# Patient Record
Sex: Female | Born: 1950
Health system: Southern US, Community
[De-identification: ages and names within clinical notes are randomized; demographics above are authoritative.]

## PROBLEM LIST (undated history)

## (undated) DIAGNOSIS — H8109 Meniere's disease, unspecified ear: Secondary | ICD-10-CM

## (undated) DIAGNOSIS — I10 Essential (primary) hypertension: Secondary | ICD-10-CM

## (undated) DIAGNOSIS — E559 Vitamin D deficiency, unspecified: Secondary | ICD-10-CM

## (undated) DIAGNOSIS — F419 Anxiety disorder, unspecified: Secondary | ICD-10-CM

## (undated) DIAGNOSIS — I214 Non-ST elevation (NSTEMI) myocardial infarction: Secondary | ICD-10-CM

## (undated) DIAGNOSIS — R002 Palpitations: Secondary | ICD-10-CM

## (undated) DIAGNOSIS — R202 Paresthesia of skin: Secondary | ICD-10-CM

## (undated) DIAGNOSIS — G4730 Sleep apnea, unspecified: Secondary | ICD-10-CM

## (undated) DIAGNOSIS — Z8669 Personal history of other diseases of the nervous system and sense organs: Secondary | ICD-10-CM

## (undated) DIAGNOSIS — J45909 Unspecified asthma, uncomplicated: Secondary | ICD-10-CM

## (undated) DIAGNOSIS — E039 Hypothyroidism, unspecified: Secondary | ICD-10-CM

## (undated) DIAGNOSIS — R011 Cardiac murmur, unspecified: Secondary | ICD-10-CM

## (undated) DIAGNOSIS — Z872 Personal history of diseases of the skin and subcutaneous tissue: Secondary | ICD-10-CM

## (undated) DIAGNOSIS — E785 Hyperlipidemia, unspecified: Secondary | ICD-10-CM

## (undated) DIAGNOSIS — I341 Nonrheumatic mitral (valve) prolapse: Secondary | ICD-10-CM

## (undated) DIAGNOSIS — K219 Gastro-esophageal reflux disease without esophagitis: Secondary | ICD-10-CM

## (undated) HISTORY — DX: Sleep apnea, unspecified: G47.30

## (undated) HISTORY — PX: APPENDECTOMY: SHX54

## (undated) HISTORY — PX: BREAST LUMPECTOMY: SHX2

## (undated) HISTORY — DX: Essential (primary) hypertension: I10

## (undated) HISTORY — DX: Nonrheumatic mitral (valve) prolapse: I34.1

## (undated) HISTORY — DX: Vitamin D deficiency, unspecified: E55.9

## (undated) HISTORY — DX: Hypothyroidism, unspecified: E03.9

## (undated) HISTORY — DX: Personal history of diseases of the skin and subcutaneous tissue: Z87.2

## (undated) HISTORY — DX: Hyperlipidemia, unspecified: E78.5

## (undated) HISTORY — DX: Personal history of other diseases of the nervous system and sense organs: Z86.69

## (undated) HISTORY — DX: Palpitations: R00.2

## (undated) HISTORY — DX: Non-ST elevation (NSTEMI) myocardial infarction: I21.4

---

## 1998-05-10 ENCOUNTER — Emergency Department (HOSPITAL_COMMUNITY): Admission: EM | Admit: 1998-05-10 | Discharge: 1998-05-10 | Payer: Self-pay | Admitting: Emergency Medicine

## 2000-06-21 ENCOUNTER — Other Ambulatory Visit: Admission: RE | Admit: 2000-06-21 | Discharge: 2000-06-21 | Payer: Self-pay | Admitting: Internal Medicine

## 2001-09-19 ENCOUNTER — Other Ambulatory Visit: Admission: RE | Admit: 2001-09-19 | Discharge: 2001-09-19 | Payer: Self-pay | Admitting: Internal Medicine

## 2003-01-23 ENCOUNTER — Other Ambulatory Visit: Admission: RE | Admit: 2003-01-23 | Discharge: 2003-01-23 | Payer: Self-pay | Admitting: Internal Medicine

## 2004-04-01 ENCOUNTER — Other Ambulatory Visit: Admission: RE | Admit: 2004-04-01 | Discharge: 2004-04-01 | Payer: Self-pay | Admitting: Neurosurgery

## 2004-08-22 ENCOUNTER — Encounter: Admission: RE | Admit: 2004-08-22 | Discharge: 2004-08-22 | Payer: Self-pay | Admitting: Internal Medicine

## 2005-06-02 ENCOUNTER — Ambulatory Visit: Payer: Self-pay | Admitting: Internal Medicine

## 2005-06-16 ENCOUNTER — Ambulatory Visit: Payer: Self-pay | Admitting: Internal Medicine

## 2005-06-16 ENCOUNTER — Other Ambulatory Visit: Admission: RE | Admit: 2005-06-16 | Discharge: 2005-06-16 | Payer: Self-pay | Admitting: Internal Medicine

## 2005-10-19 ENCOUNTER — Ambulatory Visit: Payer: Self-pay | Admitting: Internal Medicine

## 2006-01-18 ENCOUNTER — Ambulatory Visit: Payer: Self-pay | Admitting: Internal Medicine

## 2006-03-18 ENCOUNTER — Ambulatory Visit: Payer: Self-pay | Admitting: Internal Medicine

## 2006-04-15 ENCOUNTER — Ambulatory Visit: Payer: Self-pay | Admitting: Internal Medicine

## 2006-04-29 ENCOUNTER — Ambulatory Visit (HOSPITAL_BASED_OUTPATIENT_CLINIC_OR_DEPARTMENT_OTHER): Admission: RE | Admit: 2006-04-29 | Discharge: 2006-04-29 | Payer: Self-pay | Admitting: Internal Medicine

## 2006-05-15 ENCOUNTER — Ambulatory Visit: Payer: Self-pay | Admitting: Pulmonary Disease

## 2006-05-17 ENCOUNTER — Ambulatory Visit: Payer: Self-pay | Admitting: Internal Medicine

## 2006-07-02 ENCOUNTER — Ambulatory Visit: Payer: Self-pay | Admitting: Internal Medicine

## 2006-09-07 ENCOUNTER — Ambulatory Visit: Payer: Self-pay | Admitting: Internal Medicine

## 2006-09-14 ENCOUNTER — Ambulatory Visit: Payer: Self-pay | Admitting: Internal Medicine

## 2006-09-14 ENCOUNTER — Encounter: Payer: Self-pay | Admitting: Internal Medicine

## 2006-09-14 ENCOUNTER — Other Ambulatory Visit: Admission: RE | Admit: 2006-09-14 | Discharge: 2006-09-14 | Payer: Self-pay | Admitting: Internal Medicine

## 2006-11-09 ENCOUNTER — Ambulatory Visit: Payer: Self-pay | Admitting: Internal Medicine

## 2006-11-09 LAB — CONVERTED CEMR LAB: Free T4: 1.1 ng/dL (ref 0.9–1.8)

## 2006-11-15 ENCOUNTER — Ambulatory Visit: Payer: Self-pay | Admitting: Internal Medicine

## 2007-02-16 ENCOUNTER — Ambulatory Visit: Payer: Self-pay | Admitting: Internal Medicine

## 2007-02-16 LAB — CONVERTED CEMR LAB: TSH: 4.85 microintl units/mL (ref 0.35–5.50)

## 2007-05-19 ENCOUNTER — Ambulatory Visit: Payer: Self-pay | Admitting: Internal Medicine

## 2007-07-08 DIAGNOSIS — J309 Allergic rhinitis, unspecified: Secondary | ICD-10-CM | POA: Insufficient documentation

## 2007-07-08 DIAGNOSIS — E039 Hypothyroidism, unspecified: Secondary | ICD-10-CM | POA: Insufficient documentation

## 2007-07-14 ENCOUNTER — Encounter: Payer: Self-pay | Admitting: Internal Medicine

## 2007-07-21 ENCOUNTER — Ambulatory Visit: Payer: Self-pay | Admitting: Internal Medicine

## 2007-07-21 DIAGNOSIS — H8109 Meniere's disease, unspecified ear: Secondary | ICD-10-CM | POA: Insufficient documentation

## 2007-07-21 DIAGNOSIS — K219 Gastro-esophageal reflux disease without esophagitis: Secondary | ICD-10-CM | POA: Insufficient documentation

## 2007-07-21 LAB — CONVERTED CEMR LAB: TSH: 1.73 microintl units/mL (ref 0.35–5.50)

## 2007-08-10 ENCOUNTER — Telehealth (INDEPENDENT_AMBULATORY_CARE_PROVIDER_SITE_OTHER): Payer: Self-pay | Admitting: *Deleted

## 2007-08-26 ENCOUNTER — Telehealth: Payer: Self-pay | Admitting: Internal Medicine

## 2007-09-16 ENCOUNTER — Ambulatory Visit: Payer: Self-pay | Admitting: Internal Medicine

## 2007-09-16 LAB — CONVERTED CEMR LAB
ALT: 12 units/L (ref 0–35)
AST: 22 units/L (ref 0–37)
Albumin: 3.7 g/dL (ref 3.5–5.2)
CO2: 31 meq/L (ref 19–32)
Calcium: 9 mg/dL (ref 8.4–10.5)
Cholesterol: 174 mg/dL (ref 0–200)
Creatinine, Ser: 0.8 mg/dL (ref 0.4–1.2)
Eosinophils Absolute: 0.3 10*3/uL (ref 0.0–0.6)
Glucose, Bld: 93 mg/dL (ref 70–99)
HCT: 38.5 % (ref 36.0–46.0)
HDL: 43.1 mg/dL (ref >39.0)
Hemoglobin: 13.3 g/dL (ref 12.0–15.0)
Ketones, urine, test strip: NEGATIVE
LDL Cholesterol: 115 mg/dL — ABNORMAL HIGH (ref 0–99)
Lymphocytes Relative: 29.5 % (ref 12.0–46.0)
Neutrophils Relative %: 58.3 % (ref 43.0–77.0)
Nitrite: NEGATIVE
Platelets: 261 10*3/uL (ref 150–400)
Potassium: 4.5 meq/L (ref 3.5–5.1)
Protein, U semiquant: NEGATIVE
RDW: 12.2 % (ref 11.5–14.6)
Sodium: 144 meq/L (ref 135–145)
TSH: 1.73 microintl units/mL (ref 0.35–5.50)
Total Bilirubin: 0.4 mg/dL (ref 0.3–1.2)
VLDL: 16 mg/dL (ref 0–40)
WBC: 5.4 10*3/uL (ref 4.5–10.5)

## 2007-09-23 ENCOUNTER — Other Ambulatory Visit: Admission: RE | Admit: 2007-09-23 | Discharge: 2007-09-23 | Payer: Self-pay | Admitting: Internal Medicine

## 2007-09-23 ENCOUNTER — Ambulatory Visit: Payer: Self-pay | Admitting: Internal Medicine

## 2007-09-23 ENCOUNTER — Encounter: Payer: Self-pay | Admitting: Internal Medicine

## 2007-09-29 ENCOUNTER — Telehealth: Payer: Self-pay | Admitting: *Deleted

## 2007-10-07 ENCOUNTER — Ambulatory Visit: Payer: Self-pay | Admitting: Gastroenterology

## 2007-10-28 ENCOUNTER — Encounter: Payer: Self-pay | Admitting: Internal Medicine

## 2007-10-28 ENCOUNTER — Ambulatory Visit: Payer: Self-pay | Admitting: Gastroenterology

## 2007-12-26 ENCOUNTER — Encounter: Payer: Self-pay | Admitting: Internal Medicine

## 2008-02-08 ENCOUNTER — Ambulatory Visit: Payer: Self-pay | Admitting: Internal Medicine

## 2008-03-20 ENCOUNTER — Ambulatory Visit: Payer: Self-pay | Admitting: Internal Medicine

## 2008-07-16 ENCOUNTER — Encounter: Payer: Self-pay | Admitting: Internal Medicine

## 2008-07-20 ENCOUNTER — Ambulatory Visit: Payer: Self-pay | Admitting: Internal Medicine

## 2008-09-09 ENCOUNTER — Emergency Department (HOSPITAL_COMMUNITY): Admission: EM | Admit: 2008-09-09 | Discharge: 2008-09-09 | Payer: Self-pay | Admitting: Emergency Medicine

## 2008-09-10 ENCOUNTER — Telehealth: Payer: Self-pay | Admitting: Internal Medicine

## 2008-09-25 ENCOUNTER — Ambulatory Visit: Payer: Self-pay | Admitting: Internal Medicine

## 2008-09-25 LAB — CONVERTED CEMR LAB
ALT: 13 units/L (ref 0–35)
Albumin: 3.8 g/dL (ref 3.5–5.2)
BUN: 8 mg/dL (ref 6–23)
Basophils Relative: 0 % (ref 0.0–3.0)
Bilirubin Urine: NEGATIVE
Bilirubin, Direct: 0.1 mg/dL (ref 0.0–0.3)
Blood in Urine, dipstick: NEGATIVE
CO2: 30 meq/L (ref 19–32)
Creatinine, Ser: 0.8 mg/dL (ref 0.4–1.2)
Eosinophils Relative: 6.9 % — ABNORMAL HIGH (ref 0.0–5.0)
GFR calc Af Amer: 95 mL/min
GFR calc non Af Amer: 79 mL/min
Glucose, Bld: 93 mg/dL (ref 70–99)
Lymphocytes Relative: 31.8 % (ref 12.0–46.0)
MCHC: 34 g/dL (ref 30.0–36.0)
MCV: 91.2 fL (ref 78.0–100.0)
Monocytes Absolute: 0.3 10*3/uL (ref 0.1–1.0)
Neutro Abs: 2.5 10*3/uL (ref 1.4–7.7)
Neutrophils Relative %: 54.7 % (ref 43.0–77.0)
Platelets: 238 10*3/uL (ref 150–400)
Protein, U semiquant: NEGATIVE
RBC: 4.45 M/uL (ref 3.87–5.11)
RDW: 12.2 % (ref 11.5–14.6)
Specific Gravity, Urine: 1.02
Total Bilirubin: 0.7 mg/dL (ref 0.3–1.2)
VLDL: 25 mg/dL (ref 0–40)
WBC Urine, dipstick: NEGATIVE

## 2008-10-01 ENCOUNTER — Encounter: Payer: Self-pay | Admitting: Internal Medicine

## 2008-10-01 ENCOUNTER — Other Ambulatory Visit: Admission: RE | Admit: 2008-10-01 | Discharge: 2008-10-01 | Payer: Self-pay | Admitting: Internal Medicine

## 2008-10-01 ENCOUNTER — Ambulatory Visit: Payer: Self-pay | Admitting: Internal Medicine

## 2009-02-05 ENCOUNTER — Encounter: Payer: Self-pay | Admitting: Internal Medicine

## 2009-03-25 ENCOUNTER — Ambulatory Visit: Payer: Self-pay | Admitting: Internal Medicine

## 2009-03-25 LAB — CONVERTED CEMR LAB
Basophils Relative: 0.7 % (ref 0.0–3.0)
HCT: 42.6 % (ref 36.0–46.0)
MCHC: 34 g/dL (ref 30.0–36.0)
Monocytes Absolute: 0.4 10*3/uL (ref 0.1–1.0)
Neutrophils Relative %: 53.9 % (ref 43.0–77.0)
Platelets: 231 10*3/uL (ref 150.0–400.0)
RDW: 12.1 % (ref 11.5–14.6)
TSH: 2.98 microintl units/mL (ref 0.35–5.50)

## 2009-04-03 ENCOUNTER — Ambulatory Visit: Payer: Self-pay | Admitting: Internal Medicine

## 2009-04-12 ENCOUNTER — Telehealth: Payer: Self-pay | Admitting: Internal Medicine

## 2009-07-04 ENCOUNTER — Ambulatory Visit: Payer: Self-pay | Admitting: Internal Medicine

## 2009-07-17 ENCOUNTER — Encounter: Payer: Self-pay | Admitting: Internal Medicine

## 2009-07-29 ENCOUNTER — Encounter: Payer: Self-pay | Admitting: Internal Medicine

## 2009-07-30 ENCOUNTER — Encounter: Payer: Self-pay | Admitting: Internal Medicine

## 2009-08-01 ENCOUNTER — Encounter: Payer: Self-pay | Admitting: Internal Medicine

## 2009-10-03 ENCOUNTER — Ambulatory Visit: Payer: Self-pay | Admitting: Internal Medicine

## 2009-10-03 LAB — CONVERTED CEMR LAB
AST: 20 units/L (ref 0–37)
Bilirubin Urine: NEGATIVE
Blood in Urine, dipstick: NEGATIVE
CO2: 26 meq/L (ref 19–32)
Calcium: 8.7 mg/dL (ref 8.4–10.5)
Chloride: 108 meq/L (ref 96–112)
Eosinophils Relative: 5.4 % — ABNORMAL HIGH (ref 0.0–5.0)
GFR calc non Af Amer: 78.27 mL/min (ref >60)
Glucose, Bld: 92 mg/dL (ref 70–99)
HCT: 38.7 % (ref 36.0–46.0)
Hemoglobin: 13.1 g/dL (ref 12.0–15.0)
Lymphocytes Relative: 28.8 % (ref 12.0–46.0)
Monocytes Absolute: 0.3 10*3/uL (ref 0.1–1.0)
Monocytes Relative: 7.3 % (ref 3.0–12.0)
Neutro Abs: 2.7 10*3/uL (ref 1.4–7.7)
Neutrophils Relative %: 57.4 % (ref 43.0–77.0)
Platelets: 217 10*3/uL (ref 150.0–400.0)
Potassium: 5 meq/L (ref 3.5–5.1)
RBC: 4.03 M/uL (ref 3.87–5.11)
RDW: 12 % (ref 11.5–14.6)
Sodium: 141 meq/L (ref 135–145)
Specific Gravity, Urine: 1.025
Total Bilirubin: 0.6 mg/dL (ref 0.3–1.2)
WBC: 4.6 10*3/uL (ref 4.5–10.5)
pH: 5.5

## 2009-10-10 ENCOUNTER — Encounter: Payer: Self-pay | Admitting: Internal Medicine

## 2009-10-10 ENCOUNTER — Ambulatory Visit: Payer: Self-pay | Admitting: Internal Medicine

## 2009-10-10 ENCOUNTER — Other Ambulatory Visit: Admission: RE | Admit: 2009-10-10 | Discharge: 2009-10-10 | Payer: Self-pay | Admitting: Internal Medicine

## 2009-10-10 LAB — HM MAMMOGRAPHY

## 2009-11-04 ENCOUNTER — Encounter (INDEPENDENT_AMBULATORY_CARE_PROVIDER_SITE_OTHER): Payer: Self-pay | Admitting: *Deleted

## 2010-03-10 ENCOUNTER — Telehealth: Payer: Self-pay | Admitting: Internal Medicine

## 2010-03-10 ENCOUNTER — Ambulatory Visit: Payer: Self-pay | Admitting: Internal Medicine

## 2010-03-10 LAB — CONVERTED CEMR LAB
Free T4: 1.2 ng/dL (ref 0.6–1.6)
TSH: 1.94 microintl units/mL (ref 0.35–5.50)

## 2010-03-12 ENCOUNTER — Telehealth: Payer: Self-pay | Admitting: Internal Medicine

## 2010-03-13 ENCOUNTER — Encounter: Payer: Self-pay | Admitting: Cardiovascular Disease

## 2010-03-13 ENCOUNTER — Ambulatory Visit: Payer: Self-pay

## 2010-04-01 ENCOUNTER — Encounter: Payer: Self-pay | Admitting: Internal Medicine

## 2010-04-02 ENCOUNTER — Telehealth: Payer: Self-pay | Admitting: Internal Medicine

## 2010-04-04 ENCOUNTER — Ambulatory Visit: Payer: Self-pay | Admitting: Family Medicine

## 2010-04-09 ENCOUNTER — Ambulatory Visit: Payer: Self-pay | Admitting: Internal Medicine

## 2010-04-16 ENCOUNTER — Ambulatory Visit: Payer: Self-pay | Admitting: Internal Medicine

## 2010-06-24 ENCOUNTER — Encounter: Payer: Self-pay | Admitting: Internal Medicine

## 2010-07-18 ENCOUNTER — Encounter: Payer: Self-pay | Admitting: Internal Medicine

## 2010-10-13 ENCOUNTER — Ambulatory Visit: Payer: Self-pay | Admitting: Internal Medicine

## 2010-10-13 LAB — CONVERTED CEMR LAB
ALT: 15 U/L
AST: 29 U/L
Albumin: 3.9 g/dL
Alkaline Phosphatase: 76 U/L
BUN: 14 mg/dL
Basophils Absolute: 0.1 K/uL
Basophils Relative: 1.8 %
Bilirubin, Direct: 0.1 mg/dL
Blood in Urine, dipstick: NEGATIVE
CO2: 29 meq/L
Calcium: 9.8 mg/dL
Chloride: 108 meq/L
Cholesterol: 186 mg/dL
Creatinine, Ser: 0.8 mg/dL
Eosinophils Absolute: 0.2 K/uL
Eosinophils Relative: 5.2 % — ABNORMAL HIGH
GFR calc non Af Amer: 75.8 mL/min
Glucose, Bld: 98 mg/dL
HCT: 40.2 %
HDL: 52.6 mg/dL
Hemoglobin: 13.6 g/dL
Ketones, urine, test strip: NEGATIVE
LDL Cholesterol: 117 mg/dL — ABNORMAL HIGH
Lymphocytes Relative: 31.1 %
Lymphs Abs: 1.3 K/uL
MCHC: 33.8 g/dL
MCV: 94 fL
Monocytes Absolute: 0.3 K/uL
Monocytes Relative: 6.9 %
Neutro Abs: 2.2 K/uL
Neutrophils Relative %: 55 %
Platelets: 250 K/uL
Potassium: 6.2 meq/L
RBC: 4.28 M/uL
RDW: 12.3 %
Sodium: 145 meq/L
Specific Gravity, Urine: 1.02
TSH: 0.91 u[IU]/mL
Total Bilirubin: 0.6 mg/dL
Total CHOL/HDL Ratio: 4
Total Protein: 6.6 g/dL
Triglycerides: 83 mg/dL
VLDL: 16.6 mg/dL
WBC: 4.1 10*3/microliter — ABNORMAL LOW

## 2010-10-20 ENCOUNTER — Other Ambulatory Visit: Admission: RE | Admit: 2010-10-20 | Discharge: 2010-10-20 | Payer: Self-pay | Admitting: Internal Medicine

## 2010-10-20 ENCOUNTER — Encounter: Payer: Self-pay | Admitting: Internal Medicine

## 2010-10-20 ENCOUNTER — Ambulatory Visit: Payer: Self-pay | Admitting: Internal Medicine

## 2010-10-20 LAB — HM PAP SMEAR

## 2010-10-24 LAB — CONVERTED CEMR LAB: Pap Smear: NEGATIVE

## 2010-11-03 ENCOUNTER — Telehealth: Payer: Self-pay | Admitting: Internal Medicine

## 2010-12-22 ENCOUNTER — Telehealth: Payer: Self-pay | Admitting: Internal Medicine

## 2011-01-08 NOTE — Progress Notes (Signed)
Summary: lab results  Phone Note Call from Patient   Caller: Patient Call For: Stacie Glaze MD Summary of Call: Pt is asking for lab results. [phone]  Still having palpitations.  Initial call taken by: Lynann Beaver CMA,  March 12, 2010 2:39 PM  Follow-up for Phone Call        per dr Lovell Sheehan- all labs wn l- wants to wait for holter moniotr- make sure pt isnt take decongestant ,pt informed and will set up holter tomorrow Follow-up by: Willy Eddy, LPN,  March 12, 2010 2:50 PM

## 2011-01-08 NOTE — Letter (Signed)
Summary: Letter from patient concerning rash  Letter from patient concerning rash   Imported By: Maryln Gottron 04/04/2010 13:12:24  _____________________________________________________________________  External Attachment:    Type:   Image     Comment:   External Document

## 2011-01-08 NOTE — Assessment & Plan Note (Signed)
Summary: check purple area onaleg/bm   Vital Signs:  Patient profile:   60 year old female Temp:     98.3 degrees F oral BP sitting:   120 / 90  (left arm) Cuff size:   regular  Vitals Entered By: Sid Falcon LPN (April 04, 2010 2:32 PM) CC: reddened raised rash X 10 days, spreading   History of Present Illness: acute visit for pruritic skin rash. First noted erythematous rash symmetric involving upper thigh bilaterally a week ago. Tried antifungal cream and hydrocortisone cream without improvement.  Over the past 2 days has had some areas on her trunk which are blanching, macular, erythematous, nonscaly.  No new changes soaps or detergents. took fluconazole tabs recently without any improvement. denies any systemic symptoms such as fever or chills.  Allergies: 1)  ! Penicillin V Potassium (Penicillin V Potassium) 2)  ! Sulfamethoxazole-Trimethoprim (Sulfamethoxazole-Trimethoprim) 3)  ! Keflex  Past History:  Past Medical History: Last updated: 07/19/2007 Sleep Apnea MVP Hypertension Palpatations Hypothyroidism  Review of Systems      See HPI  Physical Exam  General:  Well-developed,well-nourished,in no acute distress; alert,appropriate and cooperative throughout examination Neck:  No deformities, masses, or tenderness noted. Lungs:  Normal respiratory effort, chest expands symmetrically. Lungs are clear to auscultation, no crackles or wheezes. Heart:  Normal rate and regular rhythm. S1 and S2 normal without gallop, murmur, click, rub or other extra sounds. Skin:  patient has scattered areas of rash over her trunk and upper inner thigh. Trunk rash involves the left lower abdomen and left lower to mid back region. All areas of rash are blanching macular erythematous and non-scaly and nontender.   Impression & Recommendations:  Problem # 1:  DERMATITIS (ICD-692.9) ?contact.  diffuse involvement and topical not effective.  Discussed options and pt consents to trial of  IM steroids after reviewing risks and benefits. Orders: Depo- Medrol 80mg  (J1040) Admin of Therapeutic Inj  intramuscular or subcutaneous ([address])  Complete Medication List: 1)  Synthroid 75 Mcg Tabs (Levothyroxine sodium) .... Take 1 tablet by mouth once a day 2)  Caltrate 600+d 600-400 Mg-unit Tabs (Calcium carbonate-vitamin d) .... Two times a day 3)  Nasonex 50 Mcg/act Susp (Mometasone furoate) .... Two times a day as needed 4)  Astelin 137 Mcg/spray Soln (Azelastine hcl) .... As needed 5)  Toprol Xl 100 Mg Tb24 (Metoprolol succinate) .... Take 1 tablet by mouth once a day 6)  Protonix 40 Mg Tbec (Pantoprazole sodium) .... One by mouth daily 7)  Valium 5 Mg Tabs (Diazepam) .... One by mouth every 6 hours as needed dizzyness 8)  Fluconazole 100 Mg Tabs (Fluconazole) .Marland Kitchen.. 1 once daily for 14 days  Patient Instructions: 1)  Touch base with Dr. Lovell Sheehan by next week if rash not resolving   Medication Administration  Injection # 1:    Medication: Depo- Medrol 80mg     Diagnosis: DERMATITIS (ICD-692.9)    Route: IM    Site: LUOQ gluteus    Exp Date: 10/07/2012    Lot #: [id number]    Mfr: Pharmacia    Patient tolerated injection without complications    Given by: Sid Falcon LPN (April 04, 2010 3:55 PM)  Orders Added: 1)  Depo- Medrol 80mg  [J1040] 2)  Admin of Therapeutic Inj  intramuscular or subcutaneous [96372] 3)  Est. Patient Level III [[address]11]

## 2011-01-08 NOTE — Progress Notes (Signed)
Summary: Palpatations  Phone Note Call from Patient Call back at [phone]   Caller: Patient Reason for Call: Acute Illness, Privacy/Consent Authorization Summary of Call: Patient has mitral valve prolapse - states she is now having palpatations.  Patient is taking Toprol but is wondering if there is anything else she can take to calm palpatations down.  Initial call taken by: Everrett Coombe,  March 10, 2010 8:41 AM  Follow-up for Phone Call        per er Katriona Schmierer- tsh,t3,t4, today and set up for  holter monitor- encourage to take valium as needed for anxiety- pt informed and order sent in and lab ordered Follow-up by: Willy Eddy, LPN,  March 10, 2010 9:00 AM

## 2011-01-08 NOTE — Progress Notes (Signed)
  Phone Note Call from Patient   Caller: Patient Call For: Tracey Glaze MD Summary of Call: Biaxin 500 mg....Marland KitchenMarland KitchenCrown at the dentist, and needs refill. [phone] [facility] Initial call taken by: Lynann Beaver CMA AAMA,  December 22, 2010 8:06 AM    New/Updated Medications: CLARITHROMYCIN 500 MG TABS (CLARITHROMYCIN) take 2 1 hour prior to dental work Prescriptions: CLARITHROMYCIN 500 MG TABS (CLARITHROMYCIN) take 2 1 hour prior to dental work  #6 x 1   Entered by:   Willy Eddy, LPN   Authorized by:   Tracey Glaze MD   Signed by:   Willy Eddy, LPN on 29/56/2130   Method used:   Electronically to        [facility]  [address] (retail)       [address]       [place], [place]  [address]       Ph: [phone]       Fax: [phone]   RxID:   [id number]

## 2011-01-08 NOTE — Progress Notes (Signed)
  Phone Note Call from Patient   Summary of Call: pt sent n ote stating she had started with vaginal yeast infection adn tx with otc monistat, but now had rash with itching all over body.  per dr Lovell Sheehan give diflucan 100 1 once daily for 14 days Initial call taken by: Willy Eddy, LPN,  April 02, 2010 8:38 AM    New/Updated Medications: FLUCONAZOLE 100 MG TABS (FLUCONAZOLE) 1 once daily for 14 days Prescriptions: FLUCONAZOLE 100 MG TABS (FLUCONAZOLE) 1 once daily for 14 days  #14 x 0   Entered by:   Willy Eddy, LPN   Authorized by:   Stacie Glaze MD   Signed by:   Willy Eddy, LPN on 42/59/5638   Method used:   Electronically to        [facility]  [address] (retail)       [address]       [place], [place]  [address]       Ph: [phone]       Fax: [phone]   RxID:   [id number]   Appended Document:  Pt wants to see Dr. Lovell Sheehan about purple area on leg. [phone]  Appended Document:  ob given with dr Caryl Never tomrrow

## 2011-01-08 NOTE — Assessment & Plan Note (Signed)
Summary: cpx/njr   Vital Signs:  Patient profile:   60 year old female Menstrual status:  postmenopausal LMP:     10/12/2005 Height:      67 inches Weight:      140 pounds BMI:     22.01 Temp:     98.2 degrees F oral Pulse rate:   84 / minute Resp:     12 per minute BP sitting:   118 / 80  Vitals Entered By: Lynann Beaver CMA AAMA (October 20, 2010 8:37 AM) CC: /pap Is Patient Diabetic? No Pain Assessment Patient in pain? no      LMP (date): 10/12/2005     Menstrual Status postmenopausal Enter LMP: 10/12/2005 Last PAP Result normal   CC:  /pap.  Preventive Screening-Counseling & Management  Alcohol-Tobacco     Smoking Status: never     Tobacco Counseling: not indicated; no tobacco use  Problems Prior to Update: 1)  Hyperkalemia  (ICD-276.7) 2)  Dermatitis  (ICD-692.9) 3)  Bursitis, Left Shoulder  (ICD-726.10) 4)  Menopausal Syndrome  (ICD-627.2) 5)  Physical Examination  (ICD-V70.0) 6)  Gastroesophageal Reflux Disease, Severe  (ICD-530.81) 7)  Meniere's Disease  (ICD-386.00) 8)  Migraine Headache  (ICD-346.90) 9)  Allergic Rhinitis  (ICD-477.9) 10)  Mitral Valve Disorder  (ICD-424.0) 11)  Sleep Apnea  (ICD-780.57) 12)  Hypothyroidism  (ICD-244.9) 13)  Hypertension  (ICD-401.9)  Medications Prior to Update: 1)  Synthroid 75 Mcg  Tabs (Levothyroxine Sodium) .... Take 1 Tablet By Mouth Once A Day 2)  Caltrate 600+d 600-400 Mg-Unit  Tabs (Calcium Carbonate-Vitamin D) .... Two Times A Day 3)  Fluticasone Propionate 50 Mcg/act Susp (Fluticasone Propionate) .... Two Spray in Each Nostril Daily 4)  Toprol Xl 100 Mg  Tb24 (Metoprolol Succinate) .... Take 1 Tablet By Mouth Once A Day 5)  Nexium 40 Mg Cpdr (Esomeprazole Magnesium) .... One By Mouth Daily 6)  Valium 5 Mg  Tabs (Diazepam) .... One By Mouth Every 6 Hours As Needed Dizzyness  Current Medications (verified): 1)  Synthroid 75 Mcg  Tabs (Levothyroxine Sodium) .... Take 1 Tablet By Mouth Once A Day 2)   Caltrate 600+d 600-400 Mg-Unit  Tabs (Calcium Carbonate-Vitamin D) .... Two Times A Day 3)  Fluticasone Propionate 50 Mcg/act Susp (Fluticasone Propionate) .... Two Spray in Each Nostril Daily 4)  Toprol Xl 100 Mg  Tb24 (Metoprolol Succinate) .... Take 1 Tablet By Mouth Once A Day 5)  Nexium 40 Mg Cpdr (Esomeprazole Magnesium) .... One By Mouth Daily 6)  Valium 5 Mg  Tabs (Diazepam) .... One By Mouth Every 6 Hours As Needed Dizzyness  Allergies (verified): 1)  ! Penicillin V Potassium (Penicillin V Potassium) 2)  ! Sulfamethoxazole-Trimethoprim (Sulfamethoxazole-Trimethoprim) 3)  ! Keflex  Past History:  Family History: Last updated: 21-Mar-2008 father: died form melanoma Family History of Stroke F 1st degree relative <60 mother and uncle  Social History: Last updated: 07/19/2007 Occupation: [employer] Married Never Smoked  Risk Factors: Smoking Status: never (10/20/2010) Passive Smoke Exposure: no (10/01/2008)  Past medical, surgical, family and social histories (including risk factors) reviewed, and no changes noted (except as noted below).  Past Medical History: Reviewed history from 07/19/2007 and no changes required. Sleep Apnea MVP Hypertension Palpatations Hypothyroidism  Past Surgical History: Reviewed history from 07/21/2007 and no changes required. Appendectomy Lumpectomy  Family History: Reviewed history from 03-21-2008 and no changes required. father: died form melanoma Family History of Stroke F 1st degree relative <60 mother and uncle  Social History: Reviewed  history from 07/19/2007 and no changes required. Occupation: [employer] Married Never Smoked  Review of Systems  The patient denies anorexia, fever, weight loss, weight gain, vision loss, decreased hearing, hoarseness, chest pain, syncope, dyspnea on exertion, peripheral edema, prolonged cough, headaches, hemoptysis, abdominal pain, melena, hematochezia, severe indigestion/heartburn, hematuria,  incontinence, genital sores, muscle weakness, suspicious skin lesions, transient blindness, difficulty walking, depression, unusual weight change, abnormal bleeding, enlarged lymph nodes, angioedema, and breast masses.    Physical Exam  General:  Well-developed,well-nourished,in no acute distress; alert,appropriate and cooperative throughout examination Head:  normocephalic and atraumatic.   Eyes:  pupils equal, pupils round, and nystagmus.   Ears:  R ear normal and L ear normal.   Mouth:  good dentition and pharynx pink and moist.   Neck:  No deformities, masses, or tenderness noted. Chest Wall:  No deformities, masses, or tenderness noted. Lungs:  Normal respiratory effort, chest expands symmetrically. Lungs are clear to auscultation, no crackles or wheezes. Heart:  normal rate, regular rhythm, and systolic click.   Abdomen:  Bowel sounds positive,abdomen soft and non-tender without masses, organomegaly or hernias noted. Msk:  normal ROM and no joint tenderness.   Pulses:  R and L carotid,radial,femoral,dorsalis pedis and posterior tibial pulses are full and equal bilaterally Extremities:  No clubbing, cyanosis, edema, or deformity noted with normal full range of motion of all joints.   Neurologic:  No cranial nerve deficits noted. Station and gait are normal. Plantar reflexes are down-going bilaterally. DTRs are symmetrical throughout. Sensory, motor and coordinative functions appear intact.   Impression & Recommendations:  Problem # 1:  PHYSICAL EXAMINATION (ICD-V70.0) The pt was asked about all immunizations, health maint. services that are appropriate to their age and was given guidance on diet exercize  and weight management  pap performed Mammogram: normal (10/10/2009) Pap smear: normal (10/10/2009) Colonoscopy: normal (10/06/2007) Td Booster: Tdap (09/23/2007)   Flu Vax: Historical (10/10/2009)   Chol: 186 (10/13/2010)   HDL: 52.60 (10/13/2010)   LDL: 117 (10/13/2010)   TG: 83.0  (10/13/2010) TSH: 0.91 (10/13/2010)   Next mammogram due:: 10/2010 (10/10/2009) Next Colonoscopy due:: 10/2017 (10/10/2009)  Discussed using sunscreen, use of alcohol, drug use, self breast exam, routine dental care, routine eye care, schedule for GYN exam, routine physical exam, seat belts, multiple vitamins, osteoporosis prevention, adequate calcium intake in diet, recommendations for immunizations, mammograms and Pap smears.  Discussed exercise and checking cholesterol.  Discussed gun safety, safe sex, and contraception.  Problem # 2:  MENIERE'S DISEASE (ICD-386.00) mild flair  Problem # 3:  HYPERKALEMIA (ICD-276.7) Assessment: Unchanged  possble lab error  Orders: Venipuncture ([address]) Specimen Handling (60[address]) TLB-Potassium (K+) (84132-K)  Complete Medication List: 1)  Synthroid 75 Mcg Tabs (Levothyroxine sodium) .... Take 1 tablet by mouth once a day 2)  Caltrate 600+d 600-400 Mg-unit Tabs (Calcium carbonate-vitamin d) .... Two times a day 3)  Fluticasone Propionate 50 Mcg/act Susp (Fluticasone propionate) .... Two spray in each nostril daily 4)  Toprol Xl 100 Mg Tb24 (Metoprolol succinate) .... Take 1 tablet by mouth once a day 5)  Nexium 40 Mg Cpdr (Esomeprazole magnesium) .... One by mouth daily 6)  Valium 5 Mg Tabs (Diazepam) .... One by mouth every 6 hours as needed dizzyness  Patient Instructions: 1)  Please schedule a follow-up appointment in 6 months. 2)  TSH prior to visit, ICD-9:244.9   Orders Added: 1)  Est. Patient 40-64 years [99396] 2)  Venipuncture [09811] 3)  Specimen Handling [99000] 4)  TLB-Potassium (K+) [91478-G]

## 2011-01-08 NOTE — Assessment & Plan Note (Signed)
Summary: 6 MONTH ROV./NJR   Vital Signs:  Patient profile:   60 year old female Height:      67 inches Weight:      138 pounds BMI:     21.69 Temp:     98.2 degrees F oral Pulse rate:   72 / minute Resp:     14 per minute BP sitting:   124 / 80  (left arm)  Vitals Entered By: Willy Eddy, LPN (Apr 16, 2010 8:54 AM) CC: roa labs- palpatations are less now, but she continues to have them, Hypertension Management   CC:  roa labs- palpatations are less now, but she continues to have them, and Hypertension Management.  History of Present Illness: flow up hypothyroidsim and meniere's  Follow-Up Visit      This is a 60 year old woman who presents for Follow-up visit.  The patient denies chest pain, palpitations, dizziness, syncope, low blood sugar symptoms, high blood sugar symptoms, edema, SOB, DOE, PND, and orthopnea.  Since the last visit the patient notes no new problems or concerns.  The patient reports taking meds as prescribed and monitoring BP.  When questioned about possible medication side effects, the patient notes fatigue and depressive symptoms.    Hypertension History:      She complains of neurologic problems, but denies headache, chest pain, palpitations, dyspnea with exertion, orthopnea, PND, peripheral edema, visual symptoms, syncope, and side effects from treatment.  dizzy due to other diagnosis.        Positive major cardiovascular risk factors include female age 53 years old or older and hypertension.  Negative major cardiovascular risk factors include negative family history for ischemic heart disease and non-tobacco-user status.     Habits & Providers  Alcohol-Tobacco-Diet     Tobacco Status: never  Problems Prior to Update: 1)  Dermatitis  (ICD-692.9) 2)  Bursitis, Left Shoulder  (ICD-726.10) 3)  Menopausal Syndrome  (ICD-627.2) 4)  Physical Examination  (ICD-V70.0) 5)  Gastroesophageal Reflux Disease, Severe  (ICD-530.81) 6)  Meniere's Disease   (ICD-386.00) 7)  Migraine Headache  (ICD-346.90) 8)  Allergic Rhinitis  (ICD-477.9) 9)  Mitral Valve Disorder  (ICD-424.0) 10)  Sleep Apnea  (ICD-780.57) 11)  Hypothyroidism  (ICD-244.9) 12)  Hypertension  (ICD-401.9)  Current Problems (verified): 1)  Dermatitis  (ICD-692.9) 2)  Bursitis, Left Shoulder  (ICD-726.10) 3)  Menopausal Syndrome  (ICD-627.2) 4)  Physical Examination  (ICD-V70.0) 5)  Gastroesophageal Reflux Disease, Severe  (ICD-530.81) 6)  Meniere's Disease  (ICD-386.00) 7)  Migraine Headache  (ICD-346.90) 8)  Allergic Rhinitis  (ICD-477.9) 9)  Mitral Valve Disorder  (ICD-424.0) 10)  Sleep Apnea  (ICD-780.57) 11)  Hypothyroidism  (ICD-244.9) 12)  Hypertension  (ICD-401.9)  Medications Prior to Update: 1)  Synthroid 75 Mcg  Tabs (Levothyroxine Sodium) .... Take 1 Tablet By Mouth Once A Day 2)  Caltrate 600+d 600-400 Mg-Unit  Tabs (Calcium Carbonate-Vitamin D) .... Two Times A Day 3)  Nasonex 50 Mcg/act  Susp (Mometasone Furoate) .... Two Times A Day As Needed 4)  Astelin 137 Mcg/spray  Soln (Azelastine Hcl) .... As Needed 5)  Toprol Xl 100 Mg  Tb24 (Metoprolol Succinate) .... Take 1 Tablet By Mouth Once A Day 6)  Protonix 40 Mg  Tbec (Pantoprazole Sodium) .... One By Mouth Daily 7)  Valium 5 Mg  Tabs (Diazepam) .... One By Mouth Every 6 Hours As Needed Dizzyness 8)  Fluconazole 100 Mg Tabs (Fluconazole) .Marland Kitchen.. 1 Once Daily For 14 Days  Current Medications (  verified): 1)  Synthroid 75 Mcg  Tabs (Levothyroxine Sodium) .... Take 1 Tablet By Mouth Once A Day 2)  Caltrate 600+d 600-400 Mg-Unit  Tabs (Calcium Carbonate-Vitamin D) .... Two Times A Day 3)  Fluticasone Propionate 50 Mcg/act Susp (Fluticasone Propionate) .... Two Spray in Each Nostril Daily 4)  Toprol Xl 100 Mg  Tb24 (Metoprolol Succinate) .... Take 1 Tablet By Mouth Once A Day 5)  Nexium 40 Mg Cpdr (Esomeprazole Magnesium) .... One By Mouth Daily 6)  Valium 5 Mg  Tabs (Diazepam) .... One By Mouth Every 6 Hours As  Needed Dizzyness  Allergies (verified): 1)  ! Penicillin V Potassium (Penicillin V Potassium) 2)  ! Sulfamethoxazole-Trimethoprim (Sulfamethoxazole-Trimethoprim) 3)  ! Keflex  Past History:  Family History: Last updated: 03-29-2008 father: died form melanoma Family History of Stroke F 1st degree relative <60 mother and uncle  Social History: Last updated: 07/19/2007 Occupation: [employer] Married Never Smoked  Risk Factors: Smoking Status: never (04/16/2010) Passive Smoke Exposure: no (10/01/2008)  Past medical, surgical, family and social histories (including risk factors) reviewed, and no changes noted (except as noted below).  Past Medical History: Reviewed history from 07/19/2007 and no changes required. Sleep Apnea MVP Hypertension Palpatations Hypothyroidism  Past Surgical History: Reviewed history from 07/21/2007 and no changes required. Appendectomy Lumpectomy  Family History: Reviewed history from 2008-03-29 and no changes required. father: died form melanoma Family History of Stroke F 1st degree relative <60 mother and uncle  Social History: Reviewed history from 07/19/2007 and no changes required. Occupation: [employer] Married Never Smoked  Physical Exam  General:  Well-developed,well-nourished,in no acute distress; alert,appropriate and cooperative throughout examination Head:  normocephalic and atraumatic.   Ears:  R ear normal and L ear normal.   Nose:  no external deformity and nasal dischargemucosal pallor.   Mouth:  good dentition and pharynx pink and moist.   Neck:  No deformities, masses, or tenderness noted. Lungs:  Normal respiratory effort, chest expands symmetrically. Lungs are clear to auscultation, no crackles or wheezes. Heart:  normal rate, regular rhythm, and systolic click.     Impression & Recommendations:  Problem # 1:  MENIERE'S DISEASE (ICD-386.00) the pt is on low salt and toprol for the palpitaions  Problem # 2:  MITRAL  VALVE DISORDER (ICD-424.0) stable Her updated medication list for this problem includes:    Toprol Xl 100 Mg Tb24 (Metoprolol succinate) .Marland Kitchen... Take 1 tablet by mouth once a day  Problem # 3:  HYPOTHYROIDISM (ICD-244.9) Assessment: Unchanged  Her updated medication list for this problem includes:    Synthroid 75 Mcg Tabs (Levothyroxine sodium) .Marland Kitchen... Take 1 tablet by mouth once a day  Labs Reviewed: TSH: 1.67 (04/09/2010)    Chol: 165 (10/03/2009)   HDL: 50.00 (10/03/2009)   LDL: 103 (10/03/2009)   TG: 59.0 (10/03/2009)  Problem # 4:  ALLERGIC RHINITIS (ICD-477.9)  The following medications were removed from the medication list:    Astelin 137 Mcg/spray Soln (Azelastine hcl) .Marland Kitchen... As needed Her updated medication list for this problem includes:    Fluticasone Propionate 50 Mcg/act Susp (Fluticasone propionate) .Marland Kitchen..Marland Kitchen Two spray in each nostril daily  Discussed use of allergy medications and environmental measures.   Complete Medication List: 1)  Synthroid 75 Mcg Tabs (Levothyroxine sodium) .... Take 1 tablet by mouth once a day 2)  Caltrate 600+d 600-400 Mg-unit Tabs (Calcium carbonate-vitamin d) .... Two times a day 3)  Fluticasone Propionate 50 Mcg/act Susp (Fluticasone propionate) .... Two spray in each nostril daily 4)  Toprol Xl 100 Mg Tb24 (Metoprolol succinate) .... Take 1 tablet by mouth once a day 5)  Nexium 40 Mg Cpdr (Esomeprazole magnesium) .... One by mouth daily 6)  Valium 5 Mg Tabs (Diazepam) .... One by mouth every 6 hours as needed dizzyness  Hypertension Assessment/Plan:      The patient's hypertensive risk group is category B: At least one risk factor (excluding diabetes) with no target organ damage.  Her calculated 10 year risk of coronary heart disease is 8 %.  Today's blood pressure is 124/80.  Her blood pressure goal is < 140/90.  Patient Instructions: 1)  Oct FOR CPX Prescriptions: NEXIUM 40 MG CPDR (ESOMEPRAZOLE MAGNESIUM) one by mouth daily  #30 x 11    Entered and Authorized by:   Stacie Glaze MD   Signed by:   Stacie Glaze MD on 04/16/2010   Method used:   Electronically to        [facility]  [address] (retail)       [address]       [place], [place]  [address]       Ph: [phone]       Fax: [phone]   RxID:   [id number] FLUTICASONE PROPIONATE 50 MCG/ACT SUSP (FLUTICASONE PROPIONATE) two spray in each nostril daily  #1 x 11   Entered and Authorized by:   Stacie Glaze MD   Signed by:   Stacie Glaze MD on 04/16/2010   Method used:   Electronically to        [facility]  [address] (retail)       [address]       [place], [place]  [address]       Ph: [phone]       Fax: [phone]   RxID:   [id number]

## 2011-01-08 NOTE — Progress Notes (Signed)
Summary: K results.  Phone Note Call from Patient   Caller: Patient Call For: Stacie Glaze MD Reason for Call: Acute Illness Summary of Call: Nicolette Bang [facility]) Calling for results of K [phone]  Initial call taken by: [facility] CMA AAMA,  November 03, 2010 11:20 AM  Follow-up for Phone Call        pt informed normal Follow-up by: Willy Eddy, LPN,  November 03, 2010 11:31 AM

## 2011-01-08 NOTE — Procedures (Signed)
Summary: summary report  summary report   Imported By: Mirna Mires 03/18/2010 11:21:07  _____________________________________________________________________  External Attachment:    Type:   Image     Comment:   External Document

## 2011-02-19 ENCOUNTER — Other Ambulatory Visit: Payer: Self-pay | Admitting: *Deleted

## 2011-02-19 MED ORDER — DIAZEPAM 5 MG PO TABS: 5.0000 mg | ORAL_TABLET | Freq: Three times a day (TID) | ORAL | Status: DC | PRN

## 2011-04-13 ENCOUNTER — Other Ambulatory Visit (INDEPENDENT_AMBULATORY_CARE_PROVIDER_SITE_OTHER): Payer: BC Managed Care – PPO | Admitting: Internal Medicine

## 2011-04-13 DIAGNOSIS — E039 Hypothyroidism, unspecified: Secondary | ICD-10-CM

## 2011-04-14 ENCOUNTER — Encounter: Payer: Self-pay | Admitting: Internal Medicine

## 2011-04-20 ENCOUNTER — Encounter: Payer: Self-pay | Admitting: Internal Medicine

## 2011-04-20 ENCOUNTER — Ambulatory Visit (INDEPENDENT_AMBULATORY_CARE_PROVIDER_SITE_OTHER): Payer: BC Managed Care – PPO | Admitting: Internal Medicine

## 2011-04-20 VITALS — BP 110/70 | HR 68 | Temp 98.2°F | Resp 14 | Ht 68.0 in | Wt 145.0 lb

## 2011-04-20 DIAGNOSIS — E039 Hypothyroidism, unspecified: Secondary | ICD-10-CM

## 2011-04-20 DIAGNOSIS — H8109 Meniere's disease, unspecified ear: Secondary | ICD-10-CM

## 2011-04-20 DIAGNOSIS — I10 Essential (primary) hypertension: Secondary | ICD-10-CM

## 2011-04-20 DIAGNOSIS — I059 Rheumatic mitral valve disease, unspecified: Secondary | ICD-10-CM

## 2011-04-20 MED ORDER — LEVOTHYROXINE SODIUM 75 MCG PO TABS: 75.0000 ug | ORAL_TABLET | Freq: Every day | ORAL | Status: DC

## 2011-04-20 MED ORDER — METOPROLOL SUCCINATE ER 100 MG PO TB24: 100.0000 mg | ORAL_TABLET | Freq: Every day | ORAL | Status: DC

## 2011-04-20 MED ORDER — DIGOXIN 125 MCG PO TABS: ORAL_TABLET | ORAL | Status: DC

## 2011-04-20 NOTE — Patient Instructions (Signed)
The medicine should be taken before bed and is a half a tablet of Lanoxin.

## 2011-04-20 NOTE — Progress Notes (Signed)
Subjective:    Patient ID: Tracey Giles, female    DOB: 24-Nov-1951, 60 y.o.   MRN: [id number]  HPI This is a 60 year old white female who presents for hypothyroidism a history of irregular heartbeat hypertension and a history of mitral valve prolapse.  She states her palpitations have slightly worsened she denies any increased stress or emotional issues at this time he states the palpitations are hardest in the morning.  He denies any shortness of breath or dizziness associated with these palpitations. This is been a chronic finding she does have echo evidence for mitral valve disease.     Review of Systems  Constitutional: Negative for activity change, appetite change and fatigue.  HENT: Negative for ear pain, congestion, neck pain, postnasal drip and sinus pressure.   Eyes: Negative for redness and visual disturbance.  Respiratory: Negative for cough, shortness of breath and wheezing.   Cardiovascular: Positive for palpitations.  Gastrointestinal: Negative for abdominal pain and abdominal distention.  Genitourinary: Negative for dysuria, frequency and menstrual problem.  Musculoskeletal: Negative for myalgias, joint swelling and arthralgias.  Skin: Negative for rash and wound.  Neurological: Negative for dizziness, weakness and headaches.  Hematological: Negative for adenopathy. Does not bruise/bleed easily.  Psychiatric/Behavioral: Negative for sleep disturbance and decreased concentration.   Past Medical History  Diagnosis Date  . Sleep apnea   . MVP (mitral valve prolapse)   . Hypertension   . Thyroid disease   . Irregular heart beat    Past Surgical History  Procedure Date  . Appendectomy   . Breast lumpectomy   . Melanoma excision     reports that she has never smoked. She does not have any smokeless tobacco history on file. She reports that she does not drink alcohol or use illicit drugs. family history includes Cancer in her father; Melanoma in her father;  and Stroke in her maternal grandfather and mother. Allergies  Allergen Reactions  . Cephalexin   . Penicillins     REACTION: unspecified  . Sulfamethoxazole W/Trimethoprim     REACTION: unspecified       Objective:   Physical Exam  Constitutional: She is oriented to person, place, and time. She appears well-developed and well-nourished. No distress.  HENT:  Head: Normocephalic and atraumatic.  Right Ear: External ear normal.  Left Ear: External ear normal.  Nose: Nose normal.  Mouth/Throat: Oropharynx is clear and moist.  Eyes: Conjunctivae and EOM are normal. Pupils are equal, round, and reactive to light.  Neck: Normal range of motion. Neck supple. No JVD present. No tracheal deviation present. No thyromegaly present.  Cardiovascular: Normal rate, regular rhythm, normal heart sounds and intact distal pulses.   No murmur heard.      Midsystolic click  Pulmonary/Chest: Effort normal and breath sounds normal. She has no wheezes. She exhibits no tenderness.  Abdominal: Soft. Bowel sounds are normal.  Musculoskeletal: Normal range of motion. She exhibits no edema and no tenderness.  Lymphadenopathy:    She has no cervical adenopathy.  Neurological: She is alert and oriented to person, place, and time. She has normal reflexes. No cranial nerve deficit.  Skin: Skin is warm and dry. She is not diaphoretic.  Psychiatric: She has a normal mood and affect. Her behavior is normal.          Assessment & Plan:  This is a history of mitral valve prolapse she is worsening palpitations.  We will try a very low dose of digoxin to see if we can eliminate  some of the symptoms of palpitations these persist will again pursue a Holter monitor.  Holter monitoring the past has not been able to detect any sustained atrial arrhythmia however she definitely at risk for PAT.  Her thyroid dose is currently adequate and her TSH is at goal she is a history of potassium abnormalities have been that  should be monitored. She has a history of Mnire's disease is difficult to assess whether or not some of her sense of palpitations is from her Mnire's. She also has a history of sleep apnea

## 2011-04-24 NOTE — Procedures (Signed)
NAME:  Tracey Giles, Tracey Giles         ACCOUNT NO.:  [id number]   MEDICAL RECORD NO.:  [id number]          PATIENT TYPE:  OUT   LOCATION:  SLEEP CENTER                 FACILITY:  [facility]   PHYSICIAN:  Marcelyn Bruins, M.D. [facility] DATE OF BIRTH:  1951-05-11   DATE OF STUDY:  04/29/2006                              NOCTURNAL POLYSOMNOGRAM   REFERRING PHYSICIAN:  Dr. Darryll Capers   INDICATION FOR STUDY:  Hypersomnia with sleep apnea.   EPWORTH SCORE:  13.   SLEEP ARCHITECTURE:  The patient had a total sleep time of 220 minutes with  significantly decreased REM and slow wave sleep.  Sleep onset latency was  prolonged at 64 minutes and REM onset was prolonged at 254 minutes.  Sleep  efficiency was severely decreased at 51%.   RESPIRATORY DATA:  The patient was found to have 64 hypopneas and 9 apneas  for a respiratory disturbance index of 20 events per hour.  The events were  not positional, but there was mild snoring noted throughout.   OXYGEN DATA.:  There was O2 desaturation as low as 90% with the patient's  obstructive events.   CARDIAC DATA:  No clinically significant cardiac arrhythmias.   MOVEMENT/PARASOMNIAS:  Small numbers of leg jerks with no clinically  significant impact.   IMPRESSION/RECOMMENDATIONS:  1.  Moderate obstructive sleep/hypopnea syndrome with a respiratory      disturbance index of 21 events per hour and O2 desaturation as low as      90%.  Treatment for this degree of sleep apnea can include weight loss      alone if applicable, upper airway surgery, oral appliance, and also      CPAP.  Clinical correlation is suggested.           ______________________________  Marcelyn Bruins, M.D. [facility]  Diplomate, American Board of Sleep  Medicine     KC/MEDQ  D:  05/17/2006 12:44:27  T:  05/17/2006 15:53:12  Job:  [id number]

## 2011-05-14 ENCOUNTER — Other Ambulatory Visit (INDEPENDENT_AMBULATORY_CARE_PROVIDER_SITE_OTHER): Payer: BC Managed Care – PPO

## 2011-05-14 DIAGNOSIS — R002 Palpitations: Secondary | ICD-10-CM

## 2011-05-15 LAB — DIGOXIN LEVEL: Digoxin Level: 0.5 ng/mL — ABNORMAL LOW (ref 0.8–2.0)

## 2011-05-28 ENCOUNTER — Other Ambulatory Visit: Payer: Self-pay | Admitting: Internal Medicine

## 2011-06-24 ENCOUNTER — Ambulatory Visit (INDEPENDENT_AMBULATORY_CARE_PROVIDER_SITE_OTHER): Payer: BC Managed Care – PPO | Admitting: Internal Medicine

## 2011-06-24 ENCOUNTER — Encounter: Payer: Self-pay | Admitting: Internal Medicine

## 2011-06-24 VITALS — BP 130/80 | HR 68 | Temp 98.2°F | Resp 14 | Ht 68.0 in | Wt 148.0 lb

## 2011-06-24 DIAGNOSIS — R350 Frequency of micturition: Secondary | ICD-10-CM

## 2011-06-24 DIAGNOSIS — I059 Rheumatic mitral valve disease, unspecified: Secondary | ICD-10-CM

## 2011-06-24 DIAGNOSIS — R002 Palpitations: Secondary | ICD-10-CM

## 2011-06-24 LAB — POCT URINALYSIS DIPSTICK
Bilirubin, UA: NEGATIVE
Blood, UA: NEGATIVE
Nitrite, UA: NEGATIVE
Spec Grav, UA: 1.025
pH, UA: 5.5

## 2011-06-24 NOTE — Progress Notes (Signed)
Subjective:    Patient ID: Tracey Giles, female    DOB: 02/19/1951, 60 y.o.   MRN: [id number]  HPI patient presents with a history of palpitations have become so severe that they were affecting her day-to-day activities.  She did not tolerate increased increasing beta blockers due to fatigue and she does have a history of mild mitral valve prolapse.  We initiated a trial of low-dose digoxin one half of a 0.125 mg dose achieving a level of 0.5 and the serum with significant resolution of her symptomatology and fatigue.  She did however during treatment developed urinary frequency she stated that she awoke in between 5 and 8 times last night to urinate and we believe the urinary frequency may be a result of a urinary tract infection    Review of Systems  Constitutional: Negative for activity change, appetite change and fatigue.  HENT: Negative for ear pain, congestion, neck pain, postnasal drip and sinus pressure.   Eyes: Negative for redness and visual disturbance.  Respiratory: Negative for cough, shortness of breath and wheezing.   Gastrointestinal: Negative for abdominal pain and abdominal distention.  Genitourinary: Positive for urgency and frequency. Negative for dysuria and menstrual problem.  Musculoskeletal: Negative for myalgias, joint swelling and arthralgias.  Skin: Negative for rash and wound.  Neurological: Negative for dizziness, weakness and headaches.  Hematological: Negative for adenopathy. Does not bruise/bleed easily.  Psychiatric/Behavioral: Negative for sleep disturbance and decreased concentration.   Past Medical History  Diagnosis Date  . Sleep apnea   . MVP (mitral valve prolapse)   . Hypertension   . Thyroid disease   . Irregular heart beat    Past Surgical History  Procedure Date  . Appendectomy   . Breast lumpectomy   . Melanoma excision     reports that she has never smoked. She does not have any smokeless tobacco history on file. She  reports that she does not drink alcohol or use illicit drugs. family history includes Cancer in her father; Melanoma in her father; and Stroke in her maternal grandfather and mother. Allergies  Allergen Reactions  . Cephalexin   . Penicillins     REACTION: unspecified  . Sulfamethoxazole W/Trimethoprim     REACTION: unspecified        Objective:   Physical Exam  Nursing note and vitals reviewed. Constitutional: She is oriented to person, place, and time. She appears well-developed and well-nourished. No distress.  HENT:  Head: Normocephalic and atraumatic.  Right Ear: External ear normal.  Left Ear: External ear normal.  Nose: Nose normal.  Mouth/Throat: Oropharynx is clear and moist.  Eyes: Conjunctivae and EOM are normal. Pupils are equal, round, and reactive to light.  Neck: Normal range of motion. Neck supple. No JVD present. No tracheal deviation present. No thyromegaly present.  Cardiovascular: Normal rate, regular rhythm, normal heart sounds and intact distal pulses.   No murmur heard. Pulmonary/Chest: Effort normal and breath sounds normal. She has no wheezes. She exhibits no tenderness.  Abdominal: Soft. Bowel sounds are normal.  Musculoskeletal: Normal range of motion. She exhibits no edema and no tenderness.  Lymphadenopathy:    She has no cervical adenopathy.  Neurological: She is alert and oriented to person, place, and time. She has normal reflexes. No cranial nerve deficit.  Skin: Skin is warm and dry. She is not diaphoretic.  Psychiatric: She has a normal mood and affect. Her behavior is normal.          Assessment & Plan:  The taste has responded to low dose digoxin for control of her palpitations we'll continue at the current rate we have given her license to take a whole tablet periodically if her palpitations breakthrough her baseline dose her level reported today was 0.5 with normal being 0.8-1.5.  She has urinary frequency we will obtain a urinalysis  today and treat appropriately if the urine infection is present

## 2011-07-24 ENCOUNTER — Encounter: Payer: Self-pay | Admitting: Internal Medicine

## 2011-09-21 ENCOUNTER — Encounter: Payer: Self-pay | Admitting: Internal Medicine

## 2011-09-21 ENCOUNTER — Ambulatory Visit (INDEPENDENT_AMBULATORY_CARE_PROVIDER_SITE_OTHER): Payer: BC Managed Care – PPO | Admitting: Internal Medicine

## 2011-09-21 VITALS — BP 124/74 | HR 72 | Temp 98.3°F | Resp 16 | Ht 68.0 in | Wt 151.0 lb

## 2011-09-21 DIAGNOSIS — J309 Allergic rhinitis, unspecified: Secondary | ICD-10-CM

## 2011-09-21 DIAGNOSIS — T887XXA Unspecified adverse effect of drug or medicament, initial encounter: Secondary | ICD-10-CM

## 2011-09-21 DIAGNOSIS — I10 Essential (primary) hypertension: Secondary | ICD-10-CM

## 2011-09-21 DIAGNOSIS — E039 Hypothyroidism, unspecified: Secondary | ICD-10-CM

## 2011-09-21 DIAGNOSIS — Z23 Encounter for immunization: Secondary | ICD-10-CM

## 2011-09-21 DIAGNOSIS — G43909 Migraine, unspecified, not intractable, without status migrainosus: Secondary | ICD-10-CM

## 2011-09-21 LAB — CBC WITH DIFFERENTIAL/PLATELET
Basophils Relative: 1 % (ref 0.0–3.0)
Eosinophils Absolute: 0.3 10*3/uL (ref 0.0–0.7)
Eosinophils Relative: 4.3 % (ref 0.0–5.0)
HCT: 40.1 % (ref 36.0–46.0)
Hemoglobin: 13.3 g/dL (ref 12.0–15.0)
Lymphs Abs: 1.7 10*3/uL (ref 0.7–4.0)
MCHC: 33.2 g/dL (ref 30.0–36.0)
MCV: 92.5 fl (ref 78.0–100.0)
Monocytes Absolute: 0.6 10*3/uL (ref 0.1–1.0)
Neutro Abs: 4.5 10*3/uL (ref 1.4–7.7)
Neutrophils Relative %: 63.3 % (ref 43.0–77.0)
RBC: 4.34 Mil/uL (ref 3.87–5.11)

## 2011-10-04 ENCOUNTER — Other Ambulatory Visit: Payer: Self-pay | Admitting: Internal Medicine

## 2011-11-24 ENCOUNTER — Other Ambulatory Visit: Payer: Self-pay | Admitting: Internal Medicine

## 2011-12-21 ENCOUNTER — Ambulatory Visit: Payer: BC Managed Care – PPO | Admitting: Internal Medicine

## 2011-12-22 ENCOUNTER — Encounter: Payer: Self-pay | Admitting: Internal Medicine

## 2011-12-22 ENCOUNTER — Ambulatory Visit (INDEPENDENT_AMBULATORY_CARE_PROVIDER_SITE_OTHER): Payer: BC Managed Care – PPO | Admitting: Internal Medicine

## 2011-12-22 VITALS — BP 130/78 | HR 72 | Temp 98.0°F | Resp 14 | Ht 68.0 in | Wt 149.0 lb

## 2011-12-22 DIAGNOSIS — I341 Nonrheumatic mitral (valve) prolapse: Secondary | ICD-10-CM

## 2011-12-22 DIAGNOSIS — E039 Hypothyroidism, unspecified: Secondary | ICD-10-CM

## 2011-12-22 DIAGNOSIS — R002 Palpitations: Secondary | ICD-10-CM

## 2011-12-22 DIAGNOSIS — I059 Rheumatic mitral valve disease, unspecified: Secondary | ICD-10-CM

## 2011-12-22 NOTE — Progress Notes (Signed)
Subjective:    Patient ID: Tracey Giles, female    DOB: 15-Jun-1951, 61 y.o.   MRN: [id number]  HPI Blood pressure stable Weight stable Palpitations are stable in there frequency and type. No chest pain or tightness. Hx of MVP. Her digoxin levels are on the low end Would consider increasing to one whole tablet.    Review of Systems  Constitutional: Negative for activity change, appetite change and fatigue.  HENT: Negative for ear pain, congestion, neck pain, postnasal drip and sinus pressure.   Eyes: Negative for redness and visual disturbance.  Respiratory: Negative for cough, shortness of breath and wheezing.   Gastrointestinal: Negative for abdominal pain and abdominal distention.  Genitourinary: Negative for dysuria, frequency and menstrual problem.  Musculoskeletal: Negative for myalgias, joint swelling and arthralgias.  Skin: Negative for rash and wound.  Neurological: Negative for dizziness, weakness and headaches.  Hematological: Negative for adenopathy. Does not bruise/bleed easily.  Psychiatric/Behavioral: Negative for sleep disturbance and decreased concentration.   Past Medical History  Diagnosis Date  . Sleep apnea   . MVP (mitral valve prolapse)   . Hypertension   . Thyroid disease   . Irregular heart beat     History   Social History  . Marital Status: Married    Spouse Name: N/A    Number of Children: N/A  . Years of Education: N/A   Occupational History  . [employer]   Social History Main Topics  . Smoking status: Never Smoker   . Smokeless tobacco: Not on file  . Alcohol Use: No  . Drug Use: No  . Sexually Active: Yes   Other Topics Concern  . Not on file   Social History Narrative  . No narrative on file    Past Surgical History  Procedure Date  . Appendectomy   . Breast lumpectomy   . Melanoma excision     Family History  Problem Relation Age of Onset  . Melanoma Father   . Cancer Father     melanoma  . Stroke Mother    . Stroke Maternal Grandfather     Allergies  Allergen Reactions  . Cephalexin   . Penicillins     REACTION: unspecified  . Sulfamethoxazole W/Trimethoprim     REACTION: unspecified    Current Outpatient Prescriptions on File Prior to Visit  Medication Sig Dispense Refill  . Calcium Carbonate-Vitamin D (CALTRATE 600+D) 600-400 MG-UNIT per tablet Take 1 tablet by mouth 2 (two) times daily.        . diazepam (VALIUM) 5 MG tablet TAKE ONE TABLET BY MOUTH EVERY 6 HOURS AS NEEDED FOR  DIZZINESS  OR  HEART  PALPITATIONS.  30 tablet  3  . digoxin (LANOXIN) 0.125 MG tablet TAKE ONE-HALF TABLET BY MOUTH EVERY DAY  30 tablet  6  . fluticasone (FLONASE) 50 MCG/ACT nasal spray 2 sprays by Nasal route daily.        Marland Kitchen levothyroxine (SYNTHROID, LEVOTHROID) 75 MCG tablet Take 1 tablet (75 mcg total) by mouth daily.  30 tablet  11  . metoprolol (TOPROL-XL) 100 MG 24 hr tablet Take 1 tablet (100 mg total) by mouth daily.  30 tablet  11  . NEXIUM 40 MG capsule TAKE ONE CAPSULE BY MOUTH EVERY DAY  30 each  11    BP 130/78  Pulse 72  Temp 98 F (36.7 C)  Resp 14  Ht 5\' 8"  (1.727 m)  Wt 149 lb (67.586 kg)  BMI 22.66 kg/m2  Objective:   Physical Exam  Nursing note and vitals reviewed. Constitutional: She is oriented to person, place, and time. She appears well-developed and well-nourished. No distress.  HENT:  Head: Normocephalic and atraumatic.  Right Ear: External ear normal.  Left Ear: External ear normal.  Nose: Nose normal.  Mouth/Throat: Oropharynx is clear and moist.  Eyes: Conjunctivae and EOM are normal. Pupils are equal, round, and reactive to light.  Neck: Normal range of motion. Neck supple. No JVD present. No tracheal deviation present. No thyromegaly present.  Cardiovascular: Normal rate, regular rhythm and intact distal pulses.  Exam reveals no gallop and no friction rub.   Murmur heard.      Stable 1/6 systolic murmur  Pulmonary/Chest: Effort normal and breath  sounds normal. She has no wheezes. She exhibits no tenderness.  Abdominal: Soft. Bowel sounds are normal.  Musculoskeletal: Normal range of motion. She exhibits no edema and no tenderness.  Lymphadenopathy:    She has no cervical adenopathy.  Neurological: She is alert and oriented to person, place, and time. She has normal reflexes. No cranial nerve deficit.  Skin: Skin is warm and dry. She is not diaphoretic.  Psychiatric: She has a normal mood and affect. Her behavior is normal.          Assessment & Plan:  We will gradually titrate the digoxin level by changing her dosing to a whole tablet alternating with one half tablet she is currently taking one half tablet a day.  We will see if this assists the metoprolol controlling her weight and decreasing her palpitations from mitral valve prolapse  Still has multiple awakenings during the night but is able to resume sleep

## 2011-12-22 NOTE — Patient Instructions (Addendum)
The patient is instructed to continue all medications as prescribed. Schedule followup with check out clerk upon leaving the clinic Do a trial of one whole lanoxin and alternating with one half Lanoxin every other day and monitor its effect on her palpitations

## 2012-02-17 ENCOUNTER — Ambulatory Visit: Payer: BC Managed Care – PPO | Admitting: Internal Medicine

## 2012-03-22 ENCOUNTER — Other Ambulatory Visit (INDEPENDENT_AMBULATORY_CARE_PROVIDER_SITE_OTHER): Payer: BC Managed Care – PPO

## 2012-03-22 DIAGNOSIS — E039 Hypothyroidism, unspecified: Secondary | ICD-10-CM

## 2012-03-22 DIAGNOSIS — T887XXA Unspecified adverse effect of drug or medicament, initial encounter: Secondary | ICD-10-CM

## 2012-03-22 LAB — CBC WITH DIFFERENTIAL/PLATELET
Eosinophils Absolute: 0.3 10*3/uL (ref 0.0–0.7)
HCT: 40 % (ref 36.0–46.0)
Lymphs Abs: 1.5 10*3/uL (ref 0.7–4.0)
MCHC: 33 g/dL (ref 30.0–36.0)
MCV: 91.9 fl (ref 78.0–100.0)
Monocytes Absolute: 0.4 10*3/uL (ref 0.1–1.0)
Neutrophils Relative %: 62.9 % (ref 43.0–77.0)
Platelets: 276 10*3/uL (ref 150.0–400.0)

## 2012-03-22 LAB — TSH: TSH: 1.01 u[IU]/mL (ref 0.35–5.50)

## 2012-03-23 LAB — DIGOXIN LEVEL: Digoxin Level: <0.1 ng/mL — ABNORMAL LOW (ref 0.8–2.0)

## 2012-03-25 ENCOUNTER — Ambulatory Visit: Payer: BC Managed Care – PPO | Admitting: Internal Medicine

## 2012-04-06 ENCOUNTER — Ambulatory Visit (INDEPENDENT_AMBULATORY_CARE_PROVIDER_SITE_OTHER): Payer: BC Managed Care – PPO | Admitting: Internal Medicine

## 2012-04-06 ENCOUNTER — Ambulatory Visit: Payer: BC Managed Care – PPO | Admitting: Internal Medicine

## 2012-04-06 ENCOUNTER — Encounter: Payer: Self-pay | Admitting: Internal Medicine

## 2012-04-06 VITALS — BP 116/70 | HR 68 | Temp 98.2°F | Resp 14 | Ht 68.0 in | Wt 146.0 lb

## 2012-04-06 DIAGNOSIS — I059 Rheumatic mitral valve disease, unspecified: Secondary | ICD-10-CM

## 2012-04-06 DIAGNOSIS — I10 Essential (primary) hypertension: Secondary | ICD-10-CM

## 2012-04-06 DIAGNOSIS — E039 Hypothyroidism, unspecified: Secondary | ICD-10-CM

## 2012-04-06 NOTE — Progress Notes (Signed)
Subjective:    Patient ID: Tracey Giles, female    DOB: [birth year], 61 y.o.   MRN: [id number]  HPI Follow up MVP and palpations with chief complaint of progressive fatigue since started the digoxin   Review of Systems  Constitutional: Negative for activity change, appetite change and fatigue.  HENT: Negative for ear pain, congestion, neck pain, postnasal drip and sinus pressure.   Eyes: Negative for redness and visual disturbance.  Respiratory: Negative for cough, shortness of breath and wheezing.   Gastrointestinal: Negative for abdominal pain and abdominal distention.  Genitourinary: Negative for dysuria, frequency and menstrual problem.  Musculoskeletal: Negative for myalgias, joint swelling and arthralgias.  Skin: Negative for rash and wound.  Neurological: Negative for dizziness, weakness and headaches.  Hematological: Negative for adenopathy. Does not bruise/bleed easily.  Psychiatric/Behavioral: Negative for sleep disturbance and decreased concentration.   Past Medical History  Diagnosis Date  . Sleep apnea   . MVP (mitral valve prolapse)   . Hypertension   . Thyroid disease   . Irregular heart beat     History   Social History  . Marital Status: Married    Spouse Name: N/A    Number of Children: N/A  . Years of Education: N/A   Occupational History  . [employer]   Social History Main Topics  . Smoking status: Never Smoker   . Smokeless tobacco: Not on file  . Alcohol Use: No  . Drug Use: No  . Sexually Active: Yes   Other Topics Concern  . Not on file   Social History Narrative  . No narrative on file    Past Surgical History  Procedure Date  . Appendectomy   . Breast lumpectomy   . Melanoma excision     Family History  Problem Relation Age of Onset  . Melanoma Father   . Cancer Father     melanoma  . Stroke Mother   . Stroke Maternal Grandfather     Allergies  Allergen Reactions  . Cephalexin   . Penicillins     REACTION:  unspecified  . Sulfamethoxazole W-Trimethoprim     REACTION: unspecified    Current Outpatient Prescriptions on File Prior to Visit  Medication Sig Dispense Refill  . Calcium Carbonate-Vitamin D (CALTRATE 600+D) 600-400 MG-UNIT per tablet Take 1 tablet by mouth 2 (two) times daily.        . diazepam (VALIUM) 5 MG tablet TAKE ONE TABLET BY MOUTH EVERY 6 HOURS AS NEEDED FOR  DIZZINESS  OR  HEART  PALPITATIONS.  30 tablet  3  . fluticasone (FLONASE) 50 MCG/ACT nasal spray 2 sprays by Nasal route daily.        Marland Kitchen levothyroxine (SYNTHROID, LEVOTHROID) 75 MCG tablet Take 1 tablet (75 mcg total) by mouth daily.  30 tablet  11  . metoprolol (TOPROL-XL) 100 MG 24 hr tablet Take 1 tablet (100 mg total) by mouth daily.  30 tablet  11  . NEXIUM 40 MG capsule TAKE ONE CAPSULE BY MOUTH EVERY DAY  30 each  11  . DISCONTD: digoxin (LANOXIN) 0.125 MG tablet TAKE ONE-HALF TABLET BY MOUTH EVERY DAY  30 tablet  6    BP 116/70  Pulse 68  Temp 98.2 F (36.8 C)  Resp 14  Ht 5\' 8"  (1.727 m)  Wt 146 lb (66.225 kg)  BMI 22.20 kg/m2        Objective:   Physical Exam  Nursing note and vitals reviewed. Constitutional: She is oriented to  person, place, and time. She appears well-developed and well-nourished. No distress.  HENT:  Head: Normocephalic and atraumatic.  Right Ear: External ear normal.  Left Ear: External ear normal.  Nose: Nose normal.  Mouth/Throat: Oropharynx is clear and moist.  Eyes: Conjunctivae and EOM are normal. Pupils are equal, round, and reactive to light.  Neck: Normal range of motion. Neck supple. No JVD present. No tracheal deviation present. No thyromegaly present.  Cardiovascular: Normal rate, regular rhythm, normal heart sounds and intact distal pulses.   No murmur heard. Pulmonary/Chest: Effort normal and breath sounds normal. She has no wheezes. She exhibits no tenderness.  Abdominal: Soft. Bowel sounds are normal.  Musculoskeletal: Normal range of motion. She exhibits no  edema and no tenderness.  Lymphadenopathy:    She has no cervical adenopathy.  Neurological: She is alert and oriented to person, place, and time. She has normal reflexes. No cranial nerve deficit.  Skin: Skin is warm and dry. She is not diaphoretic.  Psychiatric: She has a normal mood and affect. Her behavior is normal.          Assessment & Plan:  The palpitations have been improved since she's been on the digitoxin but she states that she has experienced fatigue.  I do not believe the fatigue is related to the digoxin but a digoxin level should be obtained.  She also has a history of hypothyroidism and fatigue baby was laid into this diagnoses thyroid labs will be reviewed from the chart.  She's had increased symptoms of her Mnire's disease which I do not believe that again is related to the initiation of digoxin.  Since her palpitations have improved on the digoxin will continue this medication.  She does have a history of mitral valve prolapse. We discussed exercise including walking to build endurance and strength

## 2012-04-06 NOTE — Patient Instructions (Addendum)
The patient is instructed to continue all medications as prescribed. Schedule followup with check out clerk upon leaving the clinic We are going to hold the digoxin and see if the palpitations worsen.  That way you'll be eligible to relate to things #1 is the digoxin helping her palpitations more than you think and #2 is the digoxin causing some of your bladder issues if the bladder issues clear up in the palpitations remain stable and certainly we would not use the digoxin if your episodes of palpitations are intermittent then we will use half of your Toprol on the days that you're having more palpitations.   Your allergies were to try a new nasal spray

## 2012-04-12 ENCOUNTER — Ambulatory Visit: Payer: BC Managed Care – PPO | Admitting: Internal Medicine

## 2012-04-24 ENCOUNTER — Other Ambulatory Visit: Payer: Self-pay | Admitting: Internal Medicine

## 2012-05-16 ENCOUNTER — Ambulatory Visit: Payer: BC Managed Care – PPO | Admitting: Internal Medicine

## 2012-06-03 ENCOUNTER — Encounter: Payer: Self-pay | Admitting: Internal Medicine

## 2012-06-03 ENCOUNTER — Ambulatory Visit (INDEPENDENT_AMBULATORY_CARE_PROVIDER_SITE_OTHER): Payer: BC Managed Care – PPO | Admitting: Internal Medicine

## 2012-06-03 VITALS — BP 124/74 | HR 76 | Temp 98.2°F | Resp 16 | Ht 68.0 in | Wt 148.0 lb

## 2012-06-03 DIAGNOSIS — Z2911 Encounter for prophylactic immunotherapy for respiratory syncytial virus (RSV): Secondary | ICD-10-CM

## 2012-06-03 DIAGNOSIS — G44209 Tension-type headache, unspecified, not intractable: Secondary | ICD-10-CM

## 2012-06-03 DIAGNOSIS — R002 Palpitations: Secondary | ICD-10-CM

## 2012-06-03 DIAGNOSIS — M549 Dorsalgia, unspecified: Secondary | ICD-10-CM

## 2012-06-03 DIAGNOSIS — Z0000 Encounter for general adult medical examination without abnormal findings: Secondary | ICD-10-CM

## 2012-06-03 NOTE — Patient Instructions (Signed)
Back Exercises Back exercises help treat and prevent back injuries. The goal of back exercises is to increase the strength of your abdominal and back muscles and the flexibility of your back. These exercises should be started when you no longer have back pain. Back exercises include:  Pelvic Tilt. Lie on your back with your knees bent. Tilt your pelvis until the lower part of your back is against the floor. Hold this position 5 to 10 sec and repeat 5 to 10 times.   Knee to Chest. Pull first 1 knee up against your chest and hold for 20 to 30 seconds, repeat this with the other knee, and then both knees. This may be done with the other leg straight or bent, whichever feels better.   Sit-Ups or Curl-Ups. Bend your knees 90 degrees. Start with tilting your pelvis, and do a partial, slow sit-up, lifting your trunk only 30 to 45 degrees off the floor. Take at least 2 to 3 seconds for each sit-up. Do not do sit-ups with your knees out straight. If partial sit-ups are difficult, simply do the above but with only tightening your abdominal muscles and holding it as directed.   Hip-Lift. Lie on your back with your knees flexed 90 degrees. Push down with your feet and shoulders as you raise your hips a couple inches off the floor; hold for 10 seconds, repeat 5 to 10 times.   Back arches. Lie on your stomach, propping yourself up on bent elbows. Slowly press on your hands, causing an arch in your low back. Repeat 3 to 5 times. Any initial stiffness and discomfort should lessen with repetition over time.   Shoulder-Lifts. Lie face down with arms beside your body. Keep hips and torso pressed to floor as you slowly lift your head and shoulders off the floor.  Do not overdo your exercises, especially in the beginning. Exercises may cause you some mild back discomfort which lasts for a few minutes; however, if the pain is more severe, or lasts for more than 15 minutes, do not continue exercises until you see your  caregiver. Improvement with exercise therapy for back problems is slow.  See your caregivers for assistance with developing a proper back exercise program. Document Released: 12/31/2004 Document Revised: 11/12/2011 Document Reviewed: 11/23/2005 [employer] Patient Information 2012 [employer]. 

## 2012-06-03 NOTE — Progress Notes (Signed)
Subjective:    Patient ID: Tracey Giles, female    DOB: 11/07/1951, 61 y.o.   MRN: [id number]  HPI persistent mild palpitations Mild back pain Blood pressure stable on current medications thyroid monitoring up to date history of hyperkalemia monitor a basic metabolic panel as indicated history of Mnire's disease and a history of migraine headaches   Review of Systems  Constitutional: Negative for activity change, appetite change and fatigue.  HENT: Negative for ear pain, congestion, neck pain, postnasal drip and sinus pressure.   Eyes: Negative for redness and visual disturbance.  Respiratory: Negative for cough, shortness of breath and wheezing.   Gastrointestinal: Negative for abdominal pain and abdominal distention.  Genitourinary: Negative for dysuria, frequency and menstrual problem.  Musculoskeletal: Negative for myalgias, joint swelling and arthralgias.  Skin: Negative for rash and wound.  Neurological: Negative for dizziness, weakness and headaches.  Hematological: Negative for adenopathy. Does not bruise/bleed easily.  Psychiatric/Behavioral: Negative for disturbed wake/sleep cycle and decreased concentration.   Past Medical History  Diagnosis Date  . Sleep apnea   . MVP (mitral valve prolapse)   . Hypertension   . Thyroid disease   . Irregular heart beat     History   Social History  . Marital Status: Married    Spouse Name: N/A    Number of Children: N/A  . Years of Education: N/A   Occupational History  . [employer]   Social History Main Topics  . Smoking status: Never Smoker   . Smokeless tobacco: Not on file  . Alcohol Use: No  . Drug Use: No  . Sexually Active: Yes   Other Topics Concern  . Not on file   Social History Narrative  . No narrative on file    Past Surgical History  Procedure Date  . Appendectomy   . Breast lumpectomy   . Melanoma excision     Family History  Problem Relation Age of Onset  . Melanoma Father   .  Cancer Father     melanoma  . Stroke Mother   . Stroke Maternal Grandfather     Allergies  Allergen Reactions  . Cephalexin   . Penicillins     REACTION: unspecified  . Sulfamethoxazole W-Trimethoprim     REACTION: unspecified    Current Outpatient Prescriptions on File Prior to Visit  Medication Sig Dispense Refill  . Calcium Carbonate-Vitamin D (CALTRATE 600+D) 600-400 MG-UNIT per tablet Take 1 tablet by mouth 2 (two) times daily.        . diazepam (VALIUM) 5 MG tablet TAKE ONE TABLET BY MOUTH EVERY 6 HOURS AS NEEDED FOR  DIZZINESS  OR  HEART  PALPITATIONS.  30 tablet  3  . digoxin (LANOXIN) 0.125 MG tablet       . fluticasone (FLONASE) 50 MCG/ACT nasal spray 2 sprays by Nasal route daily.        Marland Kitchen levothyroxine (SYNTHROID, LEVOTHROID) 75 MCG tablet Take 1 tablet (75 mcg total) by mouth daily.  30 tablet  11  . metoprolol succinate (TOPROL-XL) 100 MG 24 hr tablet TAKE ONE TABLET BY MOUTH EVERY DAY  30 tablet  3  . NEXIUM 40 MG capsule TAKE ONE CAPSULE BY MOUTH EVERY DAY  30 each  11  . DISCONTD: levothyroxine (SYNTHROID, LEVOTHROID) 75 MCG tablet TAKE ONE TABLET BY MOUTH EVERY DAY  30 tablet  3  . DISCONTD: metoprolol (TOPROL-XL) 100 MG 24 hr tablet Take 1 tablet (100 mg total) by mouth daily.  30  tablet  11    BP 124/74  Pulse 76  Temp 98.2 F (36.8 C)  Resp 16  Ht 5\' 8"  (1.727 m)  Wt 148 lb (67.132 kg)  BMI 22.50 kg/m2       Objective:   Physical Exam  Constitutional: She is oriented to person, place, and time. She appears well-developed and well-nourished. No distress.  HENT:  Head: Normocephalic and atraumatic.  Right Ear: External ear normal.  Left Ear: External ear normal.  Nose: Nose normal.  Mouth/Throat: Oropharynx is clear and moist.  Eyes: Conjunctivae and EOM are normal. Pupils are equal, round, and reactive to light.  Neck: Normal range of motion. Neck supple. No JVD present. No tracheal deviation present. No thyromegaly present.  Cardiovascular:  Normal rate, regular rhythm, normal heart sounds and intact distal pulses.   No murmur heard. Pulmonary/Chest: Effort normal and breath sounds normal. She has no wheezes. She exhibits no tenderness.  Abdominal: Soft. Bowel sounds are normal.  Musculoskeletal: Normal range of motion. She exhibits no edema and no tenderness.  Lymphadenopathy:    She has no cervical adenopathy.  Neurological: She is alert and oriented to person, place, and time. She has normal reflexes. No cranial nerve deficit.  Skin: Skin is warm and dry. She is not diaphoretic.  Psychiatric: She has a normal mood and affect. Her behavior is normal.          Assessment & Plan:  palpitations have increased recently she did notice a decrease in the palpitations when she began the Lanoxin for a digoxin level will be ordered today.  We will also check her T3-T4 and TSH to make sure she is not over replaced on her hypothyroidism.  She continues to take Toprol 100 mg by mouth daily with stable blood pressure.  She does have GERD and she is on Nexium her Mnire's disease is stable

## 2012-06-16 ENCOUNTER — Other Ambulatory Visit: Payer: Self-pay | Admitting: Internal Medicine

## 2012-08-01 ENCOUNTER — Encounter: Payer: Self-pay | Admitting: Internal Medicine

## 2012-08-16 ENCOUNTER — Other Ambulatory Visit: Payer: Self-pay | Admitting: Internal Medicine

## 2012-08-30 ENCOUNTER — Other Ambulatory Visit: Payer: Self-pay | Admitting: *Deleted

## 2012-08-30 ENCOUNTER — Other Ambulatory Visit: Payer: Self-pay | Admitting: Internal Medicine

## 2012-08-30 MED ORDER — LEVOTHYROXINE SODIUM 75 MCG PO TABS: 75.0000 ug | ORAL_TABLET | Freq: Every day | ORAL | Status: DC

## 2012-09-05 ENCOUNTER — Encounter: Payer: Self-pay | Admitting: Internal Medicine

## 2012-09-05 ENCOUNTER — Ambulatory Visit (INDEPENDENT_AMBULATORY_CARE_PROVIDER_SITE_OTHER): Payer: BC Managed Care – PPO | Admitting: Internal Medicine

## 2012-09-05 VITALS — BP 124/78 | HR 72 | Temp 98.3°F | Resp 16 | Ht 68.0 in | Wt 150.0 lb

## 2012-09-05 DIAGNOSIS — I10 Essential (primary) hypertension: Secondary | ICD-10-CM

## 2012-09-05 DIAGNOSIS — R002 Palpitations: Secondary | ICD-10-CM

## 2012-09-05 DIAGNOSIS — R350 Frequency of micturition: Secondary | ICD-10-CM

## 2012-09-05 DIAGNOSIS — Z23 Encounter for immunization: Secondary | ICD-10-CM

## 2012-09-05 LAB — POCT URINALYSIS DIPSTICK
Blood, UA: NEGATIVE
Protein, UA: NEGATIVE
Spec Grav, UA: 1.01
Urobilinogen, UA: 0.2
pH, UA: 6

## 2012-09-05 MED ORDER — CIPROFLOXACIN HCL 500 MG PO TABS: 500.0000 mg | ORAL_TABLET | Freq: Two times a day (BID) | ORAL | Status: DC

## 2012-09-05 NOTE — Patient Instructions (Signed)
The patient is instructed to continue all medications as prescribed. Schedule followup with check out clerk upon leaving the clinic  

## 2012-09-05 NOTE — Progress Notes (Signed)
  Subjective:    Patient ID: Tracey Giles, female    DOB: 1951/09/25, 61 y.o.   MRN: [id number]  HPI Patient is a 61 year old female who is followed for hypertension and a history of palpitations with hypothyroidism.  She states that she occasionally has episodes of irregular heartbeat but no syncope her presentation is complicated by the fact that she has hypothyroidism and Mnire's disease and a history of mitral valve prolapse.  Right now she is on low-dose digoxin stable dose of Synthroid and Toprol-XL 100.  We discussed the fact that she can take an extra half tablet of she has a day which she has a lot of palpitations   Review of Systems  Constitutional: Positive for fatigue. Negative for activity change and appetite change.  HENT: Negative for ear pain, congestion, neck pain, postnasal drip and sinus pressure.   Eyes: Negative for redness and visual disturbance.  Respiratory: Negative for cough, shortness of breath and wheezing.   Gastrointestinal: Positive for nausea. Negative for abdominal pain and abdominal distention.  Genitourinary: Negative for dysuria, frequency and menstrual problem.  Musculoskeletal: Negative for myalgias, joint swelling and arthralgias.  Skin: Negative for rash and wound.  Neurological: Positive for dizziness, weakness and light-headedness. Negative for headaches.  Hematological: Negative for adenopathy. Does not bruise/bleed easily.  Psychiatric/Behavioral: Negative for disturbed wake/sleep cycle and decreased concentration.       Objective:   Physical Exam  Nursing note and vitals reviewed. Constitutional: She is oriented to person, place, and time. She appears well-developed and well-nourished. No distress.  HENT:  Head: Normocephalic and atraumatic.  Right Ear: External ear normal.  Left Ear: External ear normal.  Nose: Nose normal.  Mouth/Throat: Oropharynx is clear and moist.  Eyes: Conjunctivae normal and EOM are normal. Pupils  are equal, round, and reactive to light.  Neck: Normal range of motion. Neck supple. No JVD present. No tracheal deviation present. No thyromegaly present.  Cardiovascular: Normal rate, regular rhythm, normal heart sounds and intact distal pulses.   No murmur heard. Pulmonary/Chest: Effort normal and breath sounds normal. She has no wheezes. She exhibits no tenderness.  Abdominal: Soft. Bowel sounds are normal.  Musculoskeletal: Normal range of motion. She exhibits no edema and no tenderness.  Lymphadenopathy:    She has no cervical adenopathy.  Neurological: She is alert and oriented to person, place, and time. She has normal reflexes. No cranial nerve deficit.  Skin: Skin is warm and dry. She is not diaphoretic.  Psychiatric: She has a normal mood and affect. Her behavior is normal.    Stable blood pressure       Assessment & Plan:  She is off the digoxin.  Her thyroid levels are stable her current medications and I believe that strategies take an extra half Toprol when necessary 4 days which she has increased number of palpitations is inadequate strategy.  Stable Mnire's  Stable GERD  Increased allergies symptomology with complication of Mnire's due to the sinus pressure

## 2012-09-05 NOTE — Addendum Note (Signed)
Addended by: Willy Eddy on: 09/05/2012 05:54 PM   Modules accepted: Orders

## 2012-09-08 NOTE — Progress Notes (Signed)
  Subjective:    Patient ID: Tracey Giles, female    DOB: [birth year], 61 y.o.   MRN: [id number]  HPI    Review of Systems     Objective:   Physical Exam        Assessment & Plan:  Monitoring of digoxin level after starting digoxin for palpitations No charge office visit labs drawn

## 2012-09-23 ENCOUNTER — Telehealth: Payer: Self-pay | Admitting: Internal Medicine

## 2012-09-23 NOTE — Telephone Encounter (Signed)
I am still dealing w/insurance on this. Can't seem to get an answer from them. Will try again today to get an answer.

## 2012-09-23 NOTE — Telephone Encounter (Signed)
Caller: Tracey Giles/Patient; Patient Name: Tracey Giles; PCP: Darryll Capers (Adults only); Best Callback Phone Number: [phone] Onset- 09/22/12 Pt has been without her Nexium since last Friday 09/16/12. The pharmacy has faxed office, apparently insurance is not wanting to pay for rx and wants recommendation of something else she can take. She c/o of reflux s/s coming back since she has been without medication, heart burn, regurgitation. She is trying OTC Pepcid but that is not working. Emergent s/s of Heartburn protocol r/o. Pt to see provider within 72hrs. Pt would like to know what physician may want her to switch to if insurance will not cover Nexium, so she can get started on medication. Pharmacy is [facility].

## 2012-09-26 ENCOUNTER — Other Ambulatory Visit: Payer: Self-pay | Admitting: *Deleted

## 2012-09-26 MED ORDER — OMEPRAZOLE 40 MG PO CPDR: 40.0000 mg | DELAYED_RELEASE_CAPSULE | Freq: Every day | ORAL | Status: DC

## 2012-09-26 NOTE — Telephone Encounter (Signed)
Per dr Lovell Sheehan may try omeprazole--sent to phrmacy and pt informed

## 2012-10-10 ENCOUNTER — Other Ambulatory Visit: Payer: Self-pay | Admitting: Internal Medicine

## 2012-10-25 ENCOUNTER — Ambulatory Visit: Payer: BC Managed Care – PPO | Attending: Otolaryngology | Admitting: *Deleted

## 2012-10-25 DIAGNOSIS — R269 Unspecified abnormalities of gait and mobility: Secondary | ICD-10-CM | POA: Insufficient documentation

## 2012-10-25 DIAGNOSIS — H8109 Meniere's disease, unspecified ear: Secondary | ICD-10-CM | POA: Insufficient documentation

## 2012-10-25 DIAGNOSIS — IMO0001 Reserved for inherently not codable concepts without codable children: Secondary | ICD-10-CM | POA: Insufficient documentation

## 2012-10-27 ENCOUNTER — Other Ambulatory Visit: Payer: Self-pay | Admitting: *Deleted

## 2012-10-27 MED ORDER — DIAZEPAM 5 MG PO TABS: 5.0000 mg | ORAL_TABLET | Freq: Four times a day (QID) | ORAL | Status: DC | PRN

## 2012-10-31 ENCOUNTER — Ambulatory Visit: Payer: BC Managed Care – PPO | Admitting: *Deleted

## 2012-11-01 ENCOUNTER — Ambulatory Visit: Payer: BC Managed Care – PPO | Admitting: *Deleted

## 2012-11-07 ENCOUNTER — Ambulatory Visit: Payer: BC Managed Care – PPO | Attending: Otolaryngology | Admitting: *Deleted

## 2012-11-07 DIAGNOSIS — IMO0001 Reserved for inherently not codable concepts without codable children: Secondary | ICD-10-CM | POA: Insufficient documentation

## 2012-11-07 DIAGNOSIS — R269 Unspecified abnormalities of gait and mobility: Secondary | ICD-10-CM | POA: Insufficient documentation

## 2012-11-07 DIAGNOSIS — H8109 Meniere's disease, unspecified ear: Secondary | ICD-10-CM | POA: Insufficient documentation

## 2012-11-09 ENCOUNTER — Ambulatory Visit: Payer: BC Managed Care – PPO | Admitting: *Deleted

## 2012-11-15 ENCOUNTER — Ambulatory Visit: Payer: BC Managed Care – PPO | Admitting: Physical Therapy

## 2012-11-17 ENCOUNTER — Ambulatory Visit: Payer: BC Managed Care – PPO | Admitting: *Deleted

## 2012-11-21 ENCOUNTER — Ambulatory Visit: Payer: BC Managed Care – PPO | Admitting: *Deleted

## 2012-11-23 ENCOUNTER — Ambulatory Visit: Payer: BC Managed Care – PPO | Admitting: *Deleted

## 2013-01-06 ENCOUNTER — Ambulatory Visit (INDEPENDENT_AMBULATORY_CARE_PROVIDER_SITE_OTHER): Payer: BC Managed Care – PPO | Admitting: Internal Medicine

## 2013-01-06 ENCOUNTER — Encounter: Payer: Self-pay | Admitting: Internal Medicine

## 2013-01-06 VITALS — BP 140/90 | HR 72 | Temp 97.9°F | Wt 152.0 lb

## 2013-01-06 DIAGNOSIS — J019 Acute sinusitis, unspecified: Secondary | ICD-10-CM

## 2013-01-06 DIAGNOSIS — I341 Nonrheumatic mitral (valve) prolapse: Secondary | ICD-10-CM

## 2013-01-06 DIAGNOSIS — R51 Headache: Secondary | ICD-10-CM

## 2013-01-06 DIAGNOSIS — R519 Headache, unspecified: Secondary | ICD-10-CM

## 2013-01-06 DIAGNOSIS — I059 Rheumatic mitral valve disease, unspecified: Secondary | ICD-10-CM

## 2013-01-06 MED ORDER — DOXYCYCLINE HYCLATE 100 MG PO TABS: 100.0000 mg | ORAL_TABLET | Freq: Two times a day (BID) | ORAL | Status: DC

## 2013-01-06 NOTE — Patient Instructions (Signed)
The patient is instructed to continue all medications as prescribed. Schedule followup with check out clerk upon leaving the clinic  

## 2013-01-06 NOTE — Progress Notes (Signed)
  Subjective:    Patient ID: Tracey Giles, female    DOB: [birth year], 62 y.o.   MRN: [id number]  HPI  Head aches increased at he basse of the skull Tender over right eye Discharge and sore in nose MVP symptoms better  Review of Systems  Constitutional: Negative for activity change, appetite change and fatigue.  HENT: Negative for ear pain, congestion, neck pain, postnasal drip and sinus pressure.   Eyes: Negative for redness and visual disturbance.  Respiratory: Negative for cough, shortness of breath and wheezing.   Gastrointestinal: Negative for abdominal pain and abdominal distention.  Genitourinary: Negative for dysuria, frequency and menstrual problem.  Musculoskeletal: Negative for myalgias, joint swelling and arthralgias.  Skin: Negative for rash and wound.  Neurological: Positive for weakness, light-headedness and headaches. Negative for dizziness.  Hematological: Negative for adenopathy. Does not bruise/bleed easily.  Psychiatric/Behavioral: Negative for sleep disturbance and decreased concentration.       Objective:   Physical Exam  Nursing note and vitals reviewed. Constitutional: She is oriented to person, place, and time. She appears well-developed and well-nourished. No distress.  HENT:  Head: Normocephalic and atraumatic.  Right Ear: External ear normal.  Left Ear: External ear normal.  Nose: Nose normal.  Mouth/Throat: Oropharynx is clear and moist.  Eyes: Conjunctivae normal and EOM are normal. Pupils are equal, round, and reactive to light.  Neck: Normal range of motion. Neck supple. No JVD present. No tracheal deviation present. No thyromegaly present.  Cardiovascular: Normal rate, regular rhythm, normal heart sounds and intact distal pulses.   No murmur heard. Pulmonary/Chest: Effort normal and breath sounds normal. She has no wheezes. She exhibits no tenderness.  Abdominal: Soft. Bowel sounds are normal.  Musculoskeletal: Normal range of motion.  She exhibits no edema and no tenderness.  Lymphadenopathy:    She has no cervical adenopathy.  Neurological: She is alert and oriented to person, place, and time. She has normal reflexes. No cranial nerve deficit.  Skin: Skin is warm and dry. She is not diaphoretic.  Psychiatric: She has a normal mood and affect. Her behavior is normal.          Assessment & Plan:  Chronic sinusitis Trial doxycycline

## 2013-02-17 ENCOUNTER — Other Ambulatory Visit: Payer: Self-pay | Admitting: Internal Medicine

## 2013-03-20 ENCOUNTER — Other Ambulatory Visit: Payer: Self-pay | Admitting: Internal Medicine

## 2013-04-18 ENCOUNTER — Other Ambulatory Visit: Payer: Self-pay | Admitting: Internal Medicine

## 2013-05-08 ENCOUNTER — Ambulatory Visit (INDEPENDENT_AMBULATORY_CARE_PROVIDER_SITE_OTHER): Payer: BC Managed Care – PPO | Admitting: Internal Medicine

## 2013-05-08 ENCOUNTER — Encounter: Payer: Self-pay | Admitting: Internal Medicine

## 2013-05-08 VITALS — BP 130/80 | HR 76 | Temp 98.2°F | Resp 16 | Ht 68.0 in | Wt 150.0 lb

## 2013-05-08 DIAGNOSIS — N318 Other neuromuscular dysfunction of bladder: Secondary | ICD-10-CM

## 2013-05-08 DIAGNOSIS — K219 Gastro-esophageal reflux disease without esophagitis: Secondary | ICD-10-CM

## 2013-05-08 DIAGNOSIS — E039 Hypothyroidism, unspecified: Secondary | ICD-10-CM

## 2013-05-08 DIAGNOSIS — N3281 Overactive bladder: Secondary | ICD-10-CM

## 2013-05-08 DIAGNOSIS — I10 Essential (primary) hypertension: Secondary | ICD-10-CM

## 2013-05-08 DIAGNOSIS — R351 Nocturia: Secondary | ICD-10-CM

## 2013-05-08 LAB — POCT URINALYSIS DIPSTICK
Bilirubin, UA: NEGATIVE
Leukocytes, UA: NEGATIVE
Nitrite, UA: NEGATIVE
Urobilinogen, UA: 1
pH, UA: 5.5

## 2013-05-08 MED ORDER — FESOTERODINE FUMARATE ER 4 MG PO TB24: 4.0000 mg | ORAL_TABLET | Freq: Every day | ORAL | Status: DC

## 2013-05-08 NOTE — Progress Notes (Signed)
Subjective:    Patient ID: Tracey Giles, female    DOB: 03-05-1951, 62 y.o.   MRN: [id number]  HPI Still noting nocturia 4-6 times a night History of overactive bladder No burning or foul odor No evidence of dysuria  Has noted mild Gastritis With pressure.    Review of Systems  Constitutional: Negative for activity change, appetite change and fatigue.  HENT: Negative for ear pain, congestion, neck pain, postnasal drip and sinus pressure.   Eyes: Negative for redness and visual disturbance.  Respiratory: Negative for cough, shortness of breath and wheezing.   Gastrointestinal: Negative for abdominal pain and abdominal distention.  Genitourinary: Negative for dysuria, frequency and menstrual problem.  Musculoskeletal: Negative for myalgias, joint swelling and arthralgias.  Skin: Negative for rash and wound.  Neurological: Negative for dizziness, weakness and headaches.  Hematological: Negative for adenopathy. Does not bruise/bleed easily.  Psychiatric/Behavioral: Negative for sleep disturbance and decreased concentration.    The patient is instructed to continue all medications as prescribed. Schedule followup with check out clerk upon leaving the clinic Past Medical History  Diagnosis Date  . Sleep apnea   . MVP (mitral valve prolapse)   . Hypertension   . Thyroid disease   . Irregular heart beat     History   Social History  . Marital Status: Married    Spouse Name: N/A    Number of Children: N/A  . Years of Education: N/A   Occupational History  . [employer]   Social History Main Topics  . Smoking status: Never Smoker   . Smokeless tobacco: Not on file  . Alcohol Use: No  . Drug Use: No  . Sexually Active: Yes   Other Topics Concern  . Not on file   Social History Narrative  . No narrative on file    Past Surgical History  Procedure Laterality Date  . Appendectomy    . Breast lumpectomy    . Melanoma excision      Family History  Problem  Relation Age of Onset  . Melanoma Father   . Cancer Father     melanoma  . Stroke Mother   . Stroke Maternal Grandfather     Allergies  Allergen Reactions  . Cephalexin   . Penicillins     REACTION: unspecified  . Sulfamethoxazole W-Trimethoprim     REACTION: unspecified    Current Outpatient Prescriptions on File Prior to Visit  Medication Sig Dispense Refill  . Calcium Carbonate-Vitamin D (CALTRATE 600+D) 600-400 MG-UNIT per tablet Take 1 tablet by mouth 2 (two) times daily.        . diazepam (VALIUM) 5 MG tablet Take 1 tablet (5 mg total) by mouth every 6 (six) hours as needed for anxiety.  30 tablet  3  . doxycycline (VIBRA-TABS) 100 MG tablet Take 1 tablet (100 mg total) by mouth 2 (two) times daily.  20 tablet  0  . fluticasone (FLONASE) 50 MCG/ACT nasal spray 2 sprays by Nasal route daily.        Marland Kitchen levothyroxine (SYNTHROID, LEVOTHROID) 75 MCG tablet Take 1 tablet (75 mcg total) by mouth daily.  30 tablet  11  . omeprazole (PRILOSEC) 40 MG capsule Take 1 capsule (40 mg total) by mouth daily.  30 capsule  6  . metoprolol succinate (TOPROL-XL) 100 MG 24 hr tablet TAKE ONE TABLET BY MOUTH ONCE DAILY  30 tablet  0   No current facility-administered medications on file prior to visit.    BP  130/80  Pulse 76  Temp(Src) 98.2 F (36.8 C)  Resp 16  Ht 5\' 8"  (1.727 m)  Wt 150 lb (68.04 kg)  BMI 22.81 kg/m2       Objective:   Physical Exam  Nursing note and vitals reviewed. Constitutional: She is oriented to person, place, and time. She appears well-developed and well-nourished. No distress.  HENT:  Head: Normocephalic and atraumatic.  Right Ear: External ear normal.  Left Ear: External ear normal.  Nose: Nose normal.  Mouth/Throat: Oropharynx is clear and moist.  Eyes: Conjunctivae and EOM are normal. Pupils are equal, round, and reactive to light.  Neck: Normal range of motion. Neck supple. No JVD present. No tracheal deviation present. No thyromegaly present.   Cardiovascular: Normal rate, regular rhythm, normal heart sounds and intact distal pulses.   No murmur heard. Pulmonary/Chest: Effort normal and breath sounds normal. She has no wheezes. She exhibits no tenderness.  Abdominal: Soft. Bowel sounds are normal.  Musculoskeletal: Normal range of motion. She exhibits no edema and no tenderness.  Lymphadenopathy:    She has no cervical adenopathy.  Neurological: She is alert and oriented to person, place, and time. She has normal reflexes. No cranial nerve deficit.  Skin: Skin is warm and dry. She is not diaphoretic.  Psychiatric: She has a normal mood and affect. Her behavior is normal.          Assessment & Plan:  Overactive bladder New diagnosis Trial of tovias and check [place] and culture to monitor for infection Stable HTN  Monitoring TSH Mild mid back spasm Ice then heat

## 2013-05-08 NOTE — Patient Instructions (Signed)
The patient is instructed to continue all medications as prescribed. Schedule followup with check out clerk upon leaving the clinic  

## 2013-05-09 LAB — CBC WITH DIFFERENTIAL/PLATELET
Basophils Absolute: 0 10*3/uL (ref 0.0–0.1)
Eosinophils Absolute: 0.2 10*3/uL (ref 0.0–0.7)
HCT: 40.2 % (ref 36.0–46.0)
Lymphocytes Relative: 24.2 % (ref 12.0–46.0)
Lymphs Abs: 1.9 10*3/uL (ref 0.7–4.0)
Monocytes Relative: 8.3 % (ref 3.0–12.0)
Platelets: 260 10*3/uL (ref 150.0–400.0)
RDW: 12.9 % (ref 11.5–14.6)

## 2013-05-09 LAB — COMPREHENSIVE METABOLIC PANEL
Albumin: 3.6 g/dL (ref 3.5–5.2)
CO2: 26 mEq/L (ref 19–32)
GFR: 80.81 mL/min (ref >60.00)
Glucose, Bld: 93 mg/dL (ref 70–99)
Potassium: 4.3 mEq/L (ref 3.5–5.1)
Sodium: 138 mEq/L (ref 135–145)
Total Protein: 7.3 g/dL (ref 6.0–8.3)

## 2013-05-09 LAB — TSH: TSH: 0.65 u[IU]/mL (ref 0.35–5.50)

## 2013-05-19 ENCOUNTER — Other Ambulatory Visit: Payer: Self-pay | Admitting: *Deleted

## 2013-05-19 MED ORDER — METOPROLOL SUCCINATE ER 100 MG PO TB24: ORAL_TABLET | ORAL | Status: DC

## 2013-08-09 ENCOUNTER — Encounter: Payer: Self-pay | Admitting: Internal Medicine

## 2013-08-14 ENCOUNTER — Other Ambulatory Visit: Payer: Self-pay | Admitting: Internal Medicine

## 2013-09-25 ENCOUNTER — Other Ambulatory Visit: Payer: Self-pay | Admitting: Internal Medicine

## 2013-10-12 ENCOUNTER — Other Ambulatory Visit: Payer: Self-pay

## 2013-10-25 ENCOUNTER — Other Ambulatory Visit: Payer: Self-pay | Admitting: Internal Medicine

## 2013-11-07 ENCOUNTER — Encounter: Payer: Self-pay | Admitting: *Deleted

## 2013-11-08 ENCOUNTER — Ambulatory Visit (INDEPENDENT_AMBULATORY_CARE_PROVIDER_SITE_OTHER): Payer: BC Managed Care – PPO | Admitting: Internal Medicine

## 2013-11-08 ENCOUNTER — Encounter: Payer: Self-pay | Admitting: Internal Medicine

## 2013-11-08 VITALS — BP 130/80 | HR 72 | Temp 98.2°F | Resp 16 | Ht 68.0 in | Wt 152.0 lb

## 2013-11-08 DIAGNOSIS — G43909 Migraine, unspecified, not intractable, without status migrainosus: Secondary | ICD-10-CM

## 2013-11-08 DIAGNOSIS — I10 Essential (primary) hypertension: Secondary | ICD-10-CM

## 2013-11-08 DIAGNOSIS — I059 Rheumatic mitral valve disease, unspecified: Secondary | ICD-10-CM

## 2013-11-08 NOTE — Progress Notes (Signed)
Subjective:    Patient ID: Tracey Giles, female    DOB: 08/05/1951, 62 y.o.   MRN: [id number]  Hypertension Associated symptoms include palpitations. Pertinent negatives include no headaches, neck pain or shortness of breath.  Gastrophageal Reflux She reports no abdominal pain, no coughing or no wheezing. Associated symptoms include fatigue.   Increased HA due to sinus pressure palpitations due to MVP Notes increasing dowagers hump Mid low back pain due to posture   Review of Systems  Constitutional: Positive for fatigue. Negative for activity change and appetite change.  HENT: Negative for congestion, ear pain, postnasal drip and sinus pressure.   Eyes: Negative for redness and visual disturbance.  Respiratory: Negative for cough, shortness of breath and wheezing.   Cardiovascular: Positive for palpitations.  Gastrointestinal: Negative for abdominal pain and abdominal distention.  Genitourinary: Negative for dysuria, frequency and menstrual problem.  Musculoskeletal: Negative for arthralgias, joint swelling, myalgias and neck pain.  Skin: Negative for rash and wound.  Neurological: Positive for light-headedness. Negative for dizziness, weakness and headaches.  Hematological: Negative for adenopathy. Does not bruise/bleed easily.  Psychiatric/Behavioral: Negative for sleep disturbance and decreased concentration.   Past Medical History  Diagnosis Date  . Sleep apnea   . MVP (mitral valve prolapse)   . Hypertension   . Thyroid disease   . Irregular heart beat     History   Social History  . Marital Status: Married    Spouse Name: N/A    Number of Children: N/A  . Years of Education: N/A   Occupational History  . [employer]   Social History Main Topics  . Smoking status: Never Smoker   . Smokeless tobacco: Not on file  . Alcohol Use: No  . Drug Use: No  . Sexual Activity: Yes   Other Topics Concern  . Not on file   Social History Narrative  . No  narrative on file    Past Surgical History  Procedure Laterality Date  . Appendectomy    . Breast lumpectomy    . Melanoma excision      Family History  Problem Relation Age of Onset  . Melanoma Father   . Cancer Father     melanoma  . Stroke Mother   . Stroke Maternal Grandfather     Allergies  Allergen Reactions  . Cephalexin   . Penicillins     REACTION: unspecified  . Sulfamethoxazole-Trimethoprim     REACTION: unspecified    Current Outpatient Prescriptions on File Prior to Visit  Medication Sig Dispense Refill  . Calcium Carbonate-Vitamin D (CALTRATE 600+D) 600-400 MG-UNIT per tablet Take 1 tablet by mouth 2 (two) times daily.        . clarithromycin (BIAXIN) 500 MG tablet TAKE TWO TABLETS BY MOUTH ONE HOUR BEFORE DENTAL APPOINTMENT  6 tablet  0  . diazepam (VALIUM) 5 MG tablet TAKE ONE TABLET BY MOUTH EVERY 6 HOURS AS NEEDED FOR DIZZINESS OR HEART PALPITATIONS  30 tablet  3  . fesoterodine (TOVIAZ) 4 MG TB24 Take 1 tablet (4 mg total) by mouth daily.  30 tablet  11  . fluticasone (FLONASE) 50 MCG/ACT nasal spray 2 sprays by Nasal route daily.        Marland Kitchen levothyroxine (SYNTHROID, LEVOTHROID) 75 MCG tablet TAKE ONE TABLET BY MOUTH EVERY DAY  90 tablet  3  . metoprolol succinate (TOPROL-XL) 100 MG 24 hr tablet TAKE ONE TABLET BY MOUTH ONCE DAILY  90 tablet  3  . omeprazole (PRILOSEC)  40 MG capsule Take 1 capsule (40 mg total) by mouth daily.  30 capsule  6   No current facility-administered medications on file prior to visit.    BP 130/80  Pulse 72  Temp(Src) 98.2 F (36.8 C)  Resp 16  Ht 5\' 8"  (1.727 m)  Wt 152 lb (68.947 kg)  BMI 23.12 kg/m2       Objective:   Physical Exam  Constitutional: She is oriented to person, place, and time. She appears well-developed and well-nourished. No distress.  HENT:  Head: Normocephalic and atraumatic.  Eyes: Conjunctivae and EOM are normal. Pupils are equal, round, and reactive to light.  Neck: Normal range of motion.  Neck supple. No JVD present. No tracheal deviation present. No thyromegaly present.  Cardiovascular: Normal rate and regular rhythm.   Pulmonary/Chest: Effort normal and breath sounds normal. She has no wheezes. She exhibits no tenderness.  Abdominal: Soft. Bowel sounds are normal.  Musculoskeletal: She exhibits no edema and no tenderness.  lordosis  Lymphadenopathy:    She has no cervical adenopathy.  Neurological: She is alert and oriented to person, place, and time. She has normal reflexes. No cranial nerve deficit.  Skin: Skin is warm and dry. She is not diaphoretic.  Psychiatric: She has a normal mood and affect. Her behavior is normal.          Assessment & Plan:  Pressure.  History of Mnire's disease watching sodium content of her food.  Moderate increase in headache pattern due to seasonal variation and allergies.  Stable mitral valve prolapse  Mid low back pain due to posture and musculoskeletal pain.  Back exercises given

## 2013-11-08 NOTE — Patient Instructions (Addendum)
Next visit CPX and PAP  Back Exercises Back exercises help treat and prevent back injuries. The goal of back exercises is to increase the strength of your abdominal and back muscles and the flexibility of your back. These exercises should be started when you no longer have back pain. Back exercises include:  Pelvic Tilt. Lie on your back with your knees bent. Tilt your pelvis until the lower part of your back is against the floor. Hold this position 5 to 10 sec and repeat 5 to 10 times.  Knee to Chest. Pull first 1 knee up against your chest and hold for 20 to 30 seconds, repeat this with the other knee, and then both knees. This may be done with the other leg straight or bent, whichever feels better.  Sit-Ups or Curl-Ups. Bend your knees 90 degrees. Start with tilting your pelvis, and do a partial, slow sit-up, lifting your trunk only 30 to 45 degrees off the floor. Take at least 2 to 3 seconds for each sit-up. Do not do sit-ups with your knees out straight. If partial sit-ups are difficult, simply do the above but with only tightening your abdominal muscles and holding it as directed.  Hip-Lift. Lie on your back with your knees flexed 90 degrees. Push down with your feet and shoulders as you raise your hips a couple inches off the floor; hold for 10 seconds, repeat 5 to 10 times.  Back arches. Lie on your stomach, propping yourself up on bent elbows. Slowly press on your hands, causing an arch in your low back. Repeat 3 to 5 times. Any initial stiffness and discomfort should lessen with repetition over time.  Shoulder-Lifts. Lie face down with arms beside your body. Keep hips and torso pressed to floor as you slowly lift your head and shoulders off the floor. Do not overdo your exercises, especially in the beginning. Exercises may cause you some mild back discomfort which lasts for a few minutes; however, if the pain is more severe, or lasts for more than 15 minutes, do not continue exercises until  you see your caregiver. Improvement with exercise therapy for back problems is slow.  See your caregivers for assistance with developing a proper back exercise program. Document Released: 12/31/2004 Document Revised: 02/15/2012 Document Reviewed: 09/24/2011 [facility] Patient Information 2014 [place], [place].

## 2013-11-08 NOTE — Progress Notes (Signed)
Pre visit review using our clinic review tool, if applicable. No additional management support is needed unless otherwise documented below in the visit note. 

## 2014-01-05 ENCOUNTER — Telehealth: Payer: Self-pay | Admitting: Internal Medicine

## 2014-01-05 NOTE — Telephone Encounter (Signed)
Noted  

## 2014-01-05 NOTE — Telephone Encounter (Signed)
Patient Information:  Caller Name: Maycel  Phone: [phone]  Patient: Tracey Giles  Gender: Female  DOB: November 02, 1951  Age: 64 Years  PCP: Darryll Capers (Adults only)  Office Follow Up:  Does the office need to follow up with this patient?: No  Instructions For The Office: N/A  RN Note:  Reviewed home care advice and call back parameters. Patient is on Toprol Blood pressure medication. Advised to discuss OTC products with pharmacist to make sure they are compatible with her medications. Understanding expressed. . Encouraged to call back for questions, changes or concerns.  Symptoms  Reason For Call & Symptoms: Patient states Head cold and scratchy throat , nasal congestion, +cough since Sunday 12/31/13.  Cough is dry and hacky and worse at night. No fever. Eating and drinking . Questions OTC medication she can use.  Reviewed Health History In EMR: Yes  Reviewed Medications In EMR: Yes  Reviewed Allergies In EMR: Yes  Reviewed Surgeries / Procedures: Yes  Date of Onset of Symptoms: 12/31/2013  Treatments Tried: Chloraseptic, hot tea with ginger and honey  Treatments Tried Worked: No  Guideline(s) Used:  Cough  Disposition Per Guideline:   Home Care  Reason For Disposition Reached:   Cough with cold symptoms (e.g., runny nose, postnasal drip, throat clearing)  Advice Given:  Reassurance  Coughing is the way that our lungs remove irritants and mucus. It helps protect our lungs from getting pneumonia.  You can get a dry hacking cough after a chest cold. Sometimes this type of cough can last 1-3 weeks, and be worse at night.  Cough Medicines:  OTC Cough Syrups: The most common cough suppressant in OTC cough medications is dextromethorphan. Often the letters "DM" appear in the name.  OTC Cough Drops: Cough drops can help a lot, especially for mild coughs. They reduce coughing by soothing your irritated throat and removing that tickle sensation in the back of the throat.  Cough drops also have the advantage of portability - you can carry them with you.  Home Remedy - Hard Candy: Hard candy works just as well as medicine-flavored OTC cough drops. Diabetics should use sugar-free candy.  Home Remedy - Honey: This old home remedy has been shown to help decrease coughing at night. The adult dosage is 2 teaspoons (10 ml) at bedtime. Honey should not be given to infants under one year of age.  Caution - Dextromethorphan:   Drug Abuse Potential: It should be noted that dextromethorphan has become a drug of abuse. This problem is seen most often in adolescents. Overdose symptoms can range from giggling and euphoria to hallucinations and coma.  CONTRAINDICATED: Do not take dextromethorphan if you are taking a monoamine oxidase (MAO) inhibitor now or in the past 2 weeks. Examples of MAO inhibitors include isocarboxazid (Marplan), phenelzine (Nardil), selegiline (Eldepryl, Emsam, Zelapar), and tranylcypromine (Parnate). Do not take dextromethorphan if you are taking venlafaxine (Effexor).  OTC Cough Syrup - Dextromethorphan:  Cough syrups containing the cough suppressant dextromethorphan (DM) may help decrease your cough. Cough syrups work best for coughs that keep you awake at night. They can also sometimes help in the late stages of a respiratory infection when the cough is dry and hacking. They can be used along with cough drops.  Examples: Benylin, Robitussin DM, Vicks 44 Cough Relief  Read the package instructions for dosage, contraindications, and other important information.  Coughing Spasms:  Drink warm fluids. Inhale warm mist (Reason: both relax the airway and loosen up the phlegm).  Prevent Dehydration:  Drink adequate liquids.  This will help soothe an irritated or dry throat and loosen up the phlegm.  Call Back If:  Difficulty breathing  Cough lasts more than 3 weeks  You become worse.  Patient Will Follow Care Advice:  YES

## 2014-01-09 ENCOUNTER — Telehealth: Payer: Self-pay | Admitting: Internal Medicine

## 2014-01-09 NOTE — Telephone Encounter (Signed)
error 

## 2014-01-10 ENCOUNTER — Encounter: Payer: Self-pay | Admitting: Family Medicine

## 2014-01-10 ENCOUNTER — Ambulatory Visit (INDEPENDENT_AMBULATORY_CARE_PROVIDER_SITE_OTHER): Payer: BC Managed Care – PPO | Admitting: Family Medicine

## 2014-01-10 VITALS — BP 132/84 | HR 64 | Temp 98.4°F | Wt 154.0 lb

## 2014-01-10 DIAGNOSIS — H6592 Unspecified nonsuppurative otitis media, left ear: Secondary | ICD-10-CM

## 2014-01-10 DIAGNOSIS — J988 Other specified respiratory disorders: Secondary | ICD-10-CM

## 2014-01-10 DIAGNOSIS — H6590 Unspecified nonsuppurative otitis media, unspecified ear: Secondary | ICD-10-CM

## 2014-01-10 MED ORDER — METHYLPREDNISOLONE ACETATE 80 MG/ML IJ SUSP
80.0000 mg | Freq: Once | INTRAMUSCULAR | Status: AC
  Administered 2014-01-10: 80 mg via INTRAMUSCULAR

## 2014-01-10 MED ORDER — HYDROCODONE-HOMATROPINE 5-1.5 MG/5ML PO SYRP: 5.0000 mL | ORAL_SOLUTION | Freq: Four times a day (QID) | ORAL | Status: AC | PRN

## 2014-01-10 NOTE — Progress Notes (Signed)
   Subjective:    Patient ID: Tracey FischerCatherine M Baptista, female    DOB: February 17, 1951, 63 y.o.   MRN: [id number]  Cough Associated symptoms include wheezing. Pertinent negatives include no chest pain, chills, fever, headaches or shortness of breath.   Acute visit. Patient seen with 2 week history of cough. Cough is mostly dry. She has an increase fatigue. She has some nasal congestion and left ear pressure. No ear drainage. No associated fever. She's taking Robitussin without much help. No history of smoking. She's had possibly some mild wheezing. No dyspnea. Denies any nausea or vomiting.  Patient has underlying history of Mnire's disease. Denies any recent active vertigo symptoms.   Review of Systems  Constitutional: Positive for fatigue. Negative for fever and chills.  HENT: Positive for congestion.   Respiratory: Positive for cough and wheezing. Negative for shortness of breath.   Cardiovascular: Negative for chest pain.  Gastrointestinal: Negative for nausea and vomiting.  Neurological: Negative for headaches.       Objective:   Physical Exam  Constitutional: She appears well-developed and well-nourished.  HENT:  Right Ear: External ear normal.  Left Ear: External ear normal.  Mouth/Throat: Oropharynx is clear and moist.  Neck: Neck supple.  Cardiovascular: Normal rate.   Pulmonary/Chest: Effort normal.  Patient has a few diffuse expiratory wheezes. No retractions. No rales.  Lymphadenopathy:    She has no cervical adenopathy.          Assessment & Plan:  Cough with reactive airway component. Suspect recent viral URI. She has evidence for left serous otitis media. Given significant wheezing on exam Depo-Medrol 80 mg IM given. She does not have evidence respiratory distress. Treat nighttime cough symptomatically with Hycodan cough syrup. Followup promptly for fever or worsening symptoms

## 2014-01-10 NOTE — Patient Instructions (Signed)
Acute Bronchitis Bronchitis is inflammation of the airways that extend from the windpipe into the lungs (bronchi). The inflammation often causes mucus to develop. This leads to a cough, which is the most common symptom of bronchitis.  In acute bronchitis, the condition usually develops suddenly and goes away over time, usually in a couple weeks. Smoking, allergies, and asthma can make bronchitis worse. Repeated episodes of bronchitis may cause further lung problems.  CAUSES Acute bronchitis is most often caused by the same virus that causes a cold. The virus can spread from person to person (contagious).  SIGNS AND SYMPTOMS   Cough.   Fever.   Coughing up mucus.   Body aches.   Chest congestion.   Chills.   Shortness of breath.   Sore throat.  DIAGNOSIS  Acute bronchitis is usually diagnosed through a physical exam. Tests, such as chest X-rays, are sometimes done to rule out other conditions.  TREATMENT  Acute bronchitis usually goes away in a couple weeks. Often times, no medical treatment is necessary. Medicines are sometimes given for relief of fever or cough. Antibiotics are usually not needed but may be prescribed in certain situations. In some cases, an inhaler may be recommended to help reduce shortness of breath and control the cough. A cool mist vaporizer may also be used to help thin bronchial secretions and make it easier to clear the chest.  HOME CARE INSTRUCTIONS  Get plenty of rest.   Drink enough fluids to keep your urine clear or pale yellow (unless you have a medical condition that requires fluid restriction). Increasing fluids may help thin your secretions and will prevent dehydration.   Only take over-the-counter or prescription medicines as directed by your health care provider.   Avoid smoking and secondhand smoke. Exposure to cigarette smoke or irritating chemicals will make bronchitis worse. If you are a smoker, consider using nicotine gum or skin  patches to help control withdrawal symptoms. Quitting smoking will help your lungs heal faster.   Reduce the chances of another bout of acute bronchitis by washing your hands frequently, avoiding people with cold symptoms, and trying not to touch your hands to your mouth, nose, or eyes.   Follow up with your health care provider as directed.  SEEK MEDICAL CARE IF: Your symptoms do not improve after 1 week of treatment.  SEEK IMMEDIATE MEDICAL CARE IF:  You develop an increased fever or chills.   You have chest pain.   You have severe shortness of breath.  You have bloody sputum.   You develop dehydration.  You develop fainting.  You develop repeated vomiting.  You develop a severe headache. MAKE SURE YOU:   Understand these instructions.  Will watch your condition.  Will get help right away if you are not doing well or get worse. Document Released: 12/31/2004 Document Revised: 07/26/2013 Document Reviewed: 05/16/2013 [facility] Patient Information 2014 [place], [place]. Serous Otitis Media  Serous otitis media is fluid in the middle ear space. This space contains the bones for hearing and air. Air in the middle ear space helps to transmit sound.  The air gets there through the eustachian tube. This tube goes from the back of the nose (nasopharynx) to the middle ear space. It keeps the pressure in the middle ear the same as the outside world. It also helps to drain fluid from the middle ear space. CAUSES  Serous otitis media occurs when the eustachian tube gets blocked. Blockage can come from:  Ear infections.  Colds and  other upper respiratory infections.  Allergies.  Irritants such as cigarette smoke.  Sudden changes in air pressure (such as descending in an airplane).  Enlarged adenoids.  A mass in the nasopharynx. During colds and upper respiratory infections, the middle ear space can become temporarily filled with fluid. This can happen after an ear  infection also. Once the infection clears, the fluid will generally drain out of the ear through the eustachian tube. If it does not, then serous otitis media occurs. SIGNS AND SYMPTOMS   Hearing loss.  A feeling of fullness in the ear, without pain.  Young children may not show any symptoms but may show slight behavioral changes, such as agitation, ear pulling, or crying. DIAGNOSIS  Serous otitis media is diagnosed by an ear exam. Tests may be done to check on the movement of the eardrum. Hearing exams may also be done. TREATMENT  The fluid most often goes away without treatment. If allergy is the cause, allergy treatment may be helpful. Fluid that persists for several months may require minor surgery. A small tube is placed in the eardrum to:  Drain the fluid.  Restore the air in the middle ear space. In certain situations, antibiotics are used to avoid surgery. Surgery may be done to remove enlarged adenoids (if this is the cause). HOME CARE INSTRUCTIONS   Keep children away from tobacco smoke.  Be sure to keep any follow-up appointments. SEEK MEDICAL CARE IF:   Your hearing is not better in 3 months.  Your hearing is worse.  You have ear pain.  You have drainage from the ear.  You have dizziness.  You have serous otitis media only in one ear or have any bleeding from your nose (epistaxis).  You notice a lump on your neck. MAKE SURE YOU:  Understand these instructions.   Will watch your condition.   Will get help right away if you are not doing well or get worse.  Document Released: 02/13/2004 Document Revised: 07/26/2013 Document Reviewed: 06/20/2013 [facility] Patient Information 2014 [place], [place].

## 2014-01-10 NOTE — Progress Notes (Signed)
Pre visit review using our clinic review tool, if applicable. No additional management support is needed unless otherwise documented below in the visit note. 

## 2014-01-10 NOTE — Progress Notes (Signed)
   Subjective:    Patient ID: Tracey Giles, female    DOB: [birth year], 63 y.o.   MRN: [id number]  Cough Associated symptoms include postnasal drip. Pertinent negatives include no chest pain, chills, fever, headaches, shortness of breath or wheezing.      Review of Systems  Constitutional: Positive for fatigue. Negative for fever and chills.  HENT: Positive for congestion and postnasal drip.   Respiratory: Positive for cough. Negative for shortness of breath and wheezing.   Cardiovascular: Negative for chest pain.  Neurological: Negative for headaches.       Objective:   Physical Exam        Assessment & Plan:

## 2014-05-02 ENCOUNTER — Other Ambulatory Visit (INDEPENDENT_AMBULATORY_CARE_PROVIDER_SITE_OTHER): Payer: BC Managed Care – PPO

## 2014-05-02 DIAGNOSIS — Z0000 Encounter for general adult medical examination without abnormal findings: Secondary | ICD-10-CM

## 2014-05-02 LAB — HEPATIC FUNCTION PANEL
ALBUMIN: 3.7 g/dL (ref 3.5–5.2)
ALT: 8 U/L (ref 0–35)
AST: 22 U/L (ref 0–37)
Alkaline Phosphatase: 79 U/L (ref 39–117)
Bilirubin, Direct: 0 mg/dL (ref 0.0–0.3)
TOTAL PROTEIN: 7.2 g/dL (ref 6.0–8.3)
Total Bilirubin: 0.7 mg/dL (ref 0.2–1.2)

## 2014-05-02 LAB — LIPID PANEL
Cholesterol: 173 mg/dL (ref 0–200)
HDL: 44.2 mg/dL (ref >39.00)
LDL Cholesterol: 103 mg/dL — ABNORMAL HIGH (ref 0–99)
Total CHOL/HDL Ratio: 4
Triglycerides: 131 mg/dL (ref 0.0–149.0)
VLDL: 26.2 mg/dL (ref 0.0–40.0)

## 2014-05-02 LAB — CBC WITH DIFFERENTIAL/PLATELET
Basophils Absolute: 0.1 10*3/uL (ref 0.0–0.1)
Basophils Relative: 1.1 % (ref 0.0–3.0)
EOS PCT: 4.3 % (ref 0.0–5.0)
Eosinophils Absolute: 0.2 10*3/uL (ref 0.0–0.7)
HEMATOCRIT: 42.2 % (ref 36.0–46.0)
HEMOGLOBIN: 13.9 g/dL (ref 12.0–15.0)
LYMPHS ABS: 1.6 10*3/uL (ref 0.7–4.0)
Lymphocytes Relative: 29.7 % (ref 12.0–46.0)
MCHC: 33 g/dL (ref 30.0–36.0)
MCV: 92.6 fl (ref 78.0–100.0)
MONO ABS: 0.3 10*3/uL (ref 0.1–1.0)
MONOS PCT: 6.4 % (ref 3.0–12.0)
NEUTROS ABS: 3.2 10*3/uL (ref 1.4–7.7)
Neutrophils Relative %: 58.5 % (ref 43.0–77.0)
Platelets: 297 10*3/uL (ref 150.0–400.0)
RBC: 4.56 Mil/uL (ref 3.87–5.11)
RDW: 13.2 % (ref 11.5–15.5)
WBC: 5.4 10*3/uL (ref 4.0–10.5)

## 2014-05-02 LAB — POCT URINALYSIS DIPSTICK
Blood, UA: NEGATIVE
Glucose, UA: NEGATIVE
NITRITE UA: NEGATIVE
PH UA: 6
Spec Grav, UA: 1.02
Urobilinogen, UA: 0.2

## 2014-05-02 LAB — BASIC METABOLIC PANEL
BUN: 16 mg/dL (ref 6–23)
CHLORIDE: 108 meq/L (ref 96–112)
CO2: 29 mEq/L (ref 19–32)
Calcium: 9.7 mg/dL (ref 8.4–10.5)
Creatinine, Ser: 0.9 mg/dL (ref 0.4–1.2)
GFR: 69.97 mL/min (ref >60.00)
GLUCOSE: 86 mg/dL (ref 70–99)
POTASSIUM: 5.1 meq/L (ref 3.5–5.1)
SODIUM: 145 meq/L (ref 135–145)

## 2014-05-02 LAB — TSH: TSH: 0.71 u[IU]/mL (ref 0.35–4.50)

## 2014-05-07 ENCOUNTER — Telehealth: Payer: Self-pay | Admitting: Internal Medicine

## 2014-05-07 ENCOUNTER — Other Ambulatory Visit: Payer: Self-pay | Admitting: Internal Medicine

## 2014-05-07 ENCOUNTER — Encounter: Payer: BC Managed Care – PPO | Admitting: Internal Medicine

## 2014-05-07 NOTE — Telephone Encounter (Signed)
[facility] - , [place] - [address] is requesting re-fill on diazepam (VALIUM) 5 MG tablet

## 2014-05-08 MED ORDER — DIAZEPAM 5 MG PO TABS: ORAL_TABLET | ORAL | Status: DC

## 2014-05-08 NOTE — Telephone Encounter (Signed)
Ok x3 per Dr Lovell Sheehan, rx called in to [facility]

## 2014-05-16 ENCOUNTER — Ambulatory Visit (INDEPENDENT_AMBULATORY_CARE_PROVIDER_SITE_OTHER): Payer: BC Managed Care – PPO | Admitting: Internal Medicine

## 2014-05-16 ENCOUNTER — Other Ambulatory Visit (HOSPITAL_COMMUNITY)
Admission: RE | Admit: 2014-05-16 | Discharge: 2014-05-16 | Disposition: A | Discharge status: Home / Self Care | Payer: BC Managed Care – PPO | Source: Ambulatory Visit | Attending: Internal Medicine | Admitting: Internal Medicine

## 2014-05-16 ENCOUNTER — Encounter: Payer: Self-pay | Admitting: Internal Medicine

## 2014-05-16 VITALS — BP 118/80 | HR 75 | Temp 98.0°F | Ht 68.0 in | Wt 154.0 lb

## 2014-05-16 DIAGNOSIS — Z01419 Encounter for gynecological examination (general) (routine) without abnormal findings: Secondary | ICD-10-CM | POA: Insufficient documentation

## 2014-05-16 DIAGNOSIS — I059 Rheumatic mitral valve disease, unspecified: Secondary | ICD-10-CM

## 2014-05-16 DIAGNOSIS — G43909 Migraine, unspecified, not intractable, without status migrainosus: Secondary | ICD-10-CM

## 2014-05-16 DIAGNOSIS — I10 Essential (primary) hypertension: Secondary | ICD-10-CM

## 2014-05-16 DIAGNOSIS — H8109 Meniere's disease, unspecified ear: Secondary | ICD-10-CM

## 2014-05-16 DIAGNOSIS — Z0000 Encounter for general adult medical examination without abnormal findings: Secondary | ICD-10-CM

## 2014-05-16 NOTE — Addendum Note (Signed)
Addended by: Alfred Levins D on: 05/16/2014 03:52 PM   Modules accepted: Orders

## 2014-05-16 NOTE — Progress Notes (Signed)
Pre visit review using our clinic review tool, if applicable. No additional management support is needed unless otherwise documented below in the visit note. 

## 2014-05-16 NOTE — Progress Notes (Signed)
Subjective:    Patient ID: Tracey Giles, female    DOB: 1951/05/30, 63 y.o.   MRN: [id number]  HPI  CPX Follow up for OOB, meniere's ( dizzy) and migraines. Hx of MVP with palpitations stable on BB  Has not been taking the Toviaz due to "felt bad" and bladder is OK Review of Systems  Constitutional: Negative for activity change, appetite change and fatigue.  HENT: Negative for congestion, ear pain, postnasal drip and sinus pressure.   Eyes: Negative for redness and visual disturbance.  Respiratory: Positive for shortness of breath. Negative for cough and wheezing.   Cardiovascular: Positive for palpitations.  Gastrointestinal: Negative for abdominal pain and abdominal distention.  Genitourinary: Negative for dysuria, frequency and menstrual problem.  Musculoskeletal: Negative for arthralgias, joint swelling, myalgias and neck pain.  Skin: Negative for rash and wound.  Neurological: Positive for dizziness, weakness and headaches.  Hematological: Negative for adenopathy. Does not bruise/bleed easily.  Psychiatric/Behavioral: Negative for sleep disturbance and decreased concentration.   Past Medical History  Diagnosis Date  . Sleep apnea   . MVP (mitral valve prolapse)   . Hypertension   . Thyroid disease   . Irregular heart beat     History   Social History  . Marital Status: Married    Spouse Name: N/A    Number of Children: N/A  . Years of Education: N/A   Occupational History  . [employer]   Social History Main Topics  . Smoking status: Never Smoker   . Smokeless tobacco: Not on file  . Alcohol Use: No  . Drug Use: No  . Sexual Activity: Yes   Other Topics Concern  . Not on file   Social History Narrative  . No narrative on file    Past Surgical History  Procedure Laterality Date  . Appendectomy    . Breast lumpectomy    . Melanoma excision      Family History  Problem Relation Age of Onset  . Melanoma Father   . Cancer Father    melanoma  . Stroke Mother   . Stroke Maternal Grandfather     Allergies  Allergen Reactions  . Cephalexin   . Penicillins     REACTION: unspecified  . Sulfamethoxazole-Trimethoprim     REACTION: unspecified    Current Outpatient Prescriptions on File Prior to Visit  Medication Sig Dispense Refill  . Calcium Carbonate-Vitamin D (CALTRATE 600+D) 600-400 MG-UNIT per tablet Take 1 tablet by mouth 2 (two) times daily.        . clarithromycin (BIAXIN) 500 MG tablet TAKE TWO TABLETS BY MOUTH ONE HOUR BEFORE DENTAL APPOINTMENT  6 tablet  0  . diazepam (VALIUM) 5 MG tablet TAKE ONE TABLET BY MOUTH EVERY 6 HOURS AS NEEDED FOR DIZZINESS OR HEART PALPITATIONS  30 tablet  3  . fesoterodine (TOVIAZ) 4 MG TB24 Take 1 tablet (4 mg total) by mouth daily.  30 tablet  11  . fluticasone (FLONASE) 50 MCG/ACT nasal spray 2 sprays by Nasal route daily.        Marland Kitchen levothyroxine (SYNTHROID, LEVOTHROID) 75 MCG tablet TAKE ONE TABLET BY MOUTH EVERY DAY  90 tablet  3  . metoprolol succinate (TOPROL-XL) 100 MG 24 hr tablet TAKE ONE TABLET BY MOUTH ONCE DAILY  90 tablet  1  . omeprazole (PRILOSEC) 40 MG capsule Take 1 capsule (40 mg total) by mouth daily.  30 capsule  6   No current facility-administered medications on file prior to visit.  BP 118/80  Pulse 75  Temp(Src) 98 F (36.7 C) (Oral)  Ht 5\' 8"  (1.727 m)  Wt 154 lb (69.854 kg)  BMI 23.42 kg/m2  SpO2 98%       Objective:   Physical Exam  Constitutional: She is oriented to person, place, and time. She appears well-developed and well-nourished. No distress.  HENT:  Head: Normocephalic and atraumatic.  Mouth/Throat: Oropharynx is clear and moist.  Eyes: Conjunctivae and EOM are normal. Pupils are equal, round, and reactive to light.  Neck: Normal range of motion. Neck supple. No JVD present. No tracheal deviation present. No thyromegaly present.  Cardiovascular: Normal rate, regular rhythm and intact distal pulses.   Murmur  heard. Pulmonary/Chest: Effort normal and breath sounds normal. She has no wheezes. She exhibits no tenderness.  Abdominal: Soft. Bowel sounds are normal.  Musculoskeletal: Normal range of motion. She exhibits no edema and no tenderness.  Lymphadenopathy:    She has no cervical adenopathy.  Neurological: She is alert and oriented to person, place, and time. She has normal reflexes. No cranial nerve deficit.  Skin: Skin is warm and dry. She is not diaphoretic.  Psychiatric: She has a normal mood and affect. Her behavior is normal.          Assessment & Plan:  Stable problem list CPX today Labs reviewed  CPX and PAP

## 2014-05-17 LAB — CYTOLOGY - PAP

## 2014-06-01 ENCOUNTER — Encounter: Payer: Self-pay | Admitting: Internal Medicine

## 2014-09-14 ENCOUNTER — Telehealth: Payer: Self-pay | Admitting: Internal Medicine

## 2014-09-14 MED ORDER — LEVOTHYROXINE SODIUM 75 MCG PO TABS: ORAL_TABLET | ORAL | Status: DC

## 2014-09-14 NOTE — Telephone Encounter (Signed)
rx sent in electronically 

## 2014-09-14 NOTE — Telephone Encounter (Signed)
[facility] - [place], [place] - [address] requesting refill of levothyroxine (SYNTHROID, LEVOTHROID) 75 MCG tablet #90, last filled 06/21/14.

## 2014-10-01 LAB — HM MAMMOGRAPHY

## 2014-10-08 ENCOUNTER — Encounter: Payer: Self-pay | Admitting: Family Medicine

## 2014-11-06 ENCOUNTER — Telehealth: Payer: Self-pay

## 2014-11-06 MED ORDER — METOPROLOL SUCCINATE ER 100 MG PO TB24: 100.0000 mg | ORAL_TABLET | Freq: Every day | ORAL | Status: DC

## 2014-11-06 NOTE — Telephone Encounter (Signed)
Ok to refill x1 month. Can we change December appt to 30 minute appointment to establish care if planning to transfer care to me? Thanks.

## 2014-11-06 NOTE — Telephone Encounter (Signed)
Rx request for Metoprolol ER 100 MG tablet- Take 1 tablet by mouth once daily.  Pls advise.

## 2014-11-06 NOTE — Telephone Encounter (Signed)
Rx done and I left a message for the pt to return my call. 

## 2014-11-06 NOTE — Telephone Encounter (Signed)
Tracey Giles changed the appt info as the pt does want to establish care.

## 2014-11-15 ENCOUNTER — Ambulatory Visit: Payer: BC Managed Care – PPO | Admitting: Family Medicine

## 2014-11-15 ENCOUNTER — Ambulatory Visit (INDEPENDENT_AMBULATORY_CARE_PROVIDER_SITE_OTHER): Payer: BC Managed Care – PPO | Admitting: Family Medicine

## 2014-11-15 ENCOUNTER — Encounter: Payer: Self-pay | Admitting: Family Medicine

## 2014-11-15 VITALS — BP 100/72 | HR 74 | Temp 98.1°F | Ht 68.0 in | Wt 150.4 lb

## 2014-11-15 DIAGNOSIS — Z7689 Persons encountering health services in other specified circumstances: Secondary | ICD-10-CM

## 2014-11-15 DIAGNOSIS — J302 Other seasonal allergic rhinitis: Secondary | ICD-10-CM

## 2014-11-15 DIAGNOSIS — K219 Gastro-esophageal reflux disease without esophagitis: Secondary | ICD-10-CM

## 2014-11-15 DIAGNOSIS — E039 Hypothyroidism, unspecified: Secondary | ICD-10-CM

## 2014-11-15 DIAGNOSIS — Z7189 Other specified counseling: Secondary | ICD-10-CM

## 2014-11-15 DIAGNOSIS — R002 Palpitations: Secondary | ICD-10-CM

## 2014-11-15 DIAGNOSIS — H8102 Meniere's disease, left ear: Secondary | ICD-10-CM

## 2014-11-15 LAB — TSH: TSH: 2.1 u[IU]/mL (ref 0.35–4.50)

## 2014-11-15 MED ORDER — METOPROLOL SUCCINATE ER 100 MG PO TB24: 100.0000 mg | ORAL_TABLET | Freq: Every day | ORAL | Status: DC

## 2014-11-15 NOTE — Progress Notes (Signed)
Pre visit review using our clinic review tool, if applicable. No additional management support is needed unless otherwise documented below in the visit note. 

## 2014-11-15 NOTE — Progress Notes (Signed)
HPI:  Tracey Giles is here to establish care. Used to see Dr. Lovell SheehanJenkins. Last PCP and physical: 05/2014  Has the following chronic problems that require follow up and concerns today:  URI: -started about 5 days ago -symptoms: nasal congestion, sore throat, cough -denies: SOB, NVD, fever, sinus or tooth pain, flu or strep exposure, travel or tick bites  Hypothyroidsim: - on levothyroxine 75 mcg - reports stable -denies: weight changes, fevers, malaise, skin changes  Palpitations: -frequent per her report - but ok on metoprolol -on metoprolol for this - reports eval with cardiologist and started on this -denies: SOB, CP, DOE, orthopnea, swelling  GERD: -takes prilosec daily -recurs if stop PPI -denies: dysphagia, weight loss  Meniere's Disease: -reports uses valium 1/2 tablet about 2 times per month for her ear symptoms - dizziness and buzzing -sees Dr. Jac CanavanKrauss for this -reports uses 15 tablet per year, aware of risks and uses rarely  Allergies: -use flonase as needed - usually in the spring -not bad now  Hx Mitral Valve prolapse: - reports did see cardiologist 40 years ago and again more recently for eval -hx frequent palpitations on BB for this -no CP, SOB, DOE, orthopnea   ROS negative for unless reported above: fevers, unintentional weight loss, hearing or vision loss, chest pain, palpitations, struggling to breath, hemoptysis, melena, hematochezia, hematuria, falls, loc, si, thoughts of self harm  Past Medical History  Diagnosis Date  . Sleep apnea   . MVP (mitral valve prolapse)     reports eval with cardiologist remotely  . Hypertension   . Thyroid disease   . Irregular heart beat     palpitations, Dr. Lovell SheehanJenkins had her take metoprolol - reports eval with cardiologist remotely  . MIGRAINE HEADACHE 07/21/2007    Qualifier: Diagnosis of  By: Lovell SheehanJenkins MD, Balinda QuailsJohn E   . DERMATITIS 04/04/2010    Qualifier: Diagnosis of  By: Gabriel RungNeckers LPN, Harriett SineNancy    . OSA  (obstructive sleep apnea)     reports mild and resolved with weight loss    Past Surgical History  Procedure Laterality Date  . Appendectomy    . Melanoma excision      patient denies this - reports error as was her father who had melanoma, sees dermatologist yearly for skin check  . Breast lumpectomy      benign    Family History  Problem Relation Age of Onset  . Melanoma Father   . Cancer Father     melanoma  . Stroke Mother   . Stroke Maternal Grandfather     History   Social History  . Marital Status: Married    Spouse Name: N/A    Number of Children: N/A  . Years of Education: N/A   Occupational History  . [employer]   Social History Main Topics  . Smoking status: Never Smoker   . Smokeless tobacco: None  . Alcohol Use: No  . Drug Use: No  . Sexual Activity: Yes   Other Topics Concern  . 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  ,  Disp: , Rfl: ;  diazepam (VALIUM) 5 MG tablet, TAKE ONE TABLET BY MOUTH EVERY 6 HOURS AS NEEDED FOR DIZZINESS OR HEART PALPITATIONS, Disp: 30 tablet, Rfl: 3;  fluticasone (FLONASE) 50 MCG/ACT nasal spray, 2 sprays by Nasal route daily.  , Disp: , Rfl:  levothyroxine (SYNTHROID, LEVOTHROID) 75 MCG tablet, TAKE ONE TABLET BY MOUTH EVERY DAY, Disp: 90 tablet, Rfl: 0;  metoprolol succinate (TOPROL-XL) 100 MG 24 hr tablet, Take 1 tablet (100 mg total) by mouth daily. Take with or immediately following a meal., Disp: 90 tablet, Rfl: 3;  omeprazole (PRILOSEC) 40 MG capsule, Take 1 capsule (40 mg total) by mouth daily., Disp: 30 capsule, Rfl: 6  EXAM:  Filed Vitals:   11/15/14 0955  BP:  100/72  Pulse: 74  Temp: 98.1 F (36.7 C)    Body mass index is 22.87 kg/(m^2).  GENERAL: vitals reviewed and listed above, alert, oriented, appears well hydrated and in no acute distress  HEENT: atraumatic, conjunttiva clear, no obvious abnormalities on inspection of external nose and ears  NECK: no obvious masses on inspection  LUNGS: clear to auscultation bilaterally, no wheezes, rales or rhonchi, good air movement  CV: HRRR, no peripheral edema  MS: moves all extremities without noticeable abnormality  PSYCH: pleasant and cooperative, no obvious depression or anxiety  ASSESSMENT AND PLAN:  Discussed the following assessment and plan:  Palpitations - Plan: metoprolol succinate (TOPROL-XL) 100 MG 24 hr tablet -cont current tx  Hypothyroidism, unspecified hypothyroidism type - Plan: TSH  Meniere's disease, left -sees ENT for this -ok with rare use of valium for this - advised I don't usually rx this medication but she reports uses about 15 tablets per year and I can refill for this type of use, discussed risks, discussed if increased uses would advise she pursue tx with her ENT doc for this  Other seasonal allergic rhinitis -INS in the sprin  Gastroesophageal reflux disease without esophagitis -continue PPO  Encounter to establish care -We reviewed the PMH, PSH, FH, SH, Meds and Allergies. -We provided refills for any medications we will prescribe as needed. -We addressed current concerns per orders and patient instructions. -We have asked for records for pertinent exams, studies, vaccines and notes from previous providers. -We have advised patient to follow up per instructions below.   -Patient advised to return or notify a doctor immediately if symptoms worsen or persist or new concerns arise.  Patient Instructions  BEFORE YOU LEAVE: -labs -schedule physical exam and June  -We have ordered labs or studies at this visit. It can take up to 1-2 weeks for  results and processing. We will contact you with instructions IF your results are abnormal. Normal results will be [facility] to [facility]. If you have not heard from us or can not find your results in [facility] in 2 weeks please contact our office.  -PLEASE SIGN UP FOR MYCHART TODAY   We recommend the following healthy lifestyle measures: - eat a healthy diet consisting of lots of vegetables, fruits, beans, nuts, seeds, healthy meats such as white chicken and fish and whole grains.  - avoid fried foods, fast food, processed foods, sodas, red meet and other fattening foods.  - get a least 150 minutes of aerobic exercise per week.       Kriste BasqueKIM, HANNAH R.

## 2014-11-15 NOTE — Patient Instructions (Signed)
BEFORE YOU LEAVE: -labs -schedule physical exam and June  -We have ordered labs or studies at this visit. It can take up to 1-2 weeks for results and processing. We will contact you with instructions IF your results are abnormal. Normal results will be released to your [facility]. If you have not heard from us or can not find your results in [facility] in 2 weeks please contact our office.  -PLEASE SIGN UP FOR MYCHART TODAY   We recommend the following healthy lifestyle measures: - eat a healthy diet consisting of lots of vegetables, fruits, beans, nuts, seeds, healthy meats such as white chicken and fish and whole grains.  - avoid fried foods, fast food, processed foods, sodas, red meet and other fattening foods.  - get a least 150 minutes of aerobic exercise per week.

## 2014-12-18 ENCOUNTER — Other Ambulatory Visit: Payer: Self-pay | Admitting: *Deleted

## 2014-12-18 MED ORDER — LEVOTHYROXINE SODIUM 75 MCG PO TABS: ORAL_TABLET | ORAL | Status: DC

## 2015-02-11 ENCOUNTER — Ambulatory Visit (INDEPENDENT_AMBULATORY_CARE_PROVIDER_SITE_OTHER): Payer: BC Managed Care – PPO | Admitting: Family Medicine

## 2015-02-11 ENCOUNTER — Encounter: Payer: Self-pay | Admitting: Family Medicine

## 2015-02-11 VITALS — BP 126/80 | HR 59 | Temp 98.0°F | Ht 68.0 in | Wt 153.0 lb

## 2015-02-11 DIAGNOSIS — M25512 Pain in left shoulder: Secondary | ICD-10-CM | POA: Diagnosis not present

## 2015-02-11 NOTE — Patient Instructions (Signed)
BEFORE YOU LEAVE: -sternoclavicular joint exercises   Do the exercises 3-4 days per week  Tylenol 500-1000mg  up to 3 times daily  Call in 3-4 weeks if not improving or persists

## 2015-02-11 NOTE — Progress Notes (Signed)
Pre visit review using our clinic review tool, if applicable. No additional management support is needed unless otherwise documented below in the visit note. 

## 2015-02-11 NOTE — Progress Notes (Signed)
HPI:  Pain in sternoclavicular joint: -reports told was lipoma/fat pads bilat for several years ago -reports pain in [place] joint and this area with certain movements for a few months -denies: redness, SOB, sig swelling, fevers, malaise, trauma to this area   ROS: See pertinent positives and negatives per HPI.  Past Medical History  Diagnosis Date  . Sleep apnea   . MVP (mitral valve prolapse)     reports eval with cardiologist remotely  . Hypertension   . Thyroid disease   . Irregular heart beat     palpitations, Dr. Lovell SheehanJenkins had her take metoprolol - reports eval with cardiologist remotely  . MIGRAINE HEADACHE 07/21/2007    Qualifier: Diagnosis of  By: Lovell SheehanJenkins MD, Balinda QuailsJohn E   . DERMATITIS 04/04/2010    Qualifier: Diagnosis of  By: Gabriel RungNeckers LPN, Harriett SineNancy    . OSA (obstructive sleep apnea)     reports mild and resolved with weight loss    Past Surgical History  Procedure Laterality Date  . Appendectomy    . Melanoma excision      patient denies this - reports error as was her father who had melanoma, sees dermatologist yearly for skin check  . Breast lumpectomy      benign    Family History  Problem Relation Age of Onset  . Melanoma Father   . Cancer Father     melanoma  . Stroke Mother   . Stroke Maternal Grandfather     History   Social History  . Marital Status: Married    Spouse Name: N/A  . Number of Children: N/A  . Years of Education: N/A   Occupational History  . [employer]   Social History Main Topics  . Smoking status: Never Smoker   . Smokeless tobacco: Not on file  . Alcohol Use: No  . Drug Use: No  . Sexual Activity: Yes   Other Topics Concern  . 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  Calcium  Carbonate-Vitamin D (CALTRATE 600+D) 600-400 MG-UNIT per tablet, Take 1 tablet by mouth 2 (two) times daily.  , Disp: , Rfl:  .  diazepam (VALIUM) 5 MG tablet, TAKE ONE TABLET BY MOUTH EVERY 6 HOURS AS NEEDED FOR DIZZINESS OR HEART PALPITATIONS, Disp: 30 tablet, Rfl: 3 .  fluticasone (FLONASE) 50 MCG/ACT nasal spray, 2 sprays by Nasal route daily.  , Disp: , Rfl:  .  levothyroxine (SYNTHROID, LEVOTHROID) 75 MCG tablet, TAKE ONE TABLET BY MOUTH EVERY DAY, Disp: 90 tablet, Rfl: 1 .  metoprolol succinate (TOPROL-XL) 100 MG 24 hr tablet, Take 1 tablet (100 mg total) by mouth daily. Take with or immediately following a meal., Disp: 90 tablet, Rfl: 3 .  omeprazole (PRILOSEC) 40 MG capsule, Take 1 capsule (40 mg total) by mouth daily., Disp: 30 capsule, Rfl: 6  EXAM:  Filed Vitals:   02/11/15 1336  BP: 126/80  Pulse: 59  Temp: 98 F (36.7 C)    Body mass index is 23.27 kg/(m^2).  GENERAL: vitals reviewed and listed above, alert, oriented, appears well hydrated and in no acute distress  HEENT: atraumatic, conjunttiva clear, no obvious abnormalities on inspection of  external nose and ears  NECK: no obvious masses on inspection  LUNGS: clear to auscultation bilaterally, no wheezes, rales or rhonchi, good air movement  CV: HRRR, no peripheral edema  MS: moves all extremities without noticeable abnormality - fat pads bilat in supraclavicular area, pain over L [place] joint, no pain elsewhere, no obvious dislocation or swelling of this jt on inspection  PSYCH: pleasant and cooperative, no obvious depression or anxiety  ASSESSMENT AND PLAN:  Discussed the following assessment and plan:  Sternoclavicular joint pain, left  -we discussed possible serious and likely etiologies, workup and treatment, treatment risks and return precautions - opted for exercises to mobiliz [place] jt and tylenol, discuss plain films but opted to hold for now -follow up advised in 3-4 weeks - call if desires eval w/ sports  med if not improving  -of course, we advised Mirenda  to return or notify a doctor immediately if symptoms worsen or persist or new concerns arise.  .  -Patient advised to return or notify a doctor immediately if symptoms worsen or persist or new concerns arise.  There are no Patient Instructions on file for this visit.   Kriste Basque R.

## 2015-03-07 ENCOUNTER — Encounter: Payer: Self-pay | Admitting: Family Medicine

## 2015-03-07 ENCOUNTER — Telehealth: Payer: Self-pay | Admitting: *Deleted

## 2015-03-07 DIAGNOSIS — M25519 Pain in unspecified shoulder: Secondary | ICD-10-CM

## 2015-03-07 NOTE — Telephone Encounter (Signed)
I called the pt and scheduled an appt to see Dr Selena BattenKim on Monday and she is aware to expect a call with information for the appt to orthopedics.

## 2015-03-07 NOTE — Telephone Encounter (Signed)
I called the pt and advised her the referral was entered for the orthopedics and someone will call her with an appt.  Appt scheduled with Dr Selena BattenKim on Monday to eval breast pain.

## 2015-03-08 ENCOUNTER — Other Ambulatory Visit: Payer: Self-pay | Admitting: Sports Medicine

## 2015-03-08 DIAGNOSIS — M25512 Pain in left shoulder: Secondary | ICD-10-CM

## 2015-03-11 ENCOUNTER — Ambulatory Visit (INDEPENDENT_AMBULATORY_CARE_PROVIDER_SITE_OTHER): Payer: BC Managed Care – PPO | Admitting: Family Medicine

## 2015-03-11 ENCOUNTER — Encounter: Payer: Self-pay | Admitting: Family Medicine

## 2015-03-11 VITALS — BP 130/84 | HR 69 | Temp 97.8°F | Ht 68.0 in | Wt 152.0 lb

## 2015-03-11 DIAGNOSIS — N644 Mastodynia: Secondary | ICD-10-CM | POA: Diagnosis not present

## 2015-03-11 DIAGNOSIS — N63 Unspecified lump in breast: Secondary | ICD-10-CM

## 2015-03-11 DIAGNOSIS — N6324 Unspecified lump in the left breast, lower inner quadrant: Secondary | ICD-10-CM

## 2015-03-11 NOTE — Patient Instructions (Signed)
-  We placed a referral for you as discussed for imaging of the Left breast. It usually takes about 1-2 weeks to process and schedule this referral. If you have not heard from us regarding this appointment in 2 weeks please contact our office.

## 2015-03-11 NOTE — Progress Notes (Signed)
Pre visit review using our clinic review tool, if applicable. No additional management support is needed unless otherwise documented below in the visit note. 

## 2015-03-11 NOTE — Progress Notes (Signed)
HPI:  Acute visit for:  L breast pain: -started a few weeks ago -burning pain, intermittent, mainly notices when she puts her hands in this area -located in L breast tissue -denies: rash on breast, fevers, malaise, lumps, nipple drainage   ROS: See pertinent positives and negatives per HPI.  Past Medical History  Diagnosis Date  . Sleep apnea   . MVP (mitral valve prolapse)     reports eval with cardiologist remotely  . Hypertension   . Thyroid disease   . Irregular heart beat     palpitations, Dr. Lovell Sheehan had her take metoprolol - reports eval with cardiologist remotely  . MIGRAINE HEADACHE 07/21/2007    Qualifier: Diagnosis of  By: Lovell Sheehan MD, Balinda Quails   . DERMATITIS 04/04/2010    Qualifier: Diagnosis of  By: Gabriel Rung LPN, Harriett Sine    . OSA (obstructive sleep apnea)     reports mild and resolved with weight loss    Past Surgical History  Procedure Laterality Date  . Appendectomy    . Melanoma excision      patient denies this - reports error as was her father who had melanoma, sees dermatologist yearly for skin check  . Breast lumpectomy      benign    Family History  Problem Relation Age of Onset  . Melanoma Father   . Cancer Father     melanoma  . Stroke Mother   . Stroke Maternal Grandfather     History   Social History  . Marital Status: Married    Spouse Name: N/A  . Number of Children: N/A  . Years of Education: N/A   Occupational History  . [employer]   Social History Main Topics  . Smoking status: Never Smoker   . Smokeless tobacco: Not on file  . Alcohol Use: No  . Drug Use: No  . Sexual Activity: Yes   Other Topics Concern  . None   Social History Narrative   Work or School: retired - used to work for [employer] Situation: lives with husband      Spiritual Beliefs: Christian      Lifestyle: no regular exercise; diet is healthy           Current outpatient prescriptions:  .  Calcium Carbonate-Vitamin D  (CALTRATE 600+D) 600-400 MG-UNIT per tablet, Take 1 tablet by mouth 2 (two) times daily.  , Disp: , Rfl:  .  diazepam (VALIUM) 5 MG tablet, TAKE ONE TABLET BY MOUTH EVERY 6 HOURS AS NEEDED FOR DIZZINESS OR HEART PALPITATIONS, Disp: 30 tablet, Rfl: 3 .  fluticasone (FLONASE) 50 MCG/ACT nasal spray, 2 sprays by Nasal route daily.  , Disp: , Rfl:  .  levothyroxine (SYNTHROID, LEVOTHROID) 75 MCG tablet, TAKE ONE TABLET BY MOUTH EVERY DAY, Disp: 90 tablet, Rfl: 1 .  metoprolol succinate (TOPROL-XL) 100 MG 24 hr tablet, Take 1 tablet (100 mg total) by mouth daily. Take with or immediately following a meal., Disp: 90 tablet, Rfl: 3 .  omeprazole (PRILOSEC) 40 MG capsule, Take 1 capsule (40 mg total) by mouth daily., Disp: 30 capsule, Rfl: 6  EXAM:  Filed Vitals:   03/11/15 0800  BP: 130/84  Pulse: 69  Temp: 97.8 F (36.6 C)    Body mass index is 23.12 kg/(m^2).  GENERAL: vitals reviewed and listed above, alert, oriented, appears well hydrated and in no acute distress  HEENT: atraumatic, conjunttiva clear, no obvious abnormalities on inspection of external  nose and ears  BREASTS: normal appearance of both breasts other then small healed scar L upper outer breast; area of increased breast density and TTP in medial lower quadrant of breast and along inferior aspect of L breast that is asymetric when compared to the other breast - this doe not seem to be firm or fixed but more rubbery and mobile. Dry skin both breasts.  CV: HRRR, no peripheral edema  MS: moves all extremities without noticeable abnormality  PSYCH: pleasant and cooperative, no obvious depression or anxiety  ASSESSMENT AND PLAN:  Discussed the following assessment and plan:  Breast pain, left - Plan: MM Digital Diagnostic Unilat L, US BREAST LTD UNI LEFT INC AXILLA  Breast lump on left side at 7 o'clock position - Plan: MM Digital Diagnostic Unilat L, US BREAST LTD UNI LEFT INC AXILLA  -query breast cyst or dry skin as  cause of irritation -l breast diag mammo and [place] to exclude other - discussed etiologies of breast pain and density -Patient advised to return or notify a doctor immediately if symptoms worsen or persist or new concerns arise.  Patient Instructions  -We placed a referral for you as discussed for imaging of the Left breast. It usually takes about 1-2 weeks to process and schedule this referral. If you have not heard from us regarding this appointment in 2 weeks please contact our office.      Kriste BasqueKIM, HANNAH R.

## 2015-03-26 ENCOUNTER — Encounter: Payer: Self-pay | Admitting: Family Medicine

## 2015-03-30 ENCOUNTER — Ambulatory Visit
Admission: RE | Admit: 2015-03-30 | Discharge: 2015-03-30 | Disposition: A | Discharge status: Home / Self Care | Payer: BC Managed Care – PPO | Source: Ambulatory Visit | Attending: Sports Medicine | Admitting: Sports Medicine

## 2015-03-30 DIAGNOSIS — M25512 Pain in left shoulder: Secondary | ICD-10-CM

## 2015-05-17 ENCOUNTER — Ambulatory Visit: Payer: BC Managed Care – PPO | Admitting: Family Medicine

## 2015-05-17 ENCOUNTER — Encounter: Payer: BC Managed Care – PPO | Admitting: Family Medicine

## 2015-06-03 ENCOUNTER — Other Ambulatory Visit: Payer: Self-pay | Admitting: Family Medicine

## 2015-06-07 ENCOUNTER — Ambulatory Visit (INDEPENDENT_AMBULATORY_CARE_PROVIDER_SITE_OTHER): Payer: No Typology Code available for payment source | Admitting: Family Medicine

## 2015-06-07 ENCOUNTER — Encounter: Payer: Self-pay | Admitting: Family Medicine

## 2015-06-07 VITALS — BP 124/84 | HR 63 | Temp 98.1°F | Ht 66.5 in | Wt 151.2 lb

## 2015-06-07 DIAGNOSIS — E039 Hypothyroidism, unspecified: Secondary | ICD-10-CM | POA: Diagnosis not present

## 2015-06-07 DIAGNOSIS — E875 Hyperkalemia: Secondary | ICD-10-CM

## 2015-06-07 DIAGNOSIS — N644 Mastodynia: Secondary | ICD-10-CM

## 2015-06-07 DIAGNOSIS — I341 Nonrheumatic mitral (valve) prolapse: Secondary | ICD-10-CM

## 2015-06-07 DIAGNOSIS — Z0000 Encounter for general adult medical examination without abnormal findings: Secondary | ICD-10-CM

## 2015-06-07 DIAGNOSIS — K219 Gastro-esophageal reflux disease without esophagitis: Secondary | ICD-10-CM

## 2015-06-07 DIAGNOSIS — R002 Palpitations: Secondary | ICD-10-CM

## 2015-06-07 LAB — BASIC METABOLIC PANEL
BUN: 14 mg/dL (ref 6–23)
CO2: 25 mEq/L (ref 19–32)
Calcium: 10 mg/dL (ref 8.4–10.5)
Chloride: 106 mEq/L (ref 96–112)
Creatinine, Ser: 0.84 mg/dL (ref 0.40–1.20)
GFR: 72.6 mL/min (ref >60.00)
GLUCOSE: 97 mg/dL (ref 70–99)
POTASSIUM: 5.5 meq/L — AB (ref 3.5–5.1)
Sodium: 142 mEq/L (ref 135–145)

## 2015-06-07 LAB — LIPID PANEL
Cholesterol: 191 mg/dL (ref 0–200)
HDL: 49.6 mg/dL (ref >39.00)
LDL CALC: 114 mg/dL — AB (ref 0–99)
NonHDL: 141.4
Total CHOL/HDL Ratio: 4
Triglycerides: 136 mg/dL (ref 0.0–149.0)
VLDL: 27.2 mg/dL (ref 0.0–40.0)

## 2015-06-07 LAB — TSH: TSH: 1.34 u[IU]/mL (ref 0.35–4.50)

## 2015-06-07 LAB — HM MAMMOGRAPHY

## 2015-06-07 NOTE — Patient Instructions (Signed)
BEFORE YOU LEAVE: -labs -schedule follow up in 6 months  -We placed a referral for you as discussed for the diagnostic mammogram and the heart study. It usually takes about 1-2 weeks to process and schedule this referral. If you have not heard from us regarding this appointment in 2 weeks please contact our office.  We recommend the following healthy lifestyle measures: - eat a healthy diet consisting of lots of vegetables, fruits, beans, nuts, seeds, healthy meats such as white chicken and fish and whole grains.  - avoid fried foods, fast food, processed foods, sodas, red meet and other fattening foods.  - get a least 150 minutes of aerobic exercise per week.   -We have ordered labs or studies at this visit. It can take up to 1-2 weeks for results and processing. We will contact you with instructions IF your results are abnormal. Normal results will be released to your [facility]. If you have not heard from us or can not find your results in [facility] in 2 weeks please contact our office.  -PLEASE SIGN UP FOR MYCHART TODAY   We recommend the following healthy lifestyle measures: - eat a healthy diet consisting of lots of vegetables, fruits, beans, nuts, seeds, healthy meats such as white chicken and fish and whole grains.  - avoid fried foods, fast food, processed foods, sodas, red meet and other fattening foods.  - get a least 150 minutes of aerobic exercise per week.

## 2015-06-07 NOTE — Progress Notes (Signed)
Pre visit review using our clinic review tool, if applicable. No additional management support is needed unless otherwise documented below in the visit note. 

## 2015-06-07 NOTE — Addendum Note (Signed)
Addended by: Johnella MoloneyFUNDERBURK, JO A on: 06/07/2015 02:46 PM   Modules accepted: Orders

## 2015-06-07 NOTE — Progress Notes (Signed)
HPI:  Here for CPE:  -Concerns and/or follow up today:  Hypothyroid: -chronic -meds: levothyroxine 75 -denies: no changes in weight, no hot/cold intol, skin changes  Palpitations/HTN/MVP: -meds: metoprolol  -denies: CP, SOB, DOE -mild SOB occassionally, mild discomfort in chest at night sometimes that she does not think is GERD  GERD: -meds: omeprazole -stable -denies reflux, dysphagia  Breast pain: -referred for diagnostic mammo and [place] -reports: resolved, did not hear about the mammogram for [facility]  -Diet: variety of foods, balance and well rounded, larger portion sizes  -Exercise: no regular exercise  -Taking folic acid, vitamin D or calcium: no  -Diabetes and Dyslipidemia Screening: FASTING  -Vaccines: UTD  -pap history: done last year and normal  -FDLMP: n/a  -wants STI testing (Hep C if born [birth year]): no  -FH breast, colon or ovarian ca: see FH Last mammogram: 09/2014 Last colon cancer screening:  Done  See FH for breast ca risks, getting annual mammograms  Sees Dermatology annual for skin exam   -Alcohol, Tobacco, drug use: see social history  Review of Systems - no fevers, unintentional weight loss, vision loss, hearing loss, chest pain, sob, hemoptysis, melena, hematochezia, hematuria, genital discharge, changing or concerning skin lesions, bleeding, bruising, loc, thoughts of self harm or SI  Past Medical History  Diagnosis Date  . Sleep apnea   . MVP (mitral valve prolapse)     reports eval with cardiologist remotely  . Hypertension   . Thyroid disease   . Irregular heart beat     palpitations, Dr. Lovell Sheehan had her take metoprolol - reports eval with cardiologist remotely  . MIGRAINE HEADACHE 07/21/2007    Qualifier: Diagnosis of  By: Lovell Sheehan MD, Balinda Quails   . DERMATITIS 04/04/2010    Qualifier: Diagnosis of  By: Gabriel Rung LPN, Harriett Sine    . OSA (obstructive sleep apnea)     reports mild and resolved with weight loss    Past Surgical History   Procedure Laterality Date  . Appendectomy    . Melanoma excision      patient denies this - reports error as was her father who had melanoma, sees dermatologist yearly for skin check  . Breast lumpectomy      benign    Family History  Problem Relation Age of Onset  . Melanoma Father   . Cancer Father     melanoma  . Stroke Mother   . Stroke Maternal Grandfather     History   Social History  . Marital Status: Married    Spouse Name: N/A  . Number of Children: N/A  . Years of Education: N/A   Occupational History  . [employer]   Social History Main Topics  . Smoking status: Never Smoker   . Smokeless tobacco: Not on file  . Alcohol Use: No  . Drug Use: No  . Sexual Activity: Yes   Other Topics Concern  . None   Social History Narrative   Work or School: retired - used to work for [employer] Situation: lives with husband      Spiritual Beliefs: Christian      Lifestyle: no regular exercise; diet is healthy           Current outpatient prescriptions:  .  Calcium Carbonate-Vitamin D (CALTRATE 600+D) 600-400 MG-UNIT per tablet, Take 1 tablet by mouth 2 (two) times daily.  , Disp: , Rfl:  .  diazepam (VALIUM) 5 MG tablet, TAKE ONE TABLET BY MOUTH  EVERY 6 HOURS AS NEEDED FOR DIZZINESS OR HEART PALPITATIONS, Disp: 30 tablet, Rfl: 3 .  fluticasone (FLONASE) 50 MCG/ACT nasal spray, 2 sprays by Nasal route daily.  , Disp: , Rfl:  .  levothyroxine (SYNTHROID, LEVOTHROID) 75 MCG tablet, TAKE ONE TABLET BY MOUTH ONCE DAILY, Disp: 90 tablet, Rfl: 0 .  metoprolol succinate (TOPROL-XL) 100 MG 24 hr tablet, Take 1 tablet (100 mg total) by mouth daily. Take with or immediately following a meal., Disp: 90 tablet, Rfl: 3 .  omeprazole (PRILOSEC) 40 MG capsule, Take 1 capsule (40 mg total) by mouth daily., Disp: 30 capsule, Rfl: 6  EXAM:  Filed Vitals:   06/07/15 0806  BP: 124/84  Pulse: 63  Temp: 98.1 F (36.7 C)    GENERAL: vitals reviewed and  listed below, alert, oriented, appears well hydrated and in no acute distress  HEENT: head atraumatic, PERRLA, normal appearance of eyes, ears, nose and mouth. moist mucus membranes.  NECK: supple, no masses or lymphadenopathy  LUNGS: clear to auscultation bilaterally, no rales, rhonchi or wheeze  CV: HRRR, no peripheral edema or cyanosis, normal pedal pulses  BREAST: normal appearance - no lesions or discharge, on palpation similar findings as last visit with some asymetric density of breast tissue L inferomedial outer breast with some TTP in inferolateral L breast - mobile, rubbery  ABDOMEN: bowel sounds normal, soft, non tender to palpation, no masses, no rebound or guarding  GU:  declined  RECTAL: refused  SKIN: no rash or abnormal lesions  MS: normal gait, moves all extremities normally  NEURO: CN II-XII grossly intact, normal muscle strength and sensation to light touch on extremities  PSYCH: normal affect, pleasant and cooperative  ASSESSMENT AND PLAN:  Discussed the following assessment and plan:  Visit for preventive health examination  Hypothyroidism, unspecified hypothyroidism type -labs  Palpitations - Plan: ECHOCARDIOGRAM COMPLETE MVP (mitral valve prolapse) - Plan: ECHOCARDIOGRAM COMPLETE -repeat echo given her concerns, she wants re-eval as reports has been a long time since last echo  Gastroesophageal reflux disease without esophagitis -stable  Breast pain -resolved, but still with some TTP and asymetric likely fibrocystic findings on exam -checked on mammo and US information and [facility] reports they receive order and contacted pt but pt did not respond.Referral coordinator set up exam for today.  -Discussed and advised all [place] preventive services health task force level A and B recommendations for age, sex and risks.  -Advised at least 150 minutes of exercise per week and a healthy diet low in saturated fats and sweets and consisting of fresh fruits and  vegetables, lean meats such as fish and white chicken and whole grains.  -FASTING labs, studies and vaccines per orders this encounter  Orders Placed This Encounter  Procedures  . Lipid Panel  . Basic metabolic panel  . TSH  . ECHOCARDIOGRAM COMPLETE    Standing Status: Future     Number of Occurrences:      Standing Expiration Date: 09/06/2016    Order Specific Question:  Where should this test be performed    [facility]([address]    Order Specific Question:  Complete or Limited study?    Answer:  Complete    Order Specific Question:  With Image Enhancing Agent or without Image Enhancing Agent?    Answer:  With Image Enhancing Agent    Order Specific Question:  Reason for exam-Echo    Answer:  Mitral Valve Disorder  424.0 / I05.9  Patient advised to return to clinic immediately if symptoms worsen or persist or new concerns.  Patient Instructions  BEFORE YOU LEAVE: -labs -schedule follow up in 6 months  -We placed a referral for you as discussed for the diagnostic mammogram and the heart study. It usually takes about 1-2 weeks to process and schedule this referral. If you have not heard from [place] regarding this appointment in 2 weeks please contact our office.  We recommend the following healthy lifestyle measures: - eat a healthy diet consisting of lots of vegetables, fruits, beans, nuts, seeds, healthy meats such as white chicken and fish and whole grains.  - avoid fried foods, fast food, processed foods, sodas, red meet and other fattening foods.  - get a least 150 minutes of aerobic exercise per week.   -We have ordered labs or studies at this visit. It can take up to 1-2 weeks for results and processing. We will contact you with instructions IF your results are abnormal. Normal results will be released to your [facility] If you have not heard from [place] or can not find your results in [facility] in 2 weeks please contact our office.  -PLEASE SIGN UP FOR  MYCHART TODAY   We recommend the following healthy lifestyle measures: - eat a healthy diet consisting of lots of vegetables, fruits, beans, nuts, seeds, healthy meats such as white chicken and fish and whole grains.  - avoid fried foods, fast food, processed foods, sodas, red meet and other fattening foods.  - get a least 150 minutes of aerobic exercise per week.       No Follow-up on file.  Kriste Basque R.

## 2015-06-11 ENCOUNTER — Encounter: Payer: Self-pay | Admitting: Family Medicine

## 2015-06-13 ENCOUNTER — Ambulatory Visit (HOSPITAL_COMMUNITY): Payer: No Typology Code available for payment source | Attending: Cardiovascular Disease

## 2015-06-13 ENCOUNTER — Other Ambulatory Visit: Payer: Self-pay

## 2015-06-13 DIAGNOSIS — I10 Essential (primary) hypertension: Secondary | ICD-10-CM | POA: Diagnosis not present

## 2015-06-13 DIAGNOSIS — R002 Palpitations: Secondary | ICD-10-CM | POA: Diagnosis present

## 2015-06-13 DIAGNOSIS — I341 Nonrheumatic mitral (valve) prolapse: Secondary | ICD-10-CM

## 2015-06-21 ENCOUNTER — Encounter: Payer: Self-pay | Admitting: Family Medicine

## 2015-06-26 ENCOUNTER — Other Ambulatory Visit (INDEPENDENT_AMBULATORY_CARE_PROVIDER_SITE_OTHER): Payer: No Typology Code available for payment source

## 2015-06-26 DIAGNOSIS — E875 Hyperkalemia: Secondary | ICD-10-CM | POA: Diagnosis not present

## 2015-06-26 LAB — BASIC METABOLIC PANEL
BUN: 13 mg/dL (ref 6–23)
CALCIUM: 9.5 mg/dL (ref 8.4–10.5)
CHLORIDE: 109 meq/L (ref 96–112)
CO2: 29 mEq/L (ref 19–32)
CREATININE: 0.77 mg/dL (ref 0.40–1.20)
GFR: 80.26 mL/min (ref >60.00)
Glucose, Bld: 87 mg/dL (ref 70–99)
POTASSIUM: 5.1 meq/L (ref 3.5–5.1)
SODIUM: 144 meq/L (ref 135–145)

## 2015-09-03 ENCOUNTER — Other Ambulatory Visit: Payer: Self-pay | Admitting: Family Medicine

## 2015-09-30 LAB — HM MAMMOGRAPHY: HM MAMMO: NEGATIVE

## 2015-10-03 ENCOUNTER — Encounter: Payer: Self-pay | Admitting: Family Medicine

## 2015-10-16 ENCOUNTER — Telehealth: Payer: Self-pay

## 2015-10-16 ENCOUNTER — Other Ambulatory Visit: Payer: Self-pay

## 2015-10-16 NOTE — Telephone Encounter (Signed)
Refill request for Valium 5mg  #30.

## 2015-10-17 NOTE — Telephone Encounter (Signed)
I called the and she stated she was given this medication by Dr Lovell SheehanJenkins due to palpitations and the instructions on the bottle state to take this every 6 hours for dizziness or heart palpitations.  States Dr Selena BattenKim had mentioned she may give her a short prescription if needed.

## 2015-10-17 NOTE — Telephone Encounter (Signed)
LM for pt to return call. Please help set up appt. Thanks.

## 2015-10-17 NOTE — Telephone Encounter (Signed)
From review of notes, it appears she took this for an ear issue that she sees Dr. Jac CanavanKrauss for. This is not a medication I would typically advise or rx for this. Would advise refills from treating doctor if persistent need or appointment with me if she prefers to discuss treatment options. Thanks.

## 2015-10-17 NOTE — Telephone Encounter (Signed)
I spoke with the pt and scheduled her for an appt on 11/16.

## 2015-10-23 ENCOUNTER — Ambulatory Visit (INDEPENDENT_AMBULATORY_CARE_PROVIDER_SITE_OTHER): Payer: BC Managed Care – PPO | Admitting: Family Medicine

## 2015-10-23 ENCOUNTER — Encounter: Payer: Self-pay | Admitting: Family Medicine

## 2015-10-23 VITALS — BP 122/80 | HR 69 | Temp 98.2°F | Ht 66.5 in | Wt 148.9 lb

## 2015-10-23 DIAGNOSIS — F410 Panic disorder [episodic paroxysmal anxiety] without agoraphobia: Secondary | ICD-10-CM | POA: Diagnosis not present

## 2015-10-23 DIAGNOSIS — R079 Chest pain, unspecified: Secondary | ICD-10-CM

## 2015-10-23 DIAGNOSIS — E039 Hypothyroidism, unspecified: Secondary | ICD-10-CM

## 2015-10-23 DIAGNOSIS — R002 Palpitations: Secondary | ICD-10-CM

## 2015-10-23 LAB — BASIC METABOLIC PANEL
BUN: 12 mg/dL (ref 6–23)
CO2: 29 meq/L (ref 19–32)
Calcium: 9.4 mg/dL (ref 8.4–10.5)
Chloride: 108 mEq/L (ref 96–112)
Creatinine, Ser: 0.8 mg/dL (ref 0.40–1.20)
GFR: 76.72 mL/min (ref >60.00)
GLUCOSE: 86 mg/dL (ref 70–99)
POTASSIUM: 5.3 meq/L — AB (ref 3.5–5.1)
SODIUM: 143 meq/L (ref 135–145)

## 2015-10-23 LAB — TSH: TSH: 1.51 u[IU]/mL (ref 0.35–4.50)

## 2015-10-23 MED ORDER — LORAZEPAM 0.5 MG PO TABS: ORAL_TABLET | ORAL | Status: DC

## 2015-10-23 NOTE — Patient Instructions (Addendum)
BEFORE YOU LEAVE: -labs -EKG -follow up in 3 months  -We placed a referral for you as discussed. It usually takes about 1-2 weeks to process and schedule this referral. If you have not heard from us regarding this appointment in 2 weeks please contact our office.  -hold off on aerobic exercise until evaluated by the cardiologist  -seek emergency care immediately if worsening chest pain, pain that does not resolve promptly, trouble breathing or other concerns regarding your heart

## 2015-10-23 NOTE — Progress Notes (Signed)
HPI:  Hypothyroid: -chronic -meds: levothyroxine 75 -denies: no changes in weight, no hot/cold intol, skin changes  Palpitations/HTN: -meds: metoprolol, reports panic sometimes when gets palpitations and uses valium from prior PCP rarely for this -reports for the last 6 months has noticed "burning", substernal CP only with activity that resolves with rest, in the past she reported this occurred at night only and was felt was likely GERD -denies: DOE, jaw pain, arm pain, SOB -she has palpitations that are triggered by stress, more this past week due to stress -she had a normal echocardiogram last year despite a reported PMH MVP  GERD: -meds: omeprazole -stable -denies reflux, dysphagia  Meniere's Disease: -reports stable -sees Dr. Jac CanavanKrauss for this  ROS: See pertinent positives and negatives per HPI.  Past Medical History  Diagnosis Date  . Sleep apnea   . MVP (mitral valve prolapse)     reports eval with cardiologist remotely  . Hypertension   . Thyroid disease   . Irregular heart beat     palpitations, Dr. Lovell SheehanJenkins had her take metoprolol - reports eval with cardiologist remotely  . MIGRAINE HEADACHE 07/21/2007    Qualifier: Diagnosis of  By: Lovell SheehanJenkins MD, Balinda QuailsJohn E   . DERMATITIS 04/04/2010    Qualifier: Diagnosis of  By: Gabriel RungNeckers LPN, Harriett SineNancy    . OSA (obstructive sleep apnea)     reports mild and resolved with weight loss    Past Surgical History  Procedure Laterality Date  . Appendectomy    . Melanoma excision      patient denies this - reports error as was her father who had melanoma, sees dermatologist yearly for skin check  . Breast lumpectomy      benign    Family History  Problem Relation Age of Onset  . Melanoma Father   . Cancer Father     melanoma  . Stroke Mother   . Stroke Maternal Grandfather     Social History   Social History  . Marital Status: Married    Spouse Name: N/A  . Number of Children: N/A  . Years of Education: N/A   Occupational  History  . [employer]   Social History Main Topics  . Smoking status: Never Smoker   . Smokeless tobacco: None  . Alcohol Use: No  . Drug Use: No  . Sexual Activity: Yes   Other Topics Concern  . 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  Calcium Carbonate-Vitamin D (CALTRATE 600+D) 600-400 MG-UNIT per tablet, Take 1 tablet by mouth 2 (two) times daily.  , Disp: , Rfl:  .  diazepam (VALIUM) 5 MG tablet, TAKE ONE TABLET BY MOUTH EVERY 6 HOURS AS NEEDED FOR DIZZINESS OR HEART PALPITATIONS, Disp: 30 tablet, Rfl: 3 .  fluticasone (FLONASE) 50 MCG/ACT nasal spray, 2 sprays by Nasal route daily.  , Disp: , Rfl:  .  levothyroxine (SYNTHROID, LEVOTHROID) 75 MCG tablet, TAKE ONE TABLET BY MOUTH ONCE DAILY, Disp: 90 tablet, Rfl: 0 .  metoprolol succinate (TOPROL-XL) 100 MG 24 hr tablet, Take 1 tablet (100 mg total) by mouth daily. Take with or immediately following a meal., Disp: 90 tablet, Rfl: 3 .  omeprazole (PRILOSEC) 40 MG capsule, Take 1 capsule (40 mg total) by mouth daily., Disp: 30 capsule, Rfl: 6 .  LORazepam (ATIVAN) 0.5 MG tablet, Use 1/2 to 1 tablet as needed for panic attacks/anxiety, Disp: 30 tablet, Rfl: 0  EXAM:  Filed Vitals:   10/23/15 0846  BP: 122/80  Pulse: 69  Temp: 98.2 F (36.8 C)    Body mass index is 23.68 kg/(m^2).  GENERAL: vitals reviewed and listed above, alert, oriented, appears well hydrated and in no acute distress  HEENT: atraumatic, conjunttiva clear, no obvious abnormalities on inspection of external nose and ears  NECK: no obvious masses on inspection  LUNGS: clear to auscultation bilaterally, no wheezes, rales or rhonchi, good air movement  CV: HRRR, no peripheral edema  MS: moves all extremities  without noticeable abnormality  PSYCH: pleasant and cooperative, no obvious depression or anxiety  ASSESSMENT AND PLAN:  Discussed the following assessment and plan:  Chest pain, unspecified chest pain type - Plan: EKG 12-Lead, Ambulatory referral to Cardiology  Palpitations - Plan: Ambulatory referral to Cardiology, TSH, Basic metabolic panel  Hypothyroidism, unspecified hypothyroidism type - Plan: TSH  Panic disorder - Plan: LORazepam (ATIVAN) 0.5 MG tablet  -EKG with NSR and no PVCs/PAcs or changes -low dose prn benzo for panic, she uses very rarely, risks discussed at length -cards eval for persistent palpitations and CP, this may be GERD given chronic nature and burning character, but given is with activity, feel eval with cardiology warrented - referral place, emergency precautions -check TSH and BMP -follow up as planned -Patient advised to return or notify a doctor immediately if symptoms worsen or persist or new concerns arise.  Patient Instructions  BEFORE YOU LEAVE: -labs -EKG -follow up in 3 months  -We placed a referral for you as discussed. It usually takes about 1-2 weeks to process and schedule this referral. If you have not heard from [place] regarding this appointment in 2 weeks please contact our office.  -hold off on aerobic exercise until evaluated by the cardiologist  -seek emergency care immediately if worsening chest pain, pain that does not resolve promptly, trouble breathing or other concerns regarding your heart     Man Effertz R.

## 2015-10-23 NOTE — Progress Notes (Signed)
Pre visit review using our clinic review tool, if applicable. No additional management support is needed unless otherwise documented below in the visit note. 

## 2015-11-15 ENCOUNTER — Encounter: Payer: Self-pay | Admitting: Cardiology

## 2015-11-15 ENCOUNTER — Ambulatory Visit (INDEPENDENT_AMBULATORY_CARE_PROVIDER_SITE_OTHER): Payer: BC Managed Care – PPO | Admitting: Cardiology

## 2015-11-15 VITALS — BP 132/78 | HR 64 | Ht 68.0 in | Wt 149.0 lb

## 2015-11-15 DIAGNOSIS — I208 Other forms of angina pectoris: Secondary | ICD-10-CM

## 2015-11-15 DIAGNOSIS — I10 Essential (primary) hypertension: Secondary | ICD-10-CM | POA: Diagnosis not present

## 2015-11-15 DIAGNOSIS — R002 Palpitations: Secondary | ICD-10-CM | POA: Diagnosis not present

## 2015-11-15 DIAGNOSIS — I493 Ventricular premature depolarization: Secondary | ICD-10-CM

## 2015-11-15 DIAGNOSIS — Z8679 Personal history of other diseases of the circulatory system: Secondary | ICD-10-CM

## 2015-11-15 NOTE — Progress Notes (Signed)
Cardiology Office Note  Date: 11/15/2015   ID: Tracey FischerCatherine M Klontz, DOB 07-12-1951, MRN [id number]  PCP: Terressa KoyanagiKIM, HANNAH R., DO  Consulting Cardiologist: Nona DellSamuel Thales Knipple, MD   Chief Complaint  Patient presents with  . Chest Pain   History of Present Illness: Tracey Giles is a 64 y.o. female referred for cardiology consultation by Dr. Selena BattenKim. She reports a history of exertional chest burning over the last month. This is also been associated with more frequent palpitations. Symptoms are moderate and resolve with rest after a few minutes. She has not had any dizziness or syncope. She does not endorse any change in her medications. She also states that she has reflux symptoms in the evenings, particularly if she does not take her PPI, but that these symptoms are different from her exertional chest burning. She does not have any known history of obstructive CAD or myocardial infarction.  She has a reported history of mitral valve prolapse based on remote assessment, however her most recent echocardiogram in July 2016 reported a normal mitral valve without significant mitral regurgitation.  We reviewed her medications which are outlined below. She has been on Toprol XL with history of palpitations. Previous cardiac monitor in April 2011 showed sinus rhythm with some early morning sinus bradycardia, and rare PVCs. No sustained arrhythmias.  She has been retired for the last few years, previously an Research scientist (medical)executive assistant at [employer]. She does not have any history of premature CAD in her first-degree relatives.  Past Medical History  Diagnosis Date  . Sleep apnea     Reportedly mild  . MVP (mitral valve prolapse)     Reported history  . Essential hypertension   . Hypothyroidism   . Palpitations     Rare PVCs by Holter monitor April 2011   . History of migraine headaches   . History of dermatitis     Past Surgical History  Procedure Laterality Date  . Appendectomy    . Breast lumpectomy       Benign    Current Outpatient Prescriptions  Medication Sig Dispense Refill  . Calcium Carbonate-Vitamin D (CALTRATE 600+D) 600-400 MG-UNIT per tablet Take 1 tablet by mouth 2 (two) times daily.      . fluticasone (FLONASE) 50 MCG/ACT nasal spray 2 sprays by Nasal route daily.      Marland Kitchen. levothyroxine (SYNTHROID, LEVOTHROID) 75 MCG tablet TAKE ONE TABLET BY MOUTH ONCE DAILY 90 tablet 0  . LORazepam (ATIVAN) 0.5 MG tablet Use 1/2 to 1 tablet as needed for panic attacks/anxiety 30 tablet 0  . metoprolol succinate (TOPROL-XL) 100 MG 24 hr tablet Take 1 tablet (100 mg total) by mouth daily. Take with or immediately following a meal. 90 tablet 3  . omeprazole (PRILOSEC) 40 MG capsule Take 1 capsule (40 mg total) by mouth daily. 30 capsule 6   No current facility-administered medications for this visit.   Allergies:  Cephalexin; Penicillins; and Sulfamethoxazole-trimethoprim   Social History: The patient  reports that she has never smoked. She does not have any smokeless tobacco history on file. She reports that she does not drink alcohol or use illicit drugs.   Family History: The patient's family history includes Melanoma in her father; Stroke in her maternal grandfather and mother.   ROS:  Please see the history of present illness. Otherwise, complete review of systems is positive for intermittent reflux symptoms.  All other systems are reviewed and negative.   Physical Exam: VS:  BP 132/78 mmHg  Pulse 64  Ht  (1.727 m)  Wt 149 lb (67.586 kg)  BMI 22.66 kg/m2  SpO2 99%, BMI Body mass index is 22.66 kg/(m^2).  Wt Readings from Last 3 Encounters:  11/15/15 149 lb (67.586 kg)  10/23/15 148 lb 14.4 oz (67.541 kg)  06/07/15 151 lb 3.2 oz (68.584 kg)    General: Patient appears comfortable at rest. HEENT: Conjunctiva and lids normal, oropharynx clear with moist mucosa. Neck: Supple, no elevated JVP or carotid bruits, no thyromegaly. Lungs: Clear to auscultation, nonlabored  breathing at rest. Cardiac: Regular rate and rhythm, no S3 or significant systolic murmur, no pericardial rub. Abdomen: Soft, nontender, no hepatomegaly, bowel sounds present, no guarding or rebound. Extremities: No pitting edema, distal pulses 2+. Skin: Warm and dry. Musculoskeletal: No kyphosis. Neuropsychiatric: Alert and oriented x3, affect grossly appropriate.  ECG: Tracing from 10/23/2015 showed normal sinus rhythm.  Recent Labwork: 10/23/2015: BUN 12; Creatinine, Ser 0.80; Potassium 5.3*; Sodium 143; TSH 1.51     Component Value Date/Time   CHOL 191 06/07/2015 0849   TRIG 136.0 06/07/2015 0849   HDL 49.60 06/07/2015 0849   CHOLHDL 4 06/07/2015 0849   VLDL 27.2 06/07/2015 0849   LDLCALC 114* 06/07/2015 0849    Other Studies Reviewed Today:  Echocardiogram 06/13/2015: Study Conclusions  - Left ventricle: The cavity size was normal. Systolic function was normal. The estimated ejection fraction was in the range of 55% to 60%. Wall motion was normal; there were no regional wall motion abnormalities. Left ventricular diastolic function parameters were normal.  Assessment and Plan:  1. Exertional chest burning suggestive of possible angina based on description. Cardiac risk factors include hypertension, LDL was 114 back in July. She does not have any family history of premature CAD. Reported history of mitral valve prolapse, although not clearly defined by her last echocardiogram. She does have palpitations and previous documentation of PVCs which has been managed with beta blocker therapy. We will plan further objective testing to assess for ischemia via exercise echocardiogram. I will have her wean off of Toprol-XL a few days before the study so that adequate heart rate can be achieved.  2. Essential hypertension, on Toprol-XL for concurrent management of palpitations. Blood pressure control is adequate today.  3. Reported history of mitral valve prolapse, although not  clearly defined by most recent echocardiogram. She does not have a systolic click or significant systolic murmur at this time.  4. History of palpitations and previous documentation of PVCs with normal LVEF. She may have some exacerbation temporarily as we briefly wean her off Toprol-XL for stress testing. Anticipate she will go back on her prior dose on the day that the stress test is completed.  Current medicines were reviewed with the patient today.   Orders Placed This Encounter  Procedures  . Echo stress    Disposition: Call with results.   Signed, Jonelle Sidle, MD, [facility] 11/15/2015 8:52 AM    [facility] at [facility] [address], [place], [place] [address] Phone: [phone]; Fax: [phone]

## 2015-11-15 NOTE — Patient Instructions (Addendum)
  Your physician recommends that you schedule a follow-up appointment in:  To be determined after test     Your physician has requested that you have a stress echocardiogram. For further information please visit [email or web address]. Please follow instruction sheet as given.  Thre days before test we need to taper your Toprol XL. On the 3 rd day before your test - take 50 mg, On the second before your test - take 50 mg, On the day before your test AND the day of HOLD your Toprol XL    Thank you for choosing  [facility] !

## 2015-11-20 ENCOUNTER — Ambulatory Visit (HOSPITAL_COMMUNITY)
Admission: RE | Admit: 2015-11-20 | Discharge: 2015-11-20 | Disposition: A | Discharge status: Home / Self Care | Payer: BC Managed Care – PPO | Source: Ambulatory Visit | Attending: Cardiology | Admitting: Cardiology

## 2015-11-20 ENCOUNTER — Encounter (HOSPITAL_COMMUNITY): Payer: Self-pay

## 2015-11-20 DIAGNOSIS — I208 Other forms of angina pectoris: Secondary | ICD-10-CM | POA: Diagnosis present

## 2015-11-20 LAB — ECHOCARDIOGRAM STRESS TEST
CHL CUP MPHR: 156 {beats}/min
CSEPEDS: 33 s
CSEPEW: 5.4 METS
Exercise duration (min): 3 min
Peak HR: 164 {beats}/min
Percent HR: 105 %
RPE: 13
Rest HR: 83 {beats}/min

## 2015-12-02 ENCOUNTER — Other Ambulatory Visit: Payer: Self-pay | Admitting: Family Medicine

## 2015-12-04 ENCOUNTER — Other Ambulatory Visit: Payer: Self-pay | Admitting: Family Medicine

## 2015-12-10 ENCOUNTER — Ambulatory Visit: Payer: No Typology Code available for payment source | Admitting: Family Medicine

## 2015-12-11 ENCOUNTER — Ambulatory Visit: Payer: No Typology Code available for payment source | Admitting: Family Medicine

## 2016-01-23 ENCOUNTER — Encounter: Payer: Self-pay | Admitting: Family Medicine

## 2016-01-23 ENCOUNTER — Ambulatory Visit (INDEPENDENT_AMBULATORY_CARE_PROVIDER_SITE_OTHER): Payer: BC Managed Care – PPO | Admitting: Family Medicine

## 2016-01-23 VITALS — BP 118/80 | HR 95 | Temp 98.8°F | Ht 68.0 in | Wt 151.0 lb

## 2016-01-23 DIAGNOSIS — J069 Acute upper respiratory infection, unspecified: Secondary | ICD-10-CM

## 2016-01-23 MED ORDER — HYDROCODONE-HOMATROPINE 5-1.5 MG/5ML PO SYRP: 5.0000 mL | ORAL_SOLUTION | Freq: Three times a day (TID) | ORAL | Status: DC | PRN

## 2016-01-23 NOTE — Progress Notes (Signed)
HPI:  Acute visit for:  URI: -started 4 days ago -Symptoms: Nasal congestion, postnasal drip, cough, sinus pain and pressure -Denies fevers, shortness of breath, tooth pain, nausea, vomiting, diarrhea  Chronic medical problems:  Hypothyroid: -chronic -meds: levothyroxine 75 -denies: no changes in weight, no hot/cold intol, skin changes  Palpitations/HTN: -meds: metoprolol, reports panic sometimes when gets palpitations and uses valium from prior PCP rarely for this -she had evaluation with cardiologist for the CP she reported at her last visit and had a negative stress echo -denies: DOE, jaw pain, arm pain, SOB -she has palpitations that are triggered by stress, more this past week due to stress -she had a normal echocardiogram in 2016 and has no murmur despite a reported PMH MVP  GERD: -meds: omeprazole -stable -denies reflux, dysphagia  Meniere's Disease: -reports stable -sees Dr. Jac Canavan for this  ROS: See pertinent positives and negatives per HPI.  Past Medical History  Diagnosis Date  . Sleep apnea     Reportedly mild  . MVP (mitral valve prolapse)     Reported history  . Essential hypertension   . Hypothyroidism   . Palpitations     Rare PVCs by Holter monitor April 2011   . History of migraine headaches   . History of dermatitis     Past Surgical History  Procedure Laterality Date  . Appendectomy    . Breast lumpectomy      Benign    Family History  Problem Relation Age of Onset  . Melanoma Father   . Stroke Mother   . Stroke Maternal Grandfather     Social History   Social History  . Marital Status: Married    Spouse Name: N/A  . Number of Children: N/A  . Years of Education: N/A   Occupational History  . [employer]   Social History Main Topics  . Smoking status: Never Smoker   . Smokeless tobacco: None  . Alcohol Use: No  . Drug Use: No  . Sexual Activity: Yes   Other Topics Concern  . None   Social History Narrative   Work  or School: retired - used to work for [employer] Situation: lives with husband      Spiritual Beliefs: Christian      Lifestyle: no regular exercise; diet is healthy           Current outpatient prescriptions:  .  Calcium Carbonate-Vitamin D (CALTRATE 600+D) 600-400 MG-UNIT per tablet, Take 1 tablet by mouth 2 (two) times daily.  , Disp: , Rfl:  .  fluticasone (FLONASE) 50 MCG/ACT nasal spray, 2 sprays by Nasal route daily.  , Disp: , Rfl:  .  levothyroxine (SYNTHROID, LEVOTHROID) 75 MCG tablet, TAKE ONE TABLET BY MOUTH ONCE DAILY, Disp: 90 tablet, Rfl: 0 .  LORazepam (ATIVAN) 0.5 MG tablet, Use 1/2 to 1 tablet as needed for panic attacks/anxiety, Disp: 30 tablet, Rfl: 0 .  metoprolol succinate (TOPROL-XL) 100 MG 24 hr tablet, TAKE ONE TABLET BY MOUTH ONCE DAILY --  **TAKE  WITH  OR  IMMEDIATELY  FOLLOWING  A  MEAL**, Disp: 90 tablet, Rfl: 0 .  omeprazole (PRILOSEC) 40 MG capsule, Take 1 capsule (40 mg total) by mouth daily., Disp: 30 capsule, Rfl: 6 .  HYDROcodone-homatropine (HYCODAN) 5-1.5 MG/5ML syrup, Take 5 mLs by mouth every 8 (eight) hours as needed for cough., Disp: 120 mL, Rfl: 0  EXAM:  Filed Vitals:   01/23/16 0925  BP:  118/80  Pulse: 95  Temp: 98.8 F (37.1 C)    Body mass index is 22.96 kg/(m^2).  GENERAL: vitals reviewed and listed above, alert, oriented, appears well hydrated and in no acute distress  HEENT: atraumatic, conjunttiva clear, no obvious abnormalities on inspection of external nose and ears, normal appearance of ear canals and TMs, clear nasal congestion, mild post oropharyngeal erythema with PND, no tonsillar edema or exudate, no sinus TTP   NECK: no obvious masses on inspection  LUNGS: clear to auscultation bilaterally, no wheezes, rales or rhonchi, good air movement  CV: HRRR, no peripheral edema  MS: moves all extremities without noticeable abnormality  PSYCH: pleasant and cooperative, no obvious depression or  anxiety  ASSESSMENT AND PLAN:  Discussed the following assessment and plan:  Acute upper respiratory infection - Plan: HYDROcodone-homatropine (HYCODAN) 5-1.5 MG/5ML syrup  -Symptoms and exam consistent with a likely viral upper respiratory infection, discussed supportive care measures, treatment, side effects, typical course and signs and symptoms of a serious infection or illness and reasons to follow up -Plan lab work for chronic conditions follow-up in 3-4 months -Patient advised to return or notify a doctor immediately if symptoms worsen or persist or new concerns arise.  Patient Instructions  Before you leave: -Schedule follow up in 3-4 months, please come fasting and we will plan to do lab work that day  INSTRUCTIONS FOR UPPER RESPIRATORY INFECTION:  -plenty of rest and fluids  -nasal saline wash 2-3 times daily (use prepackaged nasal saline or bottled/distilled water if making your own)   -can use AFRIN nasal spray for drainage and nasal congestion - but do NOT use longer then 3-4 days  -can use tylenol (in no history of liver disease) or ibuprofen (if no history of kidney disease, bowel bleeding or significant heart disease) as directed for aches and sorethroat  -in the winter time, using a humidifier at night is helpful (please follow cleaning instructions)  -if you are taking a cough medication - use only as directed, may also try a teaspoon of honey to coat the throat and throat lozenges. If given a cough medication with codeine or hydrocodone or other narcotic please be advised that this contains a strong and  potentially addicting medication. Please follow instructions carefully, take as little as possible and only use AS NEEDED for severe cough. Discuss potential side effects with your pharmacy. Please do not drive or operate machinery while taking these types of medications. Please do not take other sedating medications, drugs or alcohol while taking this medication  without discussing with your doctor.  -for sore throat, salt water gargles can help  -follow up if you have fevers, facial pain, tooth pain, difficulty breathing or are worsening or symptoms persist longer then expected  Upper Respiratory Infection, Adult An upper respiratory infection (URI) is also known as the common cold. It is often caused by a type of germ (virus). Colds are easily spread (contagious). You can pass it to others by kissing, coughing, sneezing, or drinking out of the same glass. Usually, you get better in 1 to 3  weeks.  However, the cough can last for even longer. HOME CARE   Only take medicine as told by your doctor. Follow instructions provided above.  Drink enough water and fluids to keep your pee (urine) clear or pale yellow.  Get plenty of rest.  Return to work when your temperature is < 100 for 24 hours or as told by your doctor. You may use a face  mask and wash your hands to stop your cold from spreading. GET HELP RIGHT AWAY IF:   After the first few days, you feel you are getting worse.  You have questions about your medicine.  You have chills, shortness of breath, or red spit (mucus).  You have pain in the face for more then 1-2 days, especially when you bend forward.  You have a fever, puffy (swollen) neck, pain when you swallow, or white spots in the back of your throat.  You have a bad headache, ear pain, sinus pain, or chest pain.  You have a high-pitched whistling sound when you breathe in and out (wheezing).  You cough up blood.  You have sore muscles or a stiff neck. MAKE SURE YOU:   Understand these instructions.  Will watch your condition.  Will get help right away if you are not doing well or get worse. Document Released: 05/11/2008 Document Revised: 02/15/2012 Document Reviewed: 02/28/2014 [facility] Patient Information 2015 [place], [place]. This information is not intended to replace advice given to you by your health care provider.  Make sure you discuss any questions you have with your health care provider.      Kriste Basque R.

## 2016-01-23 NOTE — Patient Instructions (Signed)
Before you leave: -Schedule follow up in 3-4 months, please come fasting and we will plan to do lab work that day  INSTRUCTIONS FOR UPPER RESPIRATORY INFECTION:  -plenty of rest and fluids  -nasal saline wash 2-3 times daily (use prepackaged nasal saline or bottled/distilled water if making your own)   -can use AFRIN nasal spray for drainage and nasal congestion - but do NOT use longer then 3-4 days  -can use tylenol (in no history of liver disease) or ibuprofen (if no history of kidney disease, bowel bleeding or significant heart disease) as directed for aches and sorethroat  -in the winter time, using a humidifier at night is helpful (please follow cleaning instructions)  -if you are taking a cough medication - use only as directed, may also try a teaspoon of honey to coat the throat and throat lozenges. If given a cough medication with codeine or hydrocodone or other narcotic please be advised that this contains a strong and  potentially addicting medication. Please follow instructions carefully, take as little as possible and only use AS NEEDED for severe cough. Discuss potential side effects with your pharmacy. Please do not drive or operate machinery while taking these types of medications. Please do not take other sedating medications, drugs or alcohol while taking this medication without discussing with your doctor.  -for sore throat, salt water gargles can help  -follow up if you have fevers, facial pain, tooth pain, difficulty breathing or are worsening or symptoms persist longer then expected  Upper Respiratory Infection, Adult An upper respiratory infection (URI) is also known as the common cold. It is often caused by a type of germ (virus). Colds are easily spread (contagious). You can pass it to others by kissing, coughing, sneezing, or drinking out of the same glass. Usually, you get better in 1 to 3  weeks.  However, the cough can last for even longer. HOME CARE   Only take  medicine as told by your doctor. Follow instructions provided above.  Drink enough water and fluids to keep your pee (urine) clear or pale yellow.  Get plenty of rest.  Return to work when your temperature is < 100 for 24 hours or as told by your doctor. You may use a face mask and wash your hands to stop your cold from spreading. GET HELP RIGHT AWAY IF:   After the first few days, you feel you are getting worse.  You have questions about your medicine.  You have chills, shortness of breath, or red spit (mucus).  You have pain in the face for more then 1-2 days, especially when you bend forward.  You have a fever, puffy (swollen) neck, pain when you swallow, or white spots in the back of your throat.  You have a bad headache, ear pain, sinus pain, or chest pain.  You have a high-pitched whistling sound when you breathe in and out (wheezing).  You cough up blood.  You have sore muscles or a stiff neck. MAKE SURE YOU:   Understand these instructions.  Will watch your condition.  Will get help right away if you are not doing well or get worse. Document Released: 05/11/2008 Document Revised: 02/15/2012 Document Reviewed: 02/28/2014 [facility] Patient Information 2015 [place], [place]. This information is not intended to replace advice given to you by your health care provider. Make sure you discuss any questions you have with your health care provider.

## 2016-01-23 NOTE — Progress Notes (Signed)
Pre visit review using our clinic review tool, if applicable. No additional management support is needed unless otherwise documented below in the visit note. 

## 2016-02-25 ENCOUNTER — Other Ambulatory Visit: Payer: Self-pay | Admitting: Family Medicine

## 2016-04-30 ENCOUNTER — Ambulatory Visit (INDEPENDENT_AMBULATORY_CARE_PROVIDER_SITE_OTHER): Payer: BC Managed Care – PPO | Admitting: Family Medicine

## 2016-04-30 ENCOUNTER — Telehealth: Payer: Self-pay | Admitting: Family Medicine

## 2016-04-30 ENCOUNTER — Encounter: Payer: Self-pay | Admitting: Family Medicine

## 2016-04-30 VITALS — BP 128/92 | HR 77 | Temp 98.4°F | Ht 68.0 in | Wt 148.6 lb

## 2016-04-30 DIAGNOSIS — B029 Zoster without complications: Secondary | ICD-10-CM | POA: Diagnosis not present

## 2016-04-30 MED ORDER — VALACYCLOVIR HCL 1 G PO TABS: 1000.0000 mg | ORAL_TABLET | Freq: Three times a day (TID) | ORAL | Status: DC

## 2016-04-30 NOTE — Progress Notes (Addendum)
HPI:  Acute visit for  Rash: -started yesterday -symptoms: pain and vesicular rash R frontal region and pain and redness R eye -denies: vision changes or loss, fevers, vomiting, lesions elsewhere, exposure to poison ivy -She has opthomologist at [facility] opthomology -Had shingles vaccine in 2013  ROS: See pertinent positives and negatives per HPI.  Past Medical History  Diagnosis Date  . Sleep apnea     Reportedly mild  . MVP (mitral valve prolapse)     Reported history  . Essential hypertension   . Hypothyroidism   . Palpitations     Rare PVCs by Holter monitor April 2011   . History of migraine headaches   . History of dermatitis     Past Surgical History  Procedure Laterality Date  . Appendectomy    . Breast lumpectomy      Benign    Family History  Problem Relation Age of Onset  . Melanoma Father   . Stroke Mother   . Stroke Maternal Grandfather     Social History   Social History  . Marital Status: Married    Spouse Name: N/A  . Number of Children: N/A  . Years of Education: N/A   Occupational History  . [employer]   Social History Main Topics  . Smoking status: Never Smoker   . Smokeless tobacco: None  . Alcohol Use: No  . Drug Use: No  . Sexual Activity: Yes   Other Topics Concern  . None   Social History Narrative   Work or School: retired - used to work for [employer] Situation: lives with husband      Spiritual Beliefs: Christian      Lifestyle: no regular exercise; diet is healthy           Current outpatient prescriptions:  .  Calcium Carbonate-Vitamin D (CALTRATE 600+D) 600-400 MG-UNIT per tablet, Take 1 tablet by mouth 2 (two) times daily.  , Disp: , Rfl:  .  fluticasone (FLONASE) 50 MCG/ACT nasal spray, 2 sprays by Nasal route daily.  , Disp: , Rfl:  .  levothyroxine (SYNTHROID, LEVOTHROID) 75 MCG tablet, TAKE ONE TABLET BY MOUTH ONCE DAILY, Disp: 90 tablet, Rfl: 0 .  LORazepam (ATIVAN) 0.5 MG tablet,  Use 1/2 to 1 tablet as needed for panic attacks/anxiety, Disp: 30 tablet, Rfl: 0 .  metoprolol succinate (TOPROL-XL) 100 MG 24 hr tablet, TAKE ONE TABLET BY MOUTH ONCE DAILY **TAKE WITH OR IMMEDIATELY FOLLOWING A MEAL**, Disp: 90 tablet, Rfl: 0 .  omeprazole (PRILOSEC) 40 MG capsule, Take 1 capsule (40 mg total) by mouth daily., Disp: 30 capsule, Rfl: 6 .  valACYclovir (VALTREX) 1000 MG tablet, Take 1 tablet (1,000 mg total) by mouth 3 (three) times daily., Disp: 21 tablet, Rfl: 0  EXAM:  Filed Vitals:   04/30/16 1031  BP: 128/92  Pulse: 77  Temp: 98.4 F (36.9 C)    Body mass index is 22.6 kg/(m^2).  GENERAL: vitals reviewed and listed above, alert, oriented, appears well hydrated and in no acute distress  HEENT: atraumatic, see skin exam, erythema of conjunctiva with mild tearing on the R, visual acuity grossly intact, PERRLA, EOMI, no obvious abnormalities on inspection of external nose and ears except skin findings, no oropharyngeal or nasal lesions  NECK: no obvious masses on inspection  SKIN: erythematous papulovesicular rash on R forehead  MS: moves all extremities without noticeable abnormality  PSYCH: pleasant and cooperative, no obvious depression or anxiety  ASSESSMENT AND PLAN:  Discussed the following assessment and plan:  Shingles  -start valtrex -discussed implications and seriousness of eye involvement -assistant to notify her opthomologist and request urgent exam and may need eye drops as well -Patient advised to return or notify a doctor immediately if symptoms worsen or persist or new concerns arise.  Patient Instructions  BEFORE YOU LEAVE: -opthalmology (eye doctor) appointment details -follow up: 1 month  START the Valtrex right away  Follow up with your opthomologist     Shingles Shingles, which is also known as herpes zoster, is an infection that causes a painful skin rash and fluid-filled blisters. Shingles is not related to genital herpes,  which is a sexually transmitted infection.   Shingles only develops in people who:  Have had chickenpox.  Have received the chickenpox vaccine. (This is rare.) CAUSES Shingles is caused by varicella-zoster virus (VZV). This is the same virus that causes chickenpox. After exposure to VZV, the virus stays in the body in an inactive (dormant) state. Shingles develops if the virus reactivates. This can happen many years after the initial exposure to VZV. It is not known what causes this virus to reactivate. RISK FACTORS People who have had chickenpox or received the chickenpox vaccine are at risk for shingles. Infection is more common in people who:  Are older than age 57.  Have a weakened defense (immune) system, such as those with HIV, AIDS, or cancer.  Are taking medicines that weaken the immune system, such as transplant medicines.  Are under great stress. SYMPTOMS Early symptoms of this condition include itching, tingling, and pain in an area on your skin. Pain may be described as burning, stabbing, or throbbing. A few days or weeks after symptoms start, a painful red rash appears, usually on one side of the body in a bandlike or beltlike pattern. The rash eventually turns into fluid-filled blisters that break open, scab over, and dry up in about 2-3 weeks. At any time during the infection, you may also develop:  A fever.  Chills.  A headache.  An upset stomach. DIAGNOSIS This condition is diagnosed with a skin exam. Sometimes, skin or fluid samples are taken from the blisters before a diagnosis is made. These samples are examined under a microscope or sent to a lab for testing. TREATMENT There is no specific cure for this condition. Your health care provider will probably prescribe medicines to help you manage pain, recover more quickly, and avoid long-term problems. Medicines may include:  Antiviral drugs.  Anti-inflammatory drugs.  Pain medicines. If the area involved is  on your face, you may be referred to a specialist, such as an eye doctor (ophthalmologist) or an ear, nose, and throat (ENT) doctor to help you avoid eye problems, chronic pain, or disability. HOME CARE INSTRUCTIONS Medicines  Take medicines only as directed by your health care provider.  Apply an anti-itch or numbing cream to the affected area as directed by your health care provider. Blister and Rash Care  Take a cool bath or apply cool compresses to the area of the rash or blisters as directed by your health care provider. This may help with pain and itching.  Keep your rash covered with a loose bandage (dressing). Wear loose-fitting clothing to help ease the pain of material rubbing against the rash.  Keep your rash and blisters clean with mild soap and cool water or as directed by your health care provider.  Check your rash every day for signs of infection.  These include redness, swelling, and pain that lasts or increases.  Do not pick your blisters.  Do not scratch your rash. General Instructions  Rest as directed by your health care provider.  Keep all follow-up visits as directed by your health care provider. This is important.  Until your blisters scab over, your infection can cause chickenpox in people who have never had it or been vaccinated against it. To prevent this from happening, avoid contact with other people, especially:  Babies.  Pregnant women.  Children who have eczema.  Elderly people who have transplants.  People who have chronic illnesses, such as leukemia or AIDS. SEEK MEDICAL CARE IF:  Your pain is not relieved with prescribed medicines.  Your pain does not get better after the rash heals.  Your rash looks infected. Signs of infection include redness, swelling, and pain that lasts or increases. SEEK IMMEDIATE MEDICAL CARE IF:  The rash is on your face or nose.  You have facial pain, pain around your eye area, or loss of feeling on one side of  your face.  You have ear pain or you have ringing in your ear.  You have loss of taste.  Your condition gets worse.   This information is not intended to replace advice given to you by your health care provider. Make sure you discuss any questions you have with your health care provider.   Document Released: 11/23/2005 Document Revised: 12/14/2014 Document Reviewed: 10/04/2014 Elsevier Interactive Patient Education [address]2016 [employer]      [place]IM, Tiki GardensHANNAH R.

## 2016-04-30 NOTE — Telephone Encounter (Signed)
Patient was seen today by Dr.Kim.

## 2016-04-30 NOTE — Telephone Encounter (Signed)
Patient Name: Tracey RedwoodCATHERINE MUTAREL LI DOB: 05-16-1951 Initial Comment Caller thinks she has shingles, in her eye, feels like someone stabbing it Nurse Assessment Nurse: Yetta BarreJones, RN, Miranda Date/Time (Eastern Time): 04/30/2016 8:19:23 AM Confirm and document reason for call. If symptomatic, describe symptoms. You must click the next button to save text entered. ---Caller states she woke up a couple of days ago with her right eye red and painful. The pain was shooting up her eye to her forehead. Yesterday she woke up with blister rash on the right side of her forehead. The pain in her eye is getting worse. Has the patient traveled out of the country within the last 30 days? ---Not Applicable Does the patient have any new or worsening symptoms? ---Yes Will a triage be completed? ---Yes Related visit to physician within the last 2 weeks? ---No Does the PT have any chronic conditions? (i.e. diabetes, asthma, etc.) ---Yes List chronic conditions. ---Mitral valve prolapse, Thyroid, Meniere's disease Is this a behavioral health or substance abuse call? ---No Guidelines Guideline Title Affirmed Question Affirmed Notes Shingles [1] Shingles rash of face AND [2] eye pain or blurred vision Final Disposition User See Physician within 4 Hours (or PCP triage) Yetta BarreJones, RN, Miranda Comments Appt scheduled with Dr. Selena BattenKim for today at 10:30am Referrals REFERRED TO PCP OFFICE Disagree/Comply: Comply

## 2016-04-30 NOTE — Telephone Encounter (Signed)
FYI

## 2016-04-30 NOTE — Progress Notes (Signed)
Pre visit review using our clinic review tool, if applicable. No additional management support is needed unless otherwise documented below in the visit note. 

## 2016-04-30 NOTE — Patient Instructions (Addendum)
BEFORE YOU LEAVE: -opthalmology (eye doctor) appointment details -follow up: 1 month  START the Valtrex right away  Follow up with your opthomologist     Shingles Shingles, which is also known as herpes zoster, is an infection that causes a painful skin rash and fluid-filled blisters. Shingles is not related to genital herpes, which is a sexually transmitted infection.   Shingles only develops in people who:  Have had chickenpox.  Have received the chickenpox vaccine. (This is rare.) CAUSES Shingles is caused by varicella-zoster virus (VZV). This is the same virus that causes chickenpox. After exposure to VZV, the virus stays in the body in an inactive (dormant) state. Shingles develops if the virus reactivates. This can happen many years after the initial exposure to VZV. It is not known what causes this virus to reactivate. RISK FACTORS People who have had chickenpox or received the chickenpox vaccine are at risk for shingles. Infection is more common in people who:  Are older than age 65.  Have a weakened defense (immune) system, such as those with HIV, AIDS, or cancer.  Are taking medicines that weaken the immune system, such as transplant medicines.  Are under great stress. SYMPTOMS Early symptoms of this condition include itching, tingling, and pain in an area on your skin. Pain may be described as burning, stabbing, or throbbing. A few days or weeks after symptoms start, a painful red rash appears, usually on one side of the body in a bandlike or beltlike pattern. The rash eventually turns into fluid-filled blisters that break open, scab over, and dry up in about 2-3 weeks. At any time during the infection, you may also develop:  A fever.  Chills.  A headache.  An upset stomach. DIAGNOSIS This condition is diagnosed with a skin exam. Sometimes, skin or fluid samples are taken from the blisters before a diagnosis is made. These samples are examined under a microscope  or sent to a lab for testing. TREATMENT There is no specific cure for this condition. Your health care provider will probably prescribe medicines to help you manage pain, recover more quickly, and avoid long-term problems. Medicines may include:  Antiviral drugs.  Anti-inflammatory drugs.  Pain medicines. If the area involved is on your face, you may be referred to a specialist, such as an eye doctor (ophthalmologist) or an ear, nose, and throat (ENT) doctor to help you avoid eye problems, chronic pain, or disability. HOME CARE INSTRUCTIONS Medicines  Take medicines only as directed by your health care provider.  Apply an anti-itch or numbing cream to the affected area as directed by your health care provider. Blister and Rash Care  Take a cool bath or apply cool compresses to the area of the rash or blisters as directed by your health care provider. This may help with pain and itching.  Keep your rash covered with a loose bandage (dressing). Wear loose-fitting clothing to help ease the pain of material rubbing against the rash.  Keep your rash and blisters clean with mild soap and cool water or as directed by your health care provider.  Check your rash every day for signs of infection. These include redness, swelling, and pain that lasts or increases.  Do not pick your blisters.  Do not scratch your rash. General Instructions  Rest as directed by your health care provider.  Keep all follow-up visits as directed by your health care provider. This is important.  Until your blisters scab over, your infection can cause chickenpox in people  who have never had it or been vaccinated against it. To prevent this from happening, avoid contact with other people, especially:  Babies.  Pregnant women.  Children who have eczema.  Elderly people who have transplants.  People who have chronic illnesses, such as leukemia or AIDS. SEEK MEDICAL CARE IF:  Your pain is not relieved with  prescribed medicines.  Your pain does not get better after the rash heals.  Your rash looks infected. Signs of infection include redness, swelling, and pain that lasts or increases. SEEK IMMEDIATE MEDICAL CARE IF:  The rash is on your face or nose.  You have facial pain, pain around your eye area, or loss of feeling on one side of your face.  You have ear pain or you have ringing in your ear.  You have loss of taste.  Your condition gets worse.   This information is not intended to replace advice given to you by your health care provider. Make sure you discuss any questions you have with your health care provider.   Document Released: 11/23/2005 Document Revised: 12/14/2014 Document Reviewed: 10/04/2014 Elsevier Interactive Patient Education [employer]

## 2016-05-19 ENCOUNTER — Telehealth: Payer: Self-pay | Admitting: *Deleted

## 2016-05-19 NOTE — Telephone Encounter (Signed)
Please let pt know I received a message to ensure she had done the repeat in 2016. I can now see the report from repeat evaluation with [facility] in 09/2015 and it advises repeat mammogram in 1 year. That would be October. Please thank her for letting us know she had done this.

## 2016-05-19 NOTE — Telephone Encounter (Signed)
I called the pt and she stated she has had this done already?  States this was done in July 2016 and [facility] and they do not have record of this.  I called [facility] and they will be faxing a copy of the results from July 2016 and stated there are no results on this pt done in 03/2015. Results placed on Dr Elmyra RicksKim's desk.

## 2016-05-19 NOTE — Telephone Encounter (Signed)
I called the pt and informed her of the message below

## 2016-05-19 NOTE — Telephone Encounter (Signed)
-----   Message from Terressa KoyanagiHannah R Kim, DO sent at 05/18/2016  1:15 PM EDT ----- Received a note that an order for a ultrasound of the breast is expired. Continue check to see if she has done her repeat breast exam, ultrasound? Please help her to reorder and schedule if needed. ----- Message -----    From: SYSTEM    Sent: 05/15/2016  12:05 AM      To: Terressa KoyanagiHannah R Kim, DO

## 2016-05-22 ENCOUNTER — Ambulatory Visit (INDEPENDENT_AMBULATORY_CARE_PROVIDER_SITE_OTHER): Payer: BC Managed Care – PPO | Admitting: Family Medicine

## 2016-05-22 ENCOUNTER — Encounter: Payer: Self-pay | Admitting: Family Medicine

## 2016-05-22 VITALS — BP 124/84 | HR 62 | Temp 97.8°F | Ht 68.0 in | Wt 148.2 lb

## 2016-05-22 DIAGNOSIS — E785 Hyperlipidemia, unspecified: Secondary | ICD-10-CM

## 2016-05-22 DIAGNOSIS — R002 Palpitations: Secondary | ICD-10-CM

## 2016-05-22 DIAGNOSIS — E039 Hypothyroidism, unspecified: Secondary | ICD-10-CM | POA: Diagnosis not present

## 2016-05-22 DIAGNOSIS — I10 Essential (primary) hypertension: Secondary | ICD-10-CM

## 2016-05-22 DIAGNOSIS — R5382 Chronic fatigue, unspecified: Secondary | ICD-10-CM

## 2016-05-22 DIAGNOSIS — H8102 Meniere's disease, left ear: Secondary | ICD-10-CM

## 2016-05-22 DIAGNOSIS — R0600 Dyspnea, unspecified: Secondary | ICD-10-CM

## 2016-05-22 DIAGNOSIS — R0789 Other chest pain: Secondary | ICD-10-CM

## 2016-05-22 DIAGNOSIS — K219 Gastro-esophageal reflux disease without esophagitis: Secondary | ICD-10-CM | POA: Diagnosis not present

## 2016-05-22 LAB — CBC
HEMATOCRIT: 41.8 % (ref 36.0–46.0)
Hemoglobin: 13.9 g/dL (ref 12.0–15.0)
MCHC: 33.3 g/dL (ref 30.0–36.0)
MCV: 90.6 fl (ref 78.0–100.0)
Platelets: 294 10*3/uL (ref 150.0–400.0)
RBC: 4.61 Mil/uL (ref 3.87–5.11)
RDW: 14 % (ref 11.5–15.5)
WBC: 6.3 10*3/uL (ref 4.0–10.5)

## 2016-05-22 LAB — LIPID PANEL
CHOL/HDL RATIO: 4
Cholesterol: 216 mg/dL — ABNORMAL HIGH (ref 0–200)
HDL: 55.6 mg/dL (ref >39.00)
LDL CALC: 134 mg/dL — AB (ref 0–99)
NonHDL: 160.12
TRIGLYCERIDES: 132 mg/dL (ref 0.0–149.0)
VLDL: 26.4 mg/dL (ref 0.0–40.0)

## 2016-05-22 LAB — BASIC METABOLIC PANEL
BUN: 13 mg/dL (ref 6–23)
CHLORIDE: 106 meq/L (ref 96–112)
CO2: 27 meq/L (ref 19–32)
CREATININE: 0.78 mg/dL (ref 0.40–1.20)
Calcium: 9.5 mg/dL (ref 8.4–10.5)
GFR: 78.85 mL/min (ref >60.00)
Glucose, Bld: 92 mg/dL (ref 70–99)
POTASSIUM: 5.2 meq/L — AB (ref 3.5–5.1)
Sodium: 140 mEq/L (ref 135–145)

## 2016-05-22 LAB — TSH: TSH: 2.52 u[IU]/mL (ref 0.35–4.50)

## 2016-05-22 LAB — VITAMIN D 25 HYDROXY (VIT D DEFICIENCY, FRACTURES): VITD: 18.7 ng/mL — AB (ref 30.00–100.00)

## 2016-05-22 NOTE — Progress Notes (Signed)
Pre visit review using our clinic review tool, if applicable. No additional management support is needed unless otherwise documented below in the visit note. 

## 2016-05-22 NOTE — Progress Notes (Signed)
HPI:  Shingles: -doing much better -pain is gone -saw her opthomologist for this - did antiviral here and steroids in eye from optho per her report  Hypothyroid: -chronic -meds: levothyroxine 75 -denies: no changes in weight, no hot/cold intol, skin changes  Palpitations/HTN: -meds: metoprolol, -denies: DOE, jaw pain, arm pain, SOB -she has palpitations that are triggered by stress, more this past week due to stress - prior PCP advised benzo occ for panic attack from palpitations -she had a normal echocardiogram despite a reported PMH MVP -she has had negative stress echo for chest burning with exercise -she wonders if she could have asthma - FH of this -she has felt tired for a few months - had very bad tooth infection with weeks of abx and root canal recently and shingles recently -other then the tooth pain and shingles pain, no other symptoms  GERD: -meds: omeprazole -stable -denies reflux, dysphagia  Meniere's Disease: -reports stable -sees Dr. Jac CanavanKrauss for this  ROS: See pertinent positives and negatives per HPI.  Past Medical History  Diagnosis Date  . Sleep apnea     Reportedly mild  . MVP (mitral valve prolapse)     Reported history  . Essential hypertension   . Hypothyroidism   . Palpitations     Rare PVCs by Holter monitor April 2011   . History of migraine headaches   . History of dermatitis     Past Surgical History  Procedure Laterality Date  . Appendectomy    . Breast lumpectomy      Benign    Family History  Problem Relation Age of Onset  . Melanoma Father   . Stroke Mother   . Stroke Maternal Grandfather     Social History   Social History  . Marital Status: Married    Spouse Name: N/A  . Number of Children: N/A  . Years of Education: N/A   Occupational History  . [employer]   Social History Main Topics  . Smoking status: Never Smoker   . Smokeless tobacco: None  . Alcohol Use: No  . Drug Use: No  . Sexual Activity: Yes    Other Topics Concern  . 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  Calcium Carbonate-Vitamin D (CALTRATE 600+D) 600-400 MG-UNIT per tablet, Take 1 tablet by mouth 2 (two) times daily.  , Disp: , Rfl:  .  fluticasone (FLONASE) 50 MCG/ACT nasal spray, 2 sprays by Nasal route daily.  , Disp: , Rfl:  .  levothyroxine (SYNTHROID, LEVOTHROID) 75 MCG tablet, TAKE ONE TABLET BY MOUTH ONCE DAILY, Disp: 90 tablet, Rfl: 0 .  LORazepam (ATIVAN) 0.5 MG tablet, Use 1/2 to 1 tablet as needed for panic attacks/anxiety, Disp: 30 tablet, Rfl: 0 .  metoprolol succinate (TOPROL-XL) 100 MG 24 hr tablet, TAKE ONE TABLET BY MOUTH ONCE DAILY **TAKE WITH OR IMMEDIATELY FOLLOWING A MEAL**, Disp: 90 tablet, Rfl: 0 .  omeprazole (PRILOSEC) 40 MG capsule, Take 1 capsule (40 mg total) by mouth daily., Disp: 30 capsule, Rfl: 6  EXAM:  Filed Vitals:   05/22/16 0854  BP: 124/84  Pulse: 62  Temp: 97.8 F (36.6 C)  Body mass index is 22.54 kg/(m^2).  GENERAL: vitals reviewed and listed above, alert, oriented, appears well hydrated and in no acute distress  HEENT: atraumatic, conjunttiva clear, no obvious abnormalities on inspection of external nose and ears  NECK: no obvious masses on inspection  LUNGS: clear to auscultation bilaterally, no wheezes, rales or rhonchi, good air movement  CV: HRRR, no peripheral edema  MS: moves all extremities without noticeable abnormality  PSYCH: pleasant and cooperative, no obvious depression or anxiety  ASSESSMENT AND PLAN:  Discussed the following assessment and plan:  Hypothyroidism, unspecified hypothyroidism type - Plan: TSH  Palpitations  Essential hypertension - Plan: Basic metabolic panel  Gastroesophageal reflux  disease without esophagitis  Meniere's disease, left  Hyperlipidemia - Plan: Lipid Panel  Atypical chest pain - Plan: Pulmonary function test  Dyspnea - Plan: Pulmonary function test  Chronic fatigue - Plan: CBC (no diff), Vitamin D, 25-hydroxy  -labs - some added for feeling puny - though has good reason with recent terrible longstanding tooth infection and then shingles - advised labs may be abnormal from this and if so would repeat in 2-3 months -lifestyle recs -PFTs for her concern for asthma -likely some deconditioning, advised very gradual increased exercise -advised healthy diet, probiotic (align or culturelle) -follow up in 3 months -Patient advised to return or notify a doctor immediately if symptoms worsen or persist or new concerns arise.  Patient Instructions  BEFORE YOU LEAVE: -labs -follow up appointment in 3 months  We recommend the following healthy lifestyle measures: - eat a healthy whole foods diet consisting of regular small meals composed of vegetables, fruits, beans, nuts, seeds, healthy meats such as white chicken and fish and whole grains.  - avoid sweets, white starchy foods, fried foods, fast food, processed foods, sodas, red meet and other fattening foods.  -gradually work up to 150-300 minutes of aerobic exercise per week.   -We placed a referral for you as discussed for lung testing. It usually takes about 1-2 weeks to process and schedule this referral. If you have not heard from [place] regarding this appointment in 2 weeks please contact our office.       Kriste Basque R.

## 2016-05-22 NOTE — Patient Instructions (Addendum)
BEFORE YOU LEAVE: -labs -follow up appointment in 3 months  We recommend the following healthy lifestyle measures: - eat a healthy whole foods diet consisting of regular small meals composed of vegetables, fruits, beans, nuts, seeds, healthy meats such as white chicken and fish and whole grains.  - avoid sweets, white starchy foods, fried foods, fast food, processed foods, sodas, red meet and other fattening foods.  -gradually work up to 150-300 minutes of aerobic exercise per week.   -We placed a referral for you as discussed for lung testing. It usually takes about 1-2 weeks to process and schedule this referral. If you have not heard from us regarding this appointment in 2 weeks please contact our office.

## 2016-05-24 ENCOUNTER — Encounter: Payer: Self-pay | Admitting: Family Medicine

## 2016-05-24 DIAGNOSIS — E785 Hyperlipidemia, unspecified: Secondary | ICD-10-CM | POA: Insufficient documentation

## 2016-05-24 DIAGNOSIS — E559 Vitamin D deficiency, unspecified: Secondary | ICD-10-CM

## 2016-05-24 HISTORY — DX: Vitamin D deficiency, unspecified: E55.9

## 2016-05-24 HISTORY — DX: Hyperlipidemia, unspecified: E78.5

## 2016-05-28 ENCOUNTER — Ambulatory Visit (INDEPENDENT_AMBULATORY_CARE_PROVIDER_SITE_OTHER): Payer: BC Managed Care – PPO | Admitting: Internal Medicine

## 2016-05-28 DIAGNOSIS — R0789 Other chest pain: Secondary | ICD-10-CM

## 2016-05-28 DIAGNOSIS — R0600 Dyspnea, unspecified: Secondary | ICD-10-CM | POA: Diagnosis not present

## 2016-05-28 DIAGNOSIS — J449 Chronic obstructive pulmonary disease, unspecified: Secondary | ICD-10-CM

## 2016-05-28 LAB — PULMONARY FUNCTION TEST
DL/VA % PRED: 78 %
DL/VA: 4.12 ml/min/mmHg/L
DLCO COR % PRED: 57 %
DLCO COR: 17.16 ml/min/mmHg
DLCO unc % pred: 58 %
DLCO unc: 17.36 ml/min/mmHg
FEF 25-75 Post: 0.51 L/sec
FEF 25-75 Pre: 1.3 L/sec
FEF2575-%CHANGE-POST: -60 %
FEF2575-%PRED-POST: 21 %
FEF2575-%PRED-PRE: 54 %
FEV1-%CHANGE-POST: -19 %
FEV1-%Pred-Post: 56 %
FEV1-%Pred-Pre: 70 %
FEV1-Post: 1.6 L
FEV1-Pre: 1.98 L
FEV1FVC-%CHANGE-POST: -4 %
FEV1FVC-%Pred-Pre: 90 %
FEV6-%Change-Post: -22 %
FEV6-%Pred-Post: 62 %
FEV6-%Pred-Pre: 79 %
FEV6-PRE: 2.82 L
FEV6-Post: 2.19 L
FEV6FVC-%Change-Post: -3 %
FEV6FVC-%Pred-Post: 98 %
FEV6FVC-%Pred-Pre: 102 %
FVC-%Change-Post: -15 %
FVC-%PRED-POST: 65 %
FVC-%PRED-PRE: 77 %
FVC-POST: 2.41 L
FVC-Pre: 2.86 L
POST FEV1/FVC RATIO: 66 %
POST FEV6/FVC RATIO: 95 %
PRE FEV1/FVC RATIO: 69 %
Pre FEV6/FVC Ratio: 99 %
RV % pred: 102 %
RV: 2.35 L
TLC % pred: 92 %
TLC: 5.22 L

## 2016-05-28 NOTE — Progress Notes (Signed)
PFT performed today. 

## 2016-06-01 NOTE — Addendum Note (Signed)
Addended by: Johnella MoloneyFUNDERBURK, JO A on: 06/01/2016 01:31 PM   Modules accepted: Orders

## 2016-06-01 NOTE — Addendum Note (Signed)
Addended by: Johnella MoloneyFUNDERBURK, JO A on: 06/01/2016 01:22 PM   Modules accepted: Orders

## 2016-06-02 ENCOUNTER — Other Ambulatory Visit: Payer: Self-pay | Admitting: Family Medicine

## 2016-06-02 DIAGNOSIS — F410 Panic disorder [episodic paroxysmal anxiety] without agoraphobia: Secondary | ICD-10-CM

## 2016-06-03 ENCOUNTER — Encounter: Payer: Self-pay | Admitting: Family Medicine

## 2016-06-03 MED ORDER — LEVOTHYROXINE SODIUM 75 MCG PO TABS: 75.0000 ug | ORAL_TABLET | Freq: Every day | ORAL | Status: DC

## 2016-06-03 MED ORDER — LORAZEPAM 0.5 MG PO TABS: ORAL_TABLET | ORAL | Status: DC

## 2016-06-03 MED ORDER — METOPROLOL SUCCINATE ER 100 MG PO TB24: 100.0000 mg | ORAL_TABLET | Freq: Every day | ORAL | Status: DC

## 2016-06-03 NOTE — Addendum Note (Signed)
Addended by: Terressa KoyanagiKIM, HANNAH R on: 06/03/2016 09:54 AM   Modules accepted: Orders

## 2016-06-03 NOTE — Telephone Encounter (Signed)
Ok to refill all. #30 of the ativan. 90 days with 3 refills all others. Phone ativan. Thanks.

## 2016-06-04 NOTE — Telephone Encounter (Signed)
I called in the Rx for Ativan to [facility].

## 2016-07-01 ENCOUNTER — Ambulatory Visit (INDEPENDENT_AMBULATORY_CARE_PROVIDER_SITE_OTHER)
Admission: RE | Admit: 2016-07-01 | Discharge: 2016-07-01 | Disposition: A | Discharge status: Home / Self Care | Payer: BC Managed Care – PPO | Source: Ambulatory Visit | Attending: Internal Medicine | Admitting: Internal Medicine

## 2016-07-01 ENCOUNTER — Encounter: Payer: Self-pay | Admitting: Internal Medicine

## 2016-07-01 ENCOUNTER — Other Ambulatory Visit (INDEPENDENT_AMBULATORY_CARE_PROVIDER_SITE_OTHER): Payer: BC Managed Care – PPO

## 2016-07-01 ENCOUNTER — Ambulatory Visit (INDEPENDENT_AMBULATORY_CARE_PROVIDER_SITE_OTHER): Payer: BC Managed Care – PPO | Admitting: Internal Medicine

## 2016-07-01 VITALS — BP 132/86 | HR 75

## 2016-07-01 DIAGNOSIS — R058 Other specified cough: Secondary | ICD-10-CM

## 2016-07-01 DIAGNOSIS — R05 Cough: Secondary | ICD-10-CM

## 2016-07-01 DIAGNOSIS — R0600 Dyspnea, unspecified: Secondary | ICD-10-CM | POA: Insufficient documentation

## 2016-07-01 LAB — CBC WITH DIFFERENTIAL/PLATELET
BASOS PCT: 0.8 % (ref 0.0–3.0)
Basophils Absolute: 0.1 10*3/uL (ref 0.0–0.1)
EOS PCT: 4.5 % (ref 0.0–5.0)
Eosinophils Absolute: 0.4 10*3/uL (ref 0.0–0.7)
HCT: 40.2 % (ref 36.0–46.0)
HEMOGLOBIN: 13.3 g/dL (ref 12.0–15.0)
LYMPHS ABS: 1.7 10*3/uL (ref 0.7–4.0)
Lymphocytes Relative: 20.9 % (ref 12.0–46.0)
MCHC: 33.2 g/dL (ref 30.0–36.0)
MCV: 90.8 fl (ref 78.0–100.0)
MONO ABS: 0.6 10*3/uL (ref 0.1–1.0)
Monocytes Relative: 7.4 % (ref 3.0–12.0)
NEUTROS PCT: 66.4 % (ref 43.0–77.0)
Neutro Abs: 5.4 10*3/uL (ref 1.4–7.7)
Platelets: 300 10*3/uL (ref 150.0–400.0)
RBC: 4.42 Mil/uL (ref 3.87–5.11)
RDW: 13.6 % (ref 11.5–15.5)
WBC: 8.1 10*3/uL (ref 4.0–10.5)

## 2016-07-01 LAB — BRAIN NATRIURETIC PEPTIDE: PRO B NATRI PEPTIDE: 77 pg/mL (ref 0.0–100.0)

## 2016-07-01 MED ORDER — FAMOTIDINE 20 MG PO TABS: ORAL_TABLET | ORAL | 11 refills | Status: DC

## 2016-07-01 NOTE — Progress Notes (Signed)
Subjective:    Patient ID: Tracey Giles, female    DOB: [birth year],    MRN: [id number]  HPI  59 yowf never smoker with new sensation of drainge x 2015 no resp to [facility] then started short of sob fall 2016 > treadmill at [facility] Dec 2016 gradually worse since then so referred to pulmonary clinic 07/01/2016 by Dr Selena Batten with abn pfts done 6/22/17suggesting airflow obstruction but not with typical f/v contours   07/01/2016 1st [facility] office visit/ Evanie Buckle  On ppi daily/ toprol 100 mg q  Pm  Chief Complaint  Patient presents with  . Pulmonary Consult    Referred by Dr. Kriste Basque. Pt c/o CP "deep and burning and goes to my back" with exertion x 6 months. She also feels that she is unable to take a good, deep breath even if she is resting.   throat clearing all year round x 2 years  esp p stirring in am and immediately when supine occ yellow mucus but really minimal   Doe x  9 months assoc with brief unexplained paroxysms of Sob at rest does not happen every day but resolve in a few sec to min s rx and  every time [place] gets sob/ feels almost like choked at times and has h/o GERD with active HB on PPI  No obvious patterns /triggers re day to day or daytime variability or assoc excess/ purulent sputum or mucus plugs or hemoptysis or cp or chest tightness, subjective wheeze or overt sinus symptoms. No unusual exp hx or h/o childhood pna/ asthma or knowledge of premature birth.  Sleeping ok without nocturnal  or early am exacerbation  of respiratory  c/o's or need for noct saba. Also denies any obvious fluctuation of symptoms with weather or environmental changes or other aggravating or alleviating factors except as outlined above   Current Medications, Allergies, Complete Past Medical History, Past Surgical History, Family History, and Social History were reviewed in [facility] record.          Review of Systems  Constitutional: Negative for chills,  fever and unexpected weight change.  HENT: Positive for congestion, dental problem and sore throat. Negative for ear pain, nosebleeds, postnasal drip, rhinorrhea, sinus pressure, sneezing, trouble swallowing and voice change.   Eyes: Negative for visual disturbance.  Respiratory: Positive for cough and shortness of breath. Negative for choking.   Cardiovascular: Positive for chest pain. Negative for leg swelling.  Gastrointestinal: Negative for abdominal pain, diarrhea and vomiting.       Acid heartburn  Genitourinary: Negative for difficulty urinating.  Musculoskeletal: Negative for arthralgias.  Skin: Negative for rash.  Neurological: Positive for headaches. Negative for tremors and syncope.  Hematological: Does not bruise/bleed easily.       Objective:   Physical Exam  Somber wf / flat affect  Wt Readings from Last 3 Encounters:  05/22/16 148 lb 3.2 oz (67.2 kg)  04/30/16 148 lb 9.6 oz (67.4 kg)  01/23/16 151 lb (68.5 kg)    Vital signs reviewed    HEENT: nl dentition, turbinates, and oropharynx. Nl external ear canals without cough reflex   NECK :  without JVD/Nodes/TM/ nl carotid upstrokes bilaterally   LUNGS: no acc muscle use,  Nl contour chest which is clear to A and P bilaterally without cough on insp or exp maneuvers   CV:  RRR  no s3 or murmur or increase in P2, no edema   ABD:  soft and  nontender with nl inspiratory excursion in the supine position. No bruits or organomegaly, bowel sounds nl  MS:  Nl gait/ ext warm without deformities, calf tenderness, cyanosis or clubbing No obvious joint restrictions   SKIN: warm and dry without lesions    NEURO:  alert, approp, nl sensorium with  no motor deficits    CXR PA and Lateral:   07/01/2016 :    I personally reviewed images and agree with radiology impression as follows:    mild hyperinflation typical for age   Labs ordered/ reviewed:      Chemistry      Component Value Date/Time   NA 140 05/22/2016  0935   K 5.2 (H) 05/22/2016 0935   CL 106 05/22/2016 0935   CO2 27 05/22/2016 0935   BUN 13 05/22/2016 0935   CREATININE 0.78 05/22/2016 0935      Component Value Date/Time   CALCIUM 9.5 05/22/2016 0935   ALKPHOS 79 05/02/2014 0817   AST 22 05/02/2014 0817   ALT 8 05/02/2014 0817   BILITOT 0.7 05/02/2014 0817        Lab Results  Component Value Date   WBC 8.1 07/01/2016   HGB 13.3 07/01/2016   HCT 40.2 07/01/2016   MCV 90.8 07/01/2016   PLT 300.0 07/01/2016       Eos                         0.4   Allergy profile sent  07/01/2016       Lab Results  Component Value Date   TSH 2.52 05/22/2016     Lab Results  Component Value Date   PROBNP 77.0 07/01/2016               Assessment & Plan:

## 2016-07-01 NOTE — Progress Notes (Signed)
   Subjective:    Patient ID: Tracey Giles, female    DOB: 1951-02-09, 65 y.o.   MRN: [id number]  HPI    Review of Systems     Objective:   Physical Exam        Assessment & Plan:

## 2016-07-01 NOTE — Patient Instructions (Addendum)
Continue omeprazole 40 mg Take 30-60 min before first meal of the day and add  Pepcid ac 20 mg at bedtime  For drainage / throat tickle try take CHLORPHENIRAMINE  4 mg - take one every 4 hours as needed - available over the counter- may cause drowsiness so start with just a bedtime dose or two and see how you tolerate it before trying in daytime    GERD (REFLUX)  is an extremely common cause of respiratory symptoms just like yours , many times with no obvious heartburn at all.    It can be treated with medication, but also with lifestyle changes including elevation of the head of your bed (ideally with 6 inch  bed blocks),  Smoking cessation, avoidance of late meals, excessive alcohol, and avoid fatty foods, chocolate, peppermint, colas, red wine, and acidic juices such as orange juice.  NO MINT OR MENTHOL PRODUCTS SO NO COUGH DROPS   USE SUGARLESS CANDY INSTEAD (Jolley ranchers or Stover's or Life Savers) or even ice chips will also do - the key is to swallow to prevent all throat clearing. NO OIL BASED VITAMINS - use powdered substitutes.  Please remember to go to the lab and x-ray department downstairs for your tests - we will call you with the results when they are available.  Try taking toprol 100 one half at bfast and supper  Please see patient coordinator before you leave today  to schedule sinus CT   Please schedule a follow up office visit in 4 weeks, sooner if needed       .

## 2016-07-02 ENCOUNTER — Encounter: Payer: Self-pay | Admitting: Internal Medicine

## 2016-07-02 ENCOUNTER — Ambulatory Visit (INDEPENDENT_AMBULATORY_CARE_PROVIDER_SITE_OTHER)
Admission: RE | Admit: 2016-07-02 | Discharge: 2016-07-02 | Disposition: A | Discharge status: Home / Self Care | Payer: BC Managed Care – PPO | Source: Ambulatory Visit | Attending: Internal Medicine | Admitting: Internal Medicine

## 2016-07-02 ENCOUNTER — Telehealth: Payer: Self-pay | Admitting: Internal Medicine

## 2016-07-02 DIAGNOSIS — R05 Cough: Secondary | ICD-10-CM

## 2016-07-02 DIAGNOSIS — R058 Other specified cough: Secondary | ICD-10-CM

## 2016-07-02 LAB — RESPIRATORY ALLERGY PROFILE REGION II ~~LOC~~
Allergen, Cedar tree, t12: <0.1 kU/L
Allergen, Mouse Urine Protein, e78: <0.1 kU/L
Allergen, Oak,t7: <0.1 kU/L
Alternaria Alternata: <0.1 kU/L
Box Elder IgE: <0.1 kU/L
Cat Dander: 0.27 kU/L — ABNORMAL HIGH
D. farinae: <0.1 kU/L
DOG DANDER: 0.11 kU/L — AB
IGE (IMMUNOGLOBULIN E), SERUM: 29 kU/L (ref <115)
Johnson Grass: <0.1 kU/L
Penicillium Notatum: <0.1 kU/L
Rough Pigweed  IgE: <0.1 kU/L
Sheep Sorrel IgE: <0.1 kU/L

## 2016-07-02 MED ORDER — METOPROLOL SUCCINATE ER 100 MG PO TB24: ORAL_TABLET | ORAL | Status: DC

## 2016-07-02 NOTE — Progress Notes (Signed)
lmtcb

## 2016-07-02 NOTE — Assessment & Plan Note (Signed)
Allergy profile 07/01/2016 >  Eos 0.4/  IgE   - Sinus CT 07/02/2016 >>>    Classic Upper airway cough syndrome, so named because it's frequently impossible to sort out how much is  CR/sinusitis with freq throat clearing (which can be related to primary GERD)   vs  causing  secondary (" extra esophageal")  GERD from wide swings in gastric pressure that occur with throat clearing, often  promoting self use of mint and menthol lozenges that reduce the lower esophageal sphincter tone and exacerbate the problem further in a cyclical fashion.   These are the same pts (now being labeled as having "irritable larynx syndrome" by some cough centers) who not infrequently have a history of having failed to tolerate ace inhibitors,  dry powder inhalers or biphosphonates or report having atypical reflux symptoms that don't respond to standard doses of PPI which is clearly the case here , and are easily confused as having aecopd or asthma flares by even experienced allergists/ pulmonologists.   rec rx max for gerd now and explore sinus CT/ allergy profile and regroup once all the labs are back.  Total time devoted to counseling  = 35/75m review case with pt/ discussion of options/alternatives/ personally creating written instructions  in presence of pt  then going over those specific  Instructions directly with the pt including how to use all of the meds but in particular covering each new medication in detail and the difference between the maintenance/automatic meds and the prns using an action plan format for the latter.

## 2016-07-02 NOTE — Assessment & Plan Note (Addendum)
PFTs 05/28/16  Abn f/v loop with ERV 51  58/57  Corrected to 78%  - Spirometry 07/01/2016   No airflow obst   Symptoms are markedly disproportionate to objective findings and not clear this is a lung problem but pt does appear to have difficult airway management issues. DDX of  difficult airways management almost all start with A and  include Adherence, Ace Inhibitors, Acid Reflux, Active Sinus Disease, Alpha 1 Antitripsin deficiency, Anxiety masquerading as Airways dz,  ABPA,  Allergy(esp in young), Aspiration (esp in elderly), Adverse effects of meds,  Active smokers, A bunch of PE's (a small clot burden can't cause this syndrome unless there is already severe underlying pulm or vascular dz with poor reserve) plus two Bs  = Bronchiectasis and Beta blocker use..and one C= CHF  Adherence is always the initial "prime suspect" and is a multilayered concern that requires a "trust but verify" approach in every patient - starting with knowing how to use medications, especially inhalers, correctly, keeping up with refills and understanding the fundamental difference between maintenance and prns vs those medications only taken for a very short course and then stopped and not refilled.   ? Acid (or non-acid) GERD > always difficult to exclude as up to 75% of pts in some series report no assoc GI/ Heartburn symptoms> rec max (24h)  acid suppression and diet restrictions/ reviewed and instructions given in writing.   ? Anxiety > usually at the bottom of this list of usual suspects but should be much higher on this pt's based on H and P and note already on psychotropics with h/o MVP - the paroxysms of spont sob at rest that resolve spont in seconds to minutes are typical of panic disorder.  ? BB effect > rec break the toprol into two doses and consider  in this setting using  Bystolic, the most beta -1  selective Beta blocker available in sample form, with bisoprolol the most selective generic choice  on the market.    ? chf > excluded with bnp <100   If dx remains elusive would next consider cpst to complete the workup but for now would focus on the upper airway (see separate a/p)

## 2016-07-02 NOTE — Telephone Encounter (Signed)
Result Notes  Notes Recorded by Christen Butter, CMA on 07/02/2016 at 2:01 PM EDT Spoke with pt and notified of results per Dr. Sherene Sires. Pt verbalized understanding and denied any questions.  ------  Notes Recorded by Christen Butter, CMA on 07/02/2016 at 11:46 AM EDT lmtcb   Pt was notified of her cxr results

## 2016-07-02 NOTE — Progress Notes (Signed)
Spoke with pt and notified of results per Dr. Wert. Pt verbalized understanding and denied any questions. 

## 2016-07-03 NOTE — Progress Notes (Signed)
Spoke with pt and notified of results per Dr. Wert. Pt verbalized understanding and denied any questions. 

## 2016-07-30 ENCOUNTER — Telehealth (HOSPITAL_COMMUNITY): Payer: Self-pay | Admitting: Vascular Surgery

## 2016-07-30 ENCOUNTER — Ambulatory Visit (INDEPENDENT_AMBULATORY_CARE_PROVIDER_SITE_OTHER): Payer: BC Managed Care – PPO | Admitting: Internal Medicine

## 2016-07-30 ENCOUNTER — Encounter: Payer: Self-pay | Admitting: Internal Medicine

## 2016-07-30 VITALS — BP 112/82 | HR 60 | Ht 68.0 in | Wt 151.8 lb

## 2016-07-30 DIAGNOSIS — R058 Other specified cough: Secondary | ICD-10-CM

## 2016-07-30 DIAGNOSIS — R0600 Dyspnea, unspecified: Secondary | ICD-10-CM

## 2016-07-30 DIAGNOSIS — R05 Cough: Secondary | ICD-10-CM | POA: Diagnosis not present

## 2016-07-30 MED ORDER — AZELASTINE-FLUTICASONE 137-50 MCG/ACT NA SUSP: NASAL | 11 refills | Status: DC

## 2016-07-30 MED ORDER — AZELASTINE-FLUTICASONE 137-50 MCG/ACT NA SUSP: 2.0000 | Freq: Every day | NASAL | 0 refills | Status: DC

## 2016-07-30 NOTE — Progress Notes (Signed)
Subjective:   Patient ID: Tracey Giles, female    DOB: [birth year]   MRN: [id number]   Brief patient profile:  87 yowf never smoker with new sensation of post nasal drainge x around  2012  no resp to clariton then started short of sob fall 2016 > neg  treadmill at [facility] Dec 2016 gradually worse since then so referred to pulmonary clinic 07/01/2016 by Dr Selena Batten with abn pfts done 05/28/16 suggesting airflow obstruction but not with typical f/v contours   History of Present Illness  07/01/2016 1st [facility] office visit/ Wert  On ppi daily/ toprol 100 mg q  Pm  Chief Complaint  Patient presents with  . Pulmonary Consult    Referred by Dr. Kriste Basque. Pt c/o CP "deep and burning and goes to my back" with exertion x 6 months. She also feels that she is unable to take a good, deep breath even if she is resting.   throat clearing all year round x 2 years  esp p stirring in am and immediately when supine occ yellow mucus but really minimal  Doe x  9 months assoc with brief unexplained paroxysms of Sob at rest does not happen every day but resolve in a few sec to min s rx and  every time walks up hills gets sob/ feels almost like choked at times and has h/o GERD with active HB on PPI rec Continue omeprazole 40 mg Take 30-60 min before first meal of the day and add  Pepcid ac 20 mg at bedtime For drainage / throat tickle try take CHLORPHENIRAMINE  4 mg - take one every 4 hours as needed - available over the counter GERD  Diet  Try taking toprol 100 one half at bfast and supper schedule sinus CT    07/30/2016  f/u ov/Wert re: unexplained sob / pnds no better on gerd rx  Chief Complaint  Patient presents with  . Follow-up    Breathing and CP are unchanged. She denies any new co's today.    every night when lie down aware of drainage issues on h1h2 hs Loses breath at rest 4/7 days a week   No change in ex tolerance                          Objective:   Physical  Exam  Somber wf / flat affect  07/30/2016       152   07/30/2016      151  05/22/16 148 lb 3.2 oz (67.2 kg)  04/30/16 148 lb 9.6 oz (67.4 kg)  01/23/16 151 lb (68.5 kg)    Vital signs reviewed    HEENT: nl dentition, turbinates, and oropharynx. Nl external ear canals without cough reflex   NECK :  without JVD/Nodes/TM/ nl carotid upstrokes bilaterally   LUNGS: no acc muscle use,  Nl contour chest which is clear to A and P bilaterally without cough on insp or exp maneuvers   CV:  RRR  no s3 or murmur or increase in P2, no edema   ABD:  soft and nontender with nl inspiratory excursion in the supine position. No bruits or organomegaly, bowel sounds nl  MS:  Nl gait/ ext warm without deformities, calf tenderness, cyanosis or clubbing No obvious joint restrictions   SKIN: warm and dry without lesions    NEURO:  alert, approp, nl sensorium with  no motor deficits    CXR PA and Lateral:  07/01/2016 :    I personally reviewed images and agree with radiology impression as follows:    mild hyperinflation typical for age   Labs ordered/ reviewed:      Chemistry      Component Value Date/Time   NA 140 05/22/2016 0935   K 5.2 (H) 05/22/2016 0935   CL 106 05/22/2016 0935   CO2 27 05/22/2016 0935   BUN 13 05/22/2016 0935   CREATININE 0.78 05/22/2016 0935      Component Value Date/Time   CALCIUM 9.5 05/22/2016 0935   ALKPHOS 79 05/02/2014 0817   AST 22 05/02/2014 0817   ALT 8 05/02/2014 0817   BILITOT 0.7 05/02/2014 0817        Lab Results  Component Value Date   WBC 8.1 07/01/2016   HGB 13.3 07/01/2016   HCT 40.2 07/01/2016   MCV 90.8 07/01/2016   PLT 300.0 07/01/2016       Eos                         0.4          Lab Results  Component Value Date   TSH 2.52 05/22/2016     Lab Results  Component Value Date   PROBNP 77.0 07/01/2016               Assessment & Plan:

## 2016-07-30 NOTE — Patient Instructions (Addendum)
Try chlorpheniramine 4 mg x 2 and Pepcid 20 mg one hour before lying down to see if helps your night time drainage   Try dymista sample one puff twice daily instead  Of flonase but stop if not better after finish the sample   Please see patient coordinator before you leave today  to schedule cpst and I will you with the results

## 2016-07-30 NOTE — Telephone Encounter (Signed)
LEFT PT MESSAGE TO MAKE CPX APPT 

## 2016-08-02 NOTE — Assessment & Plan Note (Signed)
PFTs 05/28/16  Abn f/v loop with ERV 51  58/57  Corrected to 78%  - Spirometry 07/01/2016   No airflow obst   I doubt that her sob is due to a physiologic problem since it's just as likely to happen at rest as with heavy exertion but best way to sort out is to proceed with cpst with spirometry befor and after to r/o EIA  I had an extended discussion with the patient reviewing all relevant studies completed to date and  lasting 15 to 20 minutes of a 25 minute visit    Each maintenance medication was reviewed in detail including most importantly the difference between maintenance and prns and under what circumstances the prns are to be triggered using an action plan format that is not reflected in the computer generated alphabetically organized AVS.    Please see instructions for details which were reviewed in writing and the patient given a copy highlighting the part that I personally wrote and discussed at today's ov.

## 2016-08-02 NOTE — Assessment & Plan Note (Addendum)
Allergy profile 07/01/2016 >  Eos 0.4/  IgE  29 Pos RAST cat > dog - Sinus CT 07/02/2016 >> mucosal thickening within the left maxillary sinus. However the remainder of the paranasal sinuses are well pneumatized and no air-fluid level is seen. The nasal turbinates are slightly prominent somewhat compromising the nasal airway  - Rx for gerd 07/01/16  To 07/30/2016 no better at all > added h1 x one hour prior to hs and trial of dysmista

## 2016-08-06 ENCOUNTER — Ambulatory Visit (HOSPITAL_COMMUNITY): Payer: BC Managed Care – PPO | Attending: Internal Medicine

## 2016-08-06 ENCOUNTER — Encounter (HOSPITAL_COMMUNITY): Payer: Self-pay | Admitting: *Deleted

## 2016-08-06 DIAGNOSIS — R0600 Dyspnea, unspecified: Secondary | ICD-10-CM

## 2016-08-07 ENCOUNTER — Other Ambulatory Visit: Payer: Self-pay | Admitting: *Deleted

## 2016-08-07 MED ORDER — MONTELUKAST SODIUM 10 MG PO TABS: 10.0000 mg | ORAL_TABLET | Freq: Every day | ORAL | 0 refills | Status: DC

## 2016-08-07 NOTE — Progress Notes (Signed)
Spoke with pt and notified of results per Dr. Wert. Pt verbalized understanding and denied any questions. 

## 2016-08-11 DIAGNOSIS — R0600 Dyspnea, unspecified: Secondary | ICD-10-CM | POA: Diagnosis not present

## 2016-08-13 ENCOUNTER — Ambulatory Visit: Payer: BC Managed Care – PPO | Admitting: Family Medicine

## 2016-08-14 ENCOUNTER — Encounter: Payer: Self-pay | Admitting: Family Medicine

## 2016-08-14 ENCOUNTER — Ambulatory Visit (INDEPENDENT_AMBULATORY_CARE_PROVIDER_SITE_OTHER): Payer: Medicare Other | Admitting: Family Medicine

## 2016-08-14 VITALS — BP 112/68 | HR 65 | Temp 98.2°F | Ht 68.0 in | Wt 152.0 lb

## 2016-08-14 DIAGNOSIS — R002 Palpitations: Secondary | ICD-10-CM | POA: Diagnosis not present

## 2016-08-14 DIAGNOSIS — H8102 Meniere's disease, left ear: Secondary | ICD-10-CM

## 2016-08-14 DIAGNOSIS — E039 Hypothyroidism, unspecified: Secondary | ICD-10-CM | POA: Diagnosis not present

## 2016-08-14 DIAGNOSIS — E559 Vitamin D deficiency, unspecified: Secondary | ICD-10-CM

## 2016-08-14 DIAGNOSIS — Z23 Encounter for immunization: Secondary | ICD-10-CM

## 2016-08-14 DIAGNOSIS — K219 Gastro-esophageal reflux disease without esophagitis: Secondary | ICD-10-CM

## 2016-08-14 DIAGNOSIS — R0600 Dyspnea, unspecified: Secondary | ICD-10-CM

## 2016-08-14 NOTE — Patient Instructions (Addendum)
BEFORE YOU LEAVE: -follow up: physical  in 3-4 months if no physical in 1 year  -flu shot  Keep follow up with your lung doctor as planned.   We recommend the following healthy lifestyle for LIFE: 1) Small portions.   Tip: eat off of a salad plate instead of a dinner plate.  Tip: It is ok to feel hungry after a meal - that likely means you ate an appropriate portion.  Tip: if you need more or a snack choose fruits, veggies and/or a handful of nuts or seeds.  2) Eat a healthy clean diet.  * Tip: Avoid (less then 1 serving per week): processed foods, sweets, sweetened drinks, white starches (rice, flour, bread, potatoes, pasta, etc), red meat, fast foods, butter  *Tip: CHOOSE instead   * 5-9 servings per day of fresh or frozen fruits and vegetables (but not corn, potatoes, bananas, canned or dried fruit)   *nuts and seeds, beans   *olives and olive oil   *small portions of lean meats such as fish and white chicken    *small portions of whole grains  3)Get at least 150 minutes of sweaty aerobic exercise per week.  4)Reduce stress - consider counseling, meditation and relaxation to balance other aspects of your life.

## 2016-08-14 NOTE — Progress Notes (Signed)
HPI:  Follow up:  Hypothyroid: -chronic -meds: levothyroxine 75 -denies: no changes in weight, no hot/cold intol, skin changes  Palpitations/HTN: -meds: metoprolol, -denies: DOE, jaw pain, arm pain, SOB -she had a normal echocardiogram despite a reported PMH MVP -she has had negative stress echo for chest burning with exercise  Dyspnea: -seeing pulm for eval; not sure of etiology as of last note -cxr and CT sinuses per pulm notes unremarkable - though the radiologist comments on copd and mild sinus disease -pt today reports had cpst and was told has asthma and was started on Singulair with pulm follow up soon -no change in symptoms  GERD: -meds: omeprazole -stable -denies reflux, dysphagia  Meniere's Disease: -reports stable -sees Dr. Jac CanavanKrauss for this  Vit D def: -taking vit D  ROS: See pertinent positives and negatives per HPI.  Past Medical History:  Diagnosis Date  . Essential hypertension   . History of dermatitis   . History of migraine headaches   . Hyperlipemia 05/24/2016  . Hypothyroidism   . MVP (mitral valve prolapse)    Reported history  . Palpitations    Rare PVCs by Holter monitor April 2011   . Sleep apnea    Reportedly mild  . Vitamin D insufficiency 05/24/2016    Past Surgical History:  Procedure Laterality Date  . APPENDECTOMY    . BREAST LUMPECTOMY     Benign    Family History  Problem Relation Age of Onset  . Melanoma Father   . Stroke Mother   . Asthma Mother   . Stroke Maternal Grandfather   . Asthma Brother     Social History   Social History  . Marital status: Married    Spouse name: N/A  . Number of children: N/A  . Years of education: N/A   Occupational History  . [employer]   Social History Main Topics  . Smoking status: Never Smoker  . Smokeless tobacco: None  . Alcohol use No  . Drug use: No  . Sexual activity: Yes   Other Topics Concern  . 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  Azelastine-Fluticasone (DYMISTA) 137-50 MCG/ACT SUSP, Place 2 puffs into the nose daily., Disp: 1 Bottle, Rfl: 0 .  chlorpheniramine (CHLOR-TRIMETON) 4 MG tablet, Take 4 mg by mouth every 4 (four) hours as needed for allergies., Disp: , Rfl:  .  famotidine (PEPCID) 20 MG tablet, One at bedtime, Disp: 30 tablet, Rfl: 11 .  levothyroxine (SYNTHROID, LEVOTHROID) 75 MCG tablet, Take 1 tablet (75 mcg total) by mouth daily., Disp: 90 tablet, Rfl: 3 .  LORazepam (ATIVAN) 0.5 MG tablet, Use 1/2 to 1 tablet as needed for panic attacks/anxiety, Disp: 30 tablet, Rfl: 0 .  metoprolol succinate (TOPROL-XL) 100 MG 24 hr tablet, Take one half twice daily, Disp: , Rfl:  .  montelukast (SINGULAIR) 10 MG tablet, Take 1 tablet (10 mg total) by mouth at bedtime., Disp: 30 tablet, Rfl: 0 .  omeprazole (PRILOSEC) 40 MG capsule, Take 1 capsule (40 mg total) by mouth daily., Disp: 30 capsule, Rfl: 6  EXAM:  Vitals:   08/14/16 0946  BP: 112/68  Pulse: 65  Temp: 98.2 F (36.8 C)    Body mass index is 23.11 kg/m.  GENERAL: vitals reviewed and listed above, alert, oriented, appears well hydrated and in no acute distress  HEENT: atraumatic, conjunttiva clear, no obvious abnormalities on inspection of external nose and ears  NECK: no obvious masses on inspection  LUNGS: clear to auscultation bilaterally, no wheezes, rales or rhonchi, good air movement  CV: HRRR, no peripheral edema  MS: moves all extremities without noticeable abnormality  PSYCH: pleasant and cooperative, no obvious depression or anxiety  ASSESSMENT AND PLAN:  Discussed the following assessment and plan:  Hypothyroidism, unspecified hypothyroidism type  Palpitations  Dyspnea  Gastroesophageal reflux disease  without esophagitis  Vitamin D insufficiency  Meniere's disease, left  -doing ok with chronic disease satble - I am happy it seems a potential cause of her dyspnea has been determined and she has follow up with pulm per her report to discuss further tx options - for now she has not noticed much change on singulair but has only been a few days -labs at CPE in 3-4 months -flu shot today -Patient advised to return or notify a doctor immediately if symptoms worsen or persist or new concerns arise.  Patient Instructions  BEFORE YOU LEAVE: -follow up: physical  in 3-4 months if no physical in 1 year  -flu shot  Keep follow up with your lung doctor as planned.   We recommend the following healthy lifestyle for LIFE: 1) Small portions.   Tip: eat off of a salad plate instead of a dinner plate.  Tip: It is ok to feel hungry after a meal - that likely means you ate an appropriate portion.  Tip: if you need more or a snack choose fruits, veggies and/or a handful of nuts or seeds.  2) Eat a healthy clean diet.  * Tip: Avoid (less then 1 serving per week): processed foods, sweets, sweetened drinks, white starches (rice, flour, bread, potatoes, pasta, etc), red meat, fast foods, butter  *Tip: CHOOSE instead   * 5-9 servings per day of fresh or frozen fruits and vegetables (but not corn, potatoes, bananas, canned or dried fruit)   *nuts and seeds, beans   *olives and olive oil   *small portions of lean meats such as fish and white chicken    *small portions of whole grains  3)Get at least 150 minutes of sweaty aerobic exercise per week.  4)Reduce stress - consider counseling, meditation and relaxation to balance other aspects of your life.     Kriste Basque R., DO

## 2016-08-14 NOTE — Progress Notes (Signed)
Pre visit review using our clinic review tool, if applicable. No additional management support is needed unless otherwise documented below in the visit note. 

## 2016-08-21 ENCOUNTER — Encounter: Payer: Self-pay | Admitting: Internal Medicine

## 2016-08-21 ENCOUNTER — Ambulatory Visit (INDEPENDENT_AMBULATORY_CARE_PROVIDER_SITE_OTHER): Payer: Medicare Other | Admitting: Internal Medicine

## 2016-08-21 VITALS — BP 126/74 | HR 60 | Ht 68.0 in | Wt 153.0 lb

## 2016-08-21 DIAGNOSIS — R058 Other specified cough: Secondary | ICD-10-CM

## 2016-08-21 DIAGNOSIS — R05 Cough: Secondary | ICD-10-CM

## 2016-08-21 DIAGNOSIS — R0600 Dyspnea, unspecified: Secondary | ICD-10-CM

## 2016-08-21 NOTE — Patient Instructions (Addendum)
To get the most out of exercise, you need to be continuously aware that you are short of breath, but never out of breath, for 30 minutes daily. As you improve, it will actually be easier for you to do the same amount of exercise  in  30 minutes so always push to the level where you are short of breath.    If chest discomfort not improving with regular exercise you will need to make appt to see Dr Diona BrownerMcDowell again  Please schedule a follow up visit in 3 months but call sooner if needed

## 2016-08-21 NOTE — Progress Notes (Signed)
Subjective:   Patient ID: Tracey FischerCatherine M Giles, female    DOB: Nov 28, 1951   MRN: [id number]   Brief patient profile:  [age] yowf never smoker with new sensation of post nasal drainage x around  2012  no resp to clariton then started short of sob fall 2016 > neg  [facility] Tracey BrownerMcDowell gradually worse since then so referred to pulmonary clinic 07/01/2016 by Dr Tracey BattenKim with abn pfts done 05/28/16 suggesting airflow obstruction but not with typical f/v contours   History of Present Illness  07/01/2016 1st [facility] office visit/ Tracey Giles  On ppi daily/ toprol 100 mg q  Pm  Chief Complaint  Patient presents with  . Pulmonary Consult    Referred by Dr. Kriste BasqueHannah Kim. Pt c/o CP "deep and burning and goes to my back" with exertion x 6 months. She also feels that she is unable to take a good, deep breath even if she is resting.   throat clearing all year round x 2 years  esp p stirring in am and immediately when supine occ yellow mucus but really minimal  Doe x  9 months assoc with brief unexplained paroxysms of Sob at rest does not happen every day but resolve in a few sec to min s rx and  every time walks up hills gets sob/ feels almost like choked at times and has h/o GERD with active HB on PPI rec Continue omeprazole 40 mg Take 30-60 min before first meal of the day and add  Pepcid ac 20 mg at bedtime For drainage / throat tickle try take CHLORPHENIRAMINE  4 mg - take one every 4 hours as needed - available over the counter GERD  Diet  Try taking toprol 100 one half at bfast and supper schedule sinus CT    07/30/2016  f/u ov/Tracey Giles re: unexplained sob / pnds no better on gerd rx  Chief Complaint  Patient presents with  . Follow-up    Breathing and CP are unchanged. She denies any new co's today.   every night when lie down aware of drainage issues on h1h2 hs Loses breath at rest 4/7 days a week   No change in ex tolerance  rec Try chlorpheniramine 4 mg x 2 and Pepcid 20 mg one hour  before lying down to see if helps your night time drainage  Try dymista sample one puff twice daily instead  Of flonase but stop if not better after finish the sample   schedule cpst and I will you with the results     CPST 08/06/16  Exercise testing with gas exchange demonstrates a mild-to-moderate functional impairment when compared to matched sedentary norms. Patient appears to likely have mild circulatory limitation indicated with low VO2WorkSLope and low-flat O2 pulse (suggesting possible ischemia, however ECG within normal limits). However, at peak exercise, patient appears primarily limited due to exercise-induced bronchospasm with mild-moderate reduction in FEV1 for up to 15 minutes post-exercise  rec 08/07/2016  : singulair trial x 2 weeks then ov        08/21/2016  f/u ov/Tracey Giles re:  EIA rx singuliar 10 mg around 08/07/16  Chief Complaint  Patient presents with  . Follow-up    Cough has improved slightly.  She has started to feeling aggitated since starting on singulair and wonders if this could be a side effect.    drainage is better and not convinced the dymista helped so stopped  Walking 30 min daily slow pace and improving but  hasn't tried hills yet Not using any inhalers    No obvious day to day or daytime variability or assoc excess/ purulent sputum or mucus plugs or hemoptysis or cp or chest tightness, subjective wheeze or overt sinus or hb symptoms. No unusual exp hx or h/o childhood pna/ asthma or knowledge of premature birth.  Sleeping ok without nocturnal  or early am exacerbation  of respiratory  c/o's or need for noct saba. Also denies any obvious fluctuation of symptoms with weather or environmental changes or other aggravating or alleviating factors except as outlined above   Current Medications, Allergies, Complete Past Medical History, Past Surgical History, Family History, and Social History were reviewed in Tracey Giles record.  ROS  The  following are not active complaints unless bolded sore throat, dysphagia, dental problems, itching, sneezing,  nasal congestion or excess/ purulent secretions, ear ache,   fever, chills, sweats, unintended wt loss, classically pleuritic or exertional cp,  orthopnea pnd or leg swelling, presyncope, palpitations, abdominal pain, anorexia, nausea, vomiting, diarrhea  or change in bowel or bladder habits, change in stools or urine, dysuria,hematuria,  rash, arthralgias, visual complaints, headache, numbness, weakness or ataxia or problems with walking or coordination,  change in mood/affect or memory.            Objective:   Physical Exam  Somber wf / flat affect  08/21/2016       153  07/30/2016       152   07/30/2016      151  05/22/16 148 lb 3.2 oz (67.2 kg)  04/30/16 148 lb 9.6 oz (67.4 kg)  01/23/16 151 lb (68.5 kg)    Vital signs reviewed - sats 99% on arrival  RA    HEENT: nl dentition, turbinates, and oropharynx. Nl external ear canals without cough reflex   NECK :  without JVD/Nodes/TM/ nl carotid upstrokes bilaterally   LUNGS: no acc muscle use,  Nl contour chest which is clear to A and P bilaterally without cough on insp or exp maneuvers   CV:  RRR  no s3 or murmur or increase in P2, no edema   ABD:  soft and nontender with nl inspiratory excursion in the supine position. No bruits or organomegaly, bowel sounds nl  MS:  Nl gait/ ext warm without deformities, calf tenderness, cyanosis or clubbing No obvious joint restrictions   SKIN: warm and dry without lesions    NEURO:  alert, approp, nl sensorium with  no motor deficits    CXR PA and Lateral:   07/01/2016 :    I personally reviewed images and agree with radiology impression as follows:    mild hyperinflation typical for age   Labs ordered/ reviewed:      Chemistry      Component Value Date/Time   NA 140 05/22/2016 0935   K 5.2 (H) 05/22/2016 0935   CL 106 05/22/2016 0935   CO2 27 05/22/2016 0935   BUN 13  05/22/2016 0935   CREATININE 0.78 05/22/2016 0935      Component Value Date/Time   CALCIUM 9.5 05/22/2016 0935   ALKPHOS 79 05/02/2014 0817   AST 22 05/02/2014 0817   ALT 8 05/02/2014 0817   BILITOT 0.7 05/02/2014 0817        Lab Results  Component Value Date   WBC 8.1 07/01/2016   HGB 13.3 07/01/2016   HCT 40.2 07/01/2016   MCV 90.8 07/01/2016   PLT 300.0 07/01/2016  Eos                         0.4          Lab Results  Component Value Date   TSH 2.52 05/22/2016     Lab Results  Component Value Date   PROBNP 77.0 07/01/2016               Assessment & Plan:

## 2016-08-22 NOTE — Assessment & Plan Note (Signed)
PFTs 05/28/16  Abn f/v loop with ERV 51  58/57  Corrected to 78%  - Spirometry 07/01/2016   No airflow obst  - CPST 08/06/16  Exercise testing with gas exchange demonstrates a mild-to-moderate functional impairment when compared to matched sedentary norms. Patient appears to likely have mild circulatory limitation indicated with low VO2WorkSLope and low-flat O2 pulse (suggesting possible ischemia, however ECG within normal limits). However, at peak exercise, patient appears primarily limited due to exercise-induced bronchospasm with mild-moderate reduction in FEV1 for up to 15 minutes post-exercise  rec 08/07/2016  : singulair trial x 2 weeks then ov   Denies exp to cat or dogs/ doing better for now so no change in rx including GERD Rx until reaches def plateau in terms of response then try off this also

## 2016-08-22 NOTE — Assessment & Plan Note (Signed)
PFTs 05/28/16  Abn f/v loop with ERV 51  58/57  Corrected to 78%  - Spirometry 07/01/2016   No airflow obst  - CPST 08/06/16  Exercise testing with gas exchange demonstrates a mild-to-moderate functional impairment when compared to matched sedentary norms. Patient appears to likely have mild circulatory limitation indicated with low VO2WorkSLope and low-flat O2 pulse (suggesting possible ischemia, however ECG within normal limits). However, at peak exercise, patient appears primarily limited due to exercise-induced bronchospasm with mild-moderate reduction in FEV1 for up to 15 minutes post-exercise  rec 08/07/2016  : singulair trial x 2 weeks then ov    Improving on singulair though this may be placebo effect - will ask her to push to 30 min of ex where she is sob but not oob daily and f/u with cards if still notes chest discomfort with ex as could still have microvascular angina as per Dr Orson GearMcDowells' notes and flat 02 pulse curve as above   I had an extended discussion with the patient reviewing all relevant studies completed to date and  lasting 15 to 20 minutes of a 25 minute visit    Each maintenance medication was reviewed in detail including most importantly the difference between maintenance and prns and under what circumstances the prns are to be triggered using an action plan format that is not reflected in the computer generated alphabetically organized AVS.    Please see instructions for details which were reviewed in writing and the patient given a copy highlighting the part that I personally wrote and discussed at today's ov.

## 2016-10-09 ENCOUNTER — Encounter: Payer: Self-pay | Admitting: Family Medicine

## 2016-10-09 LAB — HM MAMMOGRAPHY

## 2016-10-09 IMAGING — DX DG CHEST 2V
2 series · 2 of 2 positions shown · non-contrast
Comparison: CT 08/22/2004.

CLINICAL DATA: Shortness of breath.

EXAM:
CHEST  2 VIEW

[chest pa]
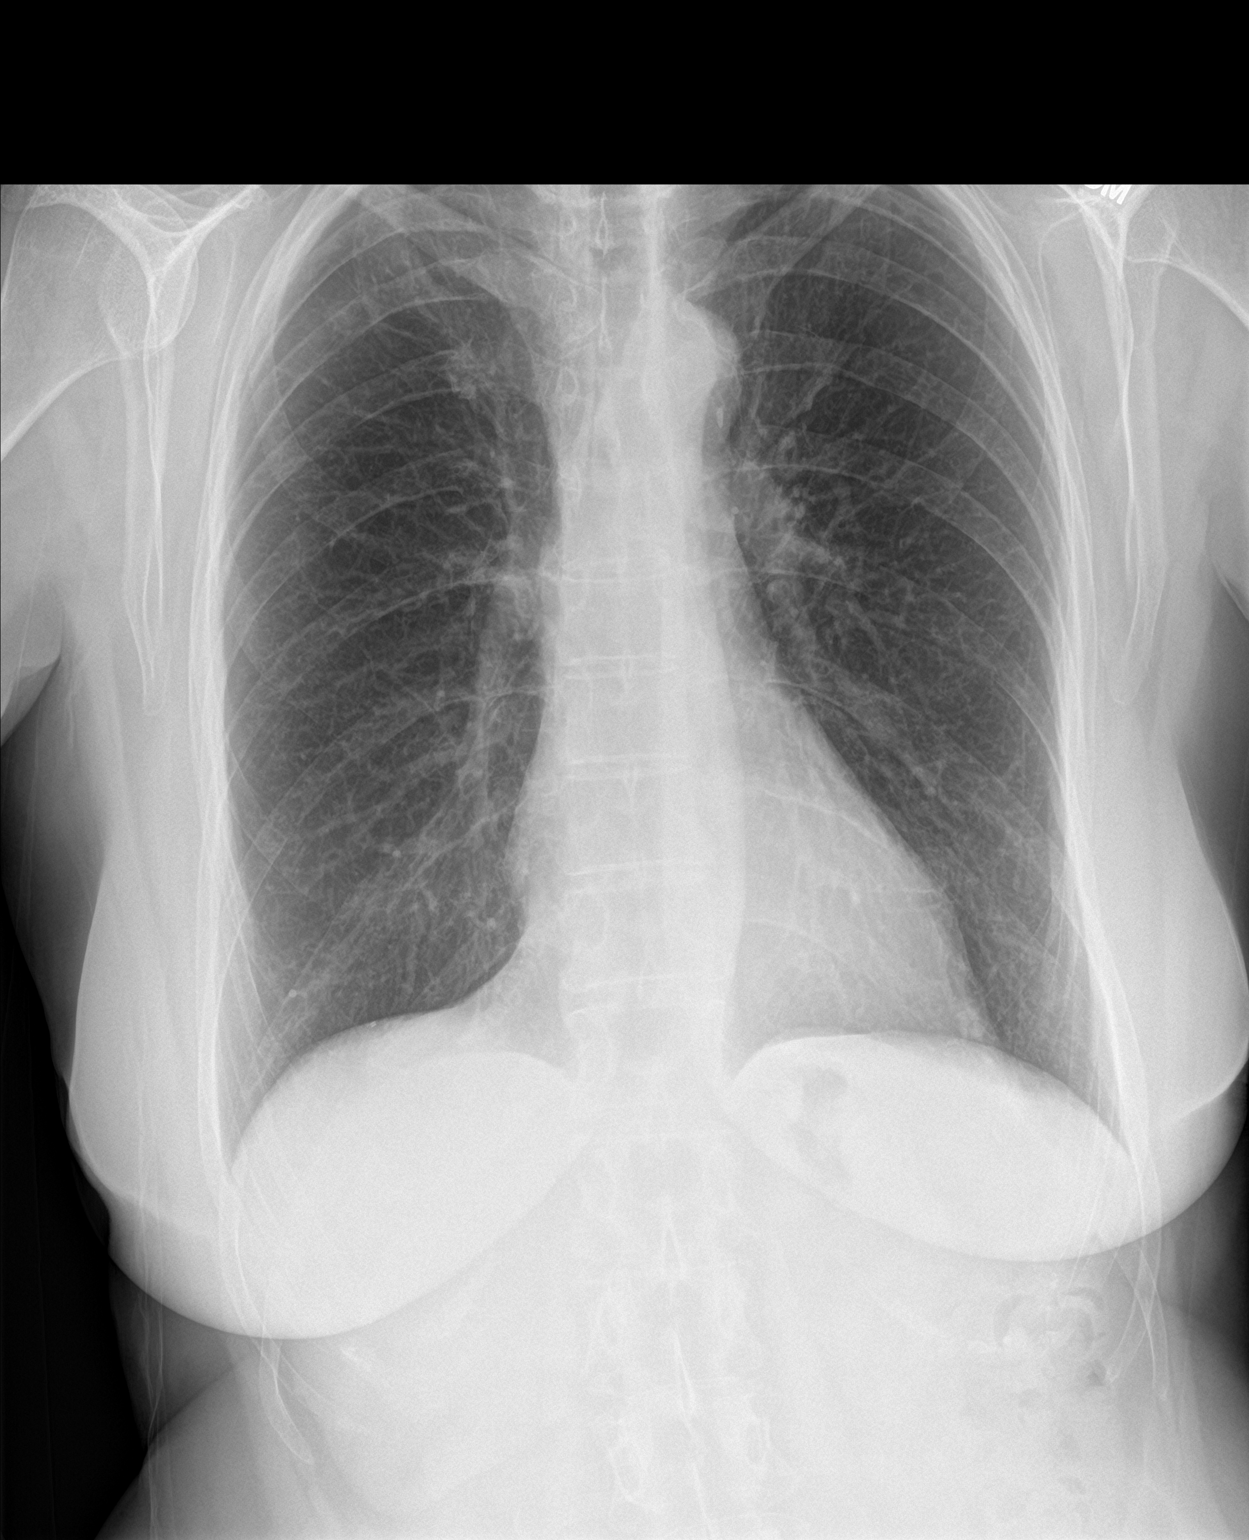

[chest lat]
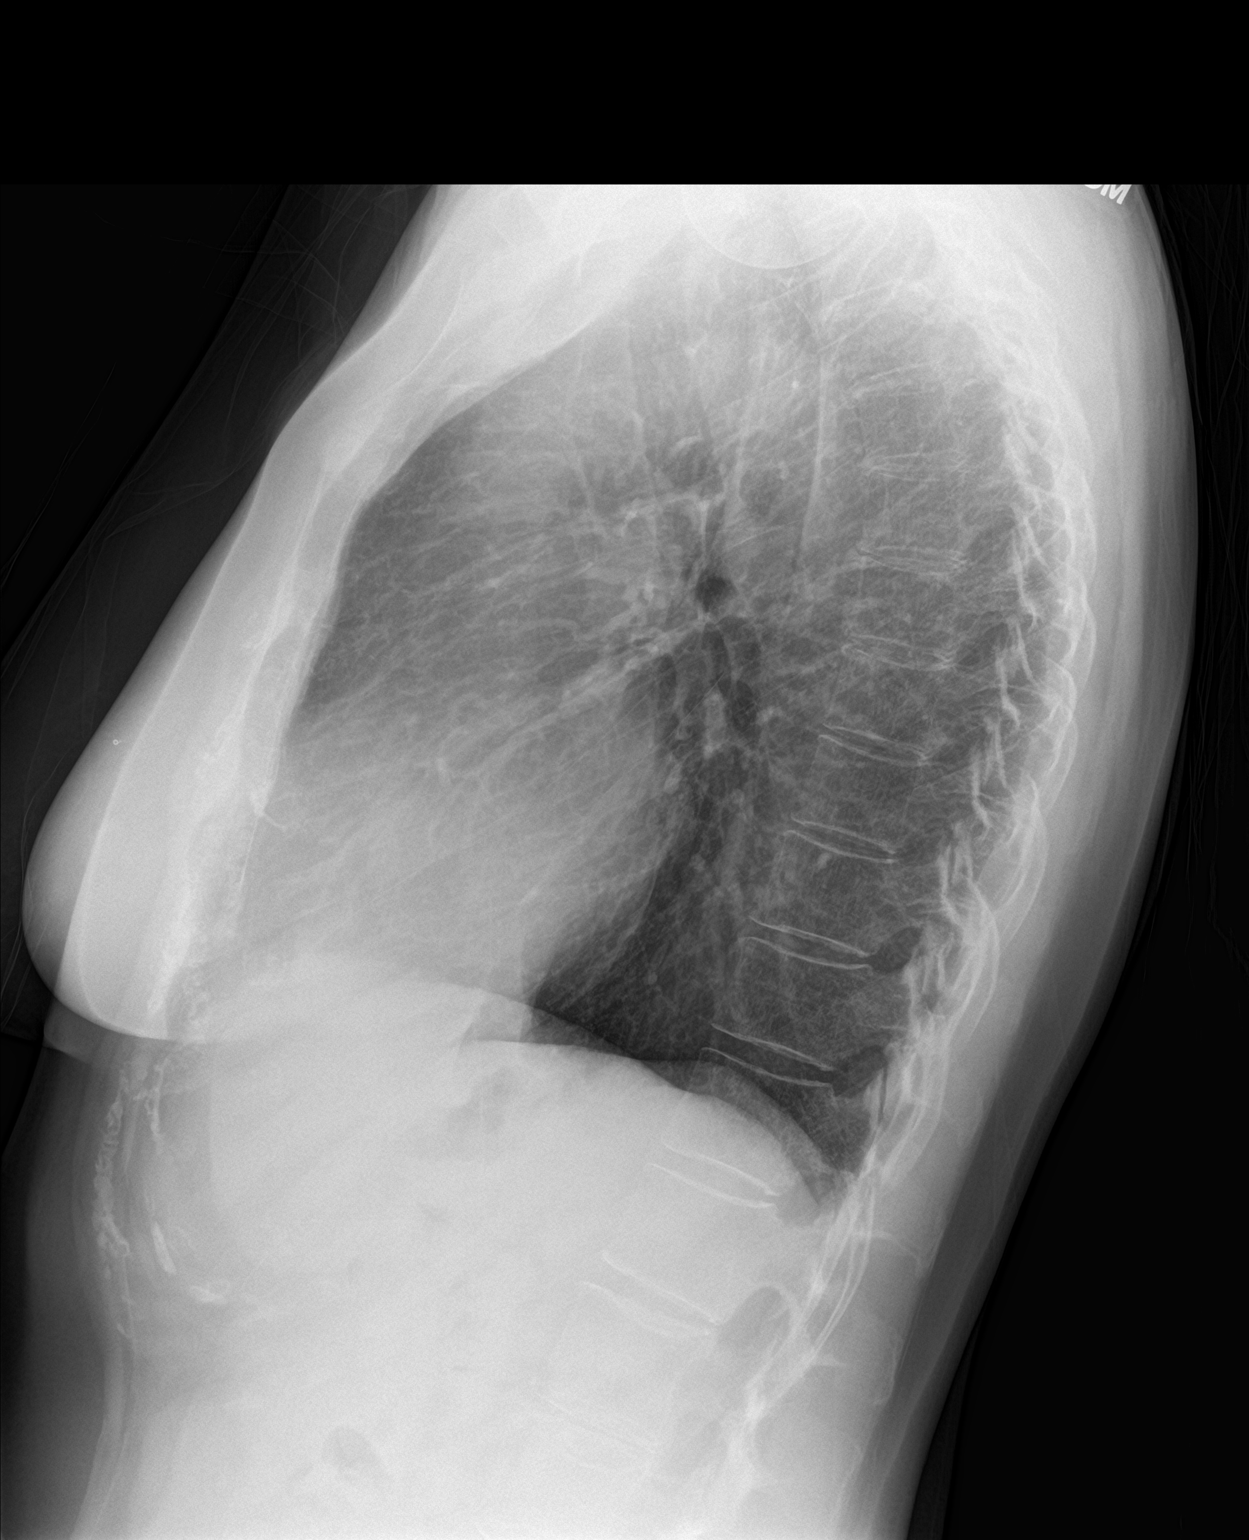

[2 of 2 positions shown; findings below may reference images not displayed]

FINDINGS: Mediastinum and hilar structures normal. Hyperexpansion of both lung
fields noted consistent COPD. Borderline cardiomegaly, no pulmonary
venous congestion. No pleural effusion or pneumothorax.
IMPRESSION: 1. COPD.

2.  Borderline cardiomegaly .

## 2016-11-20 ENCOUNTER — Ambulatory Visit (INDEPENDENT_AMBULATORY_CARE_PROVIDER_SITE_OTHER): Payer: Medicare Other | Admitting: Internal Medicine

## 2016-11-20 ENCOUNTER — Encounter: Payer: Self-pay | Admitting: Internal Medicine

## 2016-11-20 VITALS — BP 134/80 | HR 67 | Ht 68.0 in | Wt 153.0 lb

## 2016-11-20 DIAGNOSIS — R0609 Other forms of dyspnea: Secondary | ICD-10-CM

## 2016-11-20 DIAGNOSIS — R058 Other specified cough: Secondary | ICD-10-CM

## 2016-11-20 DIAGNOSIS — J45990 Exercise induced bronchospasm: Secondary | ICD-10-CM | POA: Diagnosis not present

## 2016-11-20 DIAGNOSIS — R05 Cough: Secondary | ICD-10-CM | POA: Diagnosis not present

## 2016-11-20 MED ORDER — MONTELUKAST SODIUM 10 MG PO TABS: 10.0000 mg | ORAL_TABLET | Freq: Every day | ORAL | 11 refills | Status: DC

## 2016-11-20 MED ORDER — BUDESONIDE-FORMOTEROL FUMARATE 80-4.5 MCG/ACT IN AERO: INHALATION_SPRAY | RESPIRATORY_TRACT | 11 refills | Status: DC

## 2016-11-20 NOTE — Patient Instructions (Addendum)
Continue the singulair x one month and then stop to see what difference if makes in any of your nasal symptoms  Try symbicort 80  2 pffs x 15 min before exercise and if better > continue, if not, see Diona BrownerMcDowell  Work on inhaler technique:  relax and gently blow all the way out then take a nice smooth deep breath back in, triggering the inhaler at same time you start breathing in.  Hold for up to 5 seconds if you can. Blow out thru nose. Rinse and gargle with water when done Late add: I do note that she take toprol 50 mg bid and might suggest that be changed over to bisoprolol if not responding to symbicort though doubt this low a dose would interfere with her resp to symbicort

## 2016-11-20 NOTE — Progress Notes (Signed)
Subjective:   Patient ID: Tracey Giles, female    DOB: Apr 13, 1951   MRN: [id number]   Brief patient profile:  65yowf never smoker with new sensation of post nasal drainage x around  2012  no resp to [facility] then started short of sob fall 2016 > neg  [facility] D[facility] Browner[facility] gradually worse since then so referred to pulmonary clinic 07/01/2016 by Dr Selena BattenKim with abn pfts done 05/28/16 suggesting airflow obstruction but not with typical f/v contours   History of Present Illness  07/01/2016 1st [facility] office visit/ [name]  On ppi daily/ toprol 100 mg q  Pm  Chief Complaint  Patient presents with  . Pulmonary Consult    Referred by Dr. Kriste BasqueHannah Kim. Pt c/o CP "deep and burning and goes to my back" with exertion x 6 months. She also feels that she is unable to take a good, deep breath even if she is resting.   throat clearing all year round x 2 years  esp p stirring in am and immediately when supine occ yellow mucus but really minimal  Doe x  9 months assoc with brief unexplained paroxysms of Sob at rest does not happen every day but resolve in a few sec to min s rx and  every time walks up hills gets sob/ feels almost like choked at times and has h/o GERD with active HB on PPI rec Continue omeprazole 40 mg Take 30-60 min before first meal of the day and add  Pepcid ac 20 mg at bedtime For drainage / throat tickle try take CHLORPHENIRAMINE  4 mg - take one every 4 hours as needed - available over the counter GERD  Diet  Try taking toprol 100 one half at bfast and supper schedule sinus CT    07/30/2016  f/u ov/[name] re: unexplained sob / pnds no better on gerd rx  Chief Complaint  Patient presents with  . Follow-up    Breathing and CP are unchanged. She denies any new co's today.   every night when lie down aware of drainage issues on h1h2 hs Loses breath at rest 4/7 days a week   No change in ex tolerance  rec Try chlorpheniramine 4 mg x 2 and Pepcid 20 mg one hour  before lying down to see if helps your night time drainage  Try dymista sample one puff twice daily instead  Of flonase but stop if not better after finish the sample   schedule cpst and I will you with the results     CPST 08/06/16  Exercise testing with gas exchange demonstrates a mild-to-moderate functional impairment when compared to matched sedentary norms. Patient appears to likely have mild circulatory limitation indicated with low VO2WorkSLope and low-flat O2 pulse (suggesting possible ischemia, however ECG within normal limits). However, at peak exercise, patient appears primarily limited due to exercise-induced bronchospasm with mild-moderate reduction in FEV1 for up to 15 minutes post-exercise  rec 08/07/2016  : singulair trial x 2 weeks then ov    08/21/2016  f/u ov/[name] re:  EIA rx singuliar 10 mg around 08/07/16  Chief Complaint  Patient presents with  . Follow-up    Cough has improved slightly.  She has started to feeling aggitated since starting on singulair and wonders if this could be a side effect.   drainage is better and not convinced the dymista helped so stopped  Walking 30 min daily slow pace and improving but hasn't tried hills yet Not using  any inhalers  rec To get the most out of exercise, you need to be continuously aware that you are short of breath, but never out of breath, for 30 minutes daily. As you improve, it will actually be easier for you to do the same amount of exercise  in  30 minutes so always push to the level where you are short of breath.   If chest discomfort not improving with regular exercise you will need to make appt to see Dr Diona Browner again    11/20/2016  f/u ov/[name] re: ? EIA no better on singulair ? Helps sinus sympotms Chief Complaint  Patient presents with  . Follow-up    Breathing is unchaged. She still has some chest discomfort with very heavy exertion, but does fine at a normal pace. Not coughing much.   maint symptom is chest burning  proportional to ex/ immediately better when stops (not at all c/w EIA) and does not matter indoors vs outdoors or weather conditions but rather determined by  intensity of exertion / very min cough not related to ex  No obvious day to day or daytime variability or assoc excess/ purulent sputum or mucus plugs or hemoptysis or cp or chest tightness, subjective wheeze or overt sinus or hb symptoms. No unusual exp hx or h/o childhood pna/ asthma or knowledge of premature birth.  Sleeping ok without nocturnal  or early am exacerbation  of respiratory  c/o's or need for noct saba. Also denies any obvious fluctuation of symptoms with weather or environmental changes or other aggravating or alleviating factors except as outlined above   Current Medications, Allergies, Complete Past Medical History, Past Surgical History, Family History, and Social History were reviewed in [employer] record.  ROS  The following are not active complaints unless bolded sore throat, dysphagia, dental problems, itching, sneezing,  nasal congestion or excess/ purulent secretions, ear ache,   fever, chills, sweats, unintended wt loss, classically pleuritic or exertional cp,  orthopnea pnd or leg swelling, presyncope, palpitations, abdominal pain, anorexia, nausea, vomiting, diarrhea  or change in bowel or bladder habits, change in stools or urine, dysuria,hematuria,  rash, arthralgias, visual complaints, headache, numbness, weakness or ataxia or problems with walking or coordination,  change in mood/affect or memory.            Objective:   Physical Exam  Somber wf / flat affect  11/20/2016      153  08/21/2016       153  07/30/2016       152   07/30/2016      151  05/22/16 148 lb 3.2 oz (67.2 kg)  04/30/16 148 lb 9.6 oz (67.4 kg)  01/23/16 151 lb (68.5 kg)    Vital signs reviewed  - Note on arrival 02 sats  96% on RA     HEENT: nl dentition, turbinates, and oropharynx. Nl external ear canals  without cough reflex   NECK :  without JVD/Nodes/TM/ nl carotid upstrokes bilaterally   LUNGS: no acc muscle use,  Nl contour chest which is clear to A and P bilaterally without cough on insp or exp maneuvers   CV:  RRR  no s3 or murmur or increase in P2, no edema   ABD:  soft and nontender with nl inspiratory excursion in the supine position. No bruits or organomegaly, bowel sounds nl  MS:  Nl gait/ ext warm without deformities, calf tenderness, cyanosis or clubbing No obvious joint restrictions   SKIN: warm  and dry without lesions    NEURO:  alert, approp, nl sensorium with  no motor deficits    CXR PA and Lateral:   07/01/2016 :    I personally reviewed images and agree with radiology impression as follows:    mild hyperinflation typical for age   Labs reviewed:      Chemistry      Component Value Date/Time   NA 140 05/22/2016 0935   K 5.2 (H) 05/22/2016 0935   CL 106 05/22/2016 0935   CO2 27 05/22/2016 0935   BUN 13 05/22/2016 0935   CREATININE 0.78 05/22/2016 0935      Component Value Date/Time   CALCIUM 9.5 05/22/2016 0935   ALKPHOS 79 05/02/2014 0817   AST 22 05/02/2014 0817   ALT 8 05/02/2014 0817   BILITOT 0.7 05/02/2014 0817        Lab Results  Component Value Date   WBC 8.1 07/01/2016   HGB 13.3 07/01/2016   HCT 40.2 07/01/2016   MCV 90.8 07/01/2016   PLT 300.0 07/01/2016       Eos                         0.4          Lab Results  Component Value Date   TSH 2.52 05/22/2016     Lab Results  Component Value Date   PROBNP 77.0 07/01/2016               Assessment & Plan:

## 2016-11-22 NOTE — Assessment & Plan Note (Signed)
Allergy profile 07/01/2016 >  Eos 0.4/  IgE  29 Pos RAST cat > dog - Sinus CT 07/02/2016 >> mucosal thickening within the left maxillary sinus. However the remainder of the paranasal sinuses are well pneumatized and no air-fluid level is seen. The nasal turbinates are slightly prominent somewhat compromising the nasal airway - Rx for gerd 07/01/16  To 07/30/2016 no better at all > added h1 x one hour prior to hs and trial of dymista  - singulair 08/07/2016 > improved 08/21/2016 despite d/c dymista so rec she leave  off dymista  - 11/20/2016 rec try off singulair p one more month rx to see what if any symptoms recur and restart if they do  Clearly the singulair did not help her breeathing/ cp syndrome but if needed for UACS it's fine to continue it indefinitely  I had an extended discussion with the patient reviewing all relevant studies completed to date and  lasting 15 to 20 minutes of a 25 minute visit    Each maintenance medication was reviewed in detail including most importantly the difference between maintenance and prns and under what circumstances the prns are to be triggered using an action plan format that is not reflected in the computer generated alphabetically organized AVS.    Please see AVS for unique instructions that I personally wrote and verbalized to the the pt in detail and then reviewed with pt  by my nurse highlighting any  changes in therapy recommended at today's visit to their plan of care.

## 2016-11-22 NOTE — Assessment & Plan Note (Addendum)
See CPST > rc for  singulair 08/07/16  11/20/2016  After extensive coaching HFA effectiveness =    75% try symb 80 x 2 pffs before ex but d/c if not helping as this would strongly indicate EIA has nothing to do with her symptoms   I do note that she take toprol 50 mg bid and might suggest that be changed over to bisoprolol if not responding to symbicort though doubt this low a dose would interfere with her resp to symbicort

## 2016-11-22 NOTE — Assessment & Plan Note (Signed)
PFTs 05/28/16  Abn f/v loop with ERV 51  58/57  Corrected to 78%  - Spirometry 07/01/2016   No airflow obst  - CPST 08/06/16  Exercise testing with gas exchange demonstrates a mild-to-moderate functional impairment when compared to matched sedentary norms. Patient appears to likely have mild circulatory limitation indicated with low VO2WorkSLope and low-flat O2 pulse (suggesting possible ischemia, however ECG within normal limits). However, at peak exercise, patient appears primarily limited due to exercise-induced bronchospasm with mild-moderate reduction in FEV1 for up to 15 minutes post-exercise   rec 08/07/2016  : singulair trial x 2 weeks then ov > seen 11/20/2016 and no better - 11/20/2016 rec symb 80 x 15 min before ex and if not better return to cards   The flat 02 pulse together with a reproducible/ none variable complaint of chest burning with ex while on good GERD regimen strongly suggests ischemia to me so if not improved with rx for EIA she needs re-eval for IHD

## 2016-11-24 ENCOUNTER — Other Ambulatory Visit: Payer: Self-pay | Admitting: Emergency Medicine

## 2016-11-24 MED ORDER — LEVOTHYROXINE SODIUM 75 MCG PO TABS: 75.0000 ug | ORAL_TABLET | Freq: Every day | ORAL | 3 refills | Status: DC

## 2016-12-10 NOTE — Progress Notes (Signed)
Medicare Annual Preventive Care Visit  (initial annual wellness or annual wellness exam)  Concerns and/or follow up today:  Tracey Giles is a pleasant 66 yo with a PMH significant for EIA, AR, GERD, Hypothyroidism, HLD, palpitations, meniere's and vit d def here for her welcome to medicare exam. Due for labs, hep c, prevnar, dexa. Pulmonologist started Singulair and Symbicort before exercise. She has not noticed any improvement on the singulair and feels that it makes her feel tired and agitated when she takes it. She wishes to stop it. Has not tried the inhaler.  Her acid reflux has been well controlled and she has no known hx of ulcers, bleeding, hh, erosions or barettes. Not taking pepcid. Bp a little high on arrival. But was lower on recheck and she did not take her BP med this morning. No CP, SOB, DOE, weakanes, swelling. Does not take her vit D on a regular basis. She has had some R ant neck pain for several months and feels this area is fuller then in the past - has always had "fat" in this area, but seems bigger. No dysphagia, fevers, Malaise or weight loss.  ROS: negative for report of fevers, unintentional weight loss, vision changes, vision loss, hearing loss or change, chest pain, sob, hemoptysis, melena, hematochezia, hematuria, genital discharge or lesions, falls, bleeding or bruising, loc, thoughts of suicide or self harm, memory loss  1.) Patient-completed health risk assessment  - completed and reviewed, see scanned documentation  2.) Review of Medical History: -PMH, PSH, Family History and current specialty and care providers reviewed and updated and listed below  - see scanned in document in chart and below  Past Medical History:  Diagnosis Date  . Essential hypertension   . History of dermatitis   . History of migraine headaches   . Hyperlipemia 05/24/2016  . Hypothyroidism   . MVP (mitral valve prolapse)    Reported history  . Palpitations    Rare PVCs by  Holter monitor April 2011   . Sleep apnea    Reportedly mild  . Vitamin D insufficiency 05/24/2016    Past Surgical History:  Procedure Laterality Date  . APPENDECTOMY    . BREAST LUMPECTOMY     Benign    Social History   Social History  . Marital status: Married    Spouse name: N/A  . Number of children: N/A  . Years of education: N/A   Occupational History  . [employer]   Social History Main Topics  . Smoking status: Never Smoker  . Smokeless tobacco: Not on file  . Alcohol use No  . Drug use: No  . Sexual activity: Yes   Other Topics Concern  . Not on file   Social History Narrative   Work or School: retired - used to work for [employer] Situation: lives with husband      Spiritual Beliefs: Christian      Lifestyle: no regular exercise; diet is healthy          Family History  Problem Relation Age of Onset  . Melanoma Father   . Stroke Mother   . Asthma Mother   . Stroke Maternal Grandfather   . Asthma Brother     Current Outpatient Prescriptions on File Prior to Visit  Medication Sig Dispense Refill  . Azelastine-Fluticasone (DYMISTA) 137-50 MCG/ACT SUSP Place 2 puffs into the nose daily. 1 Bottle 0  . budesonide-formoterol (SYMBICORT) 80-4.5 MCG/ACT inhaler  2 puffs at least  15 min before exercise 1 Inhaler 11  . chlorpheniramine (CHLOR-TRIMETON) 4 MG tablet Take 4 mg by mouth every 4 (four) hours as needed for allergies.    . famotidine (PEPCID) 20 MG tablet One at bedtime 30 tablet 11  . levothyroxine (SYNTHROID, LEVOTHROID) 75 MCG tablet Take 1 tablet (75 mcg total) by mouth daily. 90 tablet 3  . LORazepam (ATIVAN) 0.5 MG tablet Use 1/2 to 1 tablet as needed for panic attacks/anxiety 30 tablet 0  . metoprolol succinate (TOPROL-XL) 100 MG 24 hr tablet Take one half twice daily    . omeprazole (PRILOSEC) 40 MG capsule Take 1 capsule (40 mg total) by mouth daily. 30 capsule 6   No current facility-administered medications on  file prior to visit.      3.) Review of functional ability and level of safety:  Any difficulty hearing?  See scanned documentation  History of falling?  See scanned documentation  Any trouble with IADLs - using a phone, using transportation, grocery shopping, preparing meals, doing housework, doing laundry, taking medications and managing money?  See scanned documentation  Advance Directives?  Discussed briefly and offered more resources and detailed discussion with our trained staff.   See summary of recommendations in Patient Instructions below.  4.) Physical Exam Vitals:   12/11/16 0802  BP: 128/84  Pulse: 60  Temp: 97.5 F (36.4 C)   Estimated body mass index is 23.1 kg/m as calculated from the following:   Height as of this encounter: 5\' 8"  (1.727 m).   Weight as of this encounter: 151 lb 14.4 oz (68.9 kg).  EKG (optional): deferred  GENERAL: vitals reviewed and listed below, alert, oriented, appears well hydrated and in no acute distress  HEENT: head atraumatic, PERRLA, normal appearance of eyes, ears, nose and mouth. moist mucus membranes. Visual acuity grossly intact.  NECK: supple, diffuse fullness ant neck  LUNGS: clear to auscultation bilaterally, no rales, rhonchi or wheeze  CV: HRRR, no peripheral edema or cyanosis, normal pedal pulses  ABDOMEN: bowel sounds normal, soft, non tender to palpation, no masses, no rebound or guarding  SKIN: no rash or abnormal lesions, declined full skin exam  GU/BREAST: declined  MS: normal gait, moves all extremities normally  NEURO: normal gait, speech and thought processing grossly intact, muscle tone grossly intact throughout  Cognitive function grossly intact  See patient instructions for recommendations.  Education and counseling regarding the above review of health provided with a plan for the following: -see scanned patient completed form for further details -fall prevention strategies discussed   -healthy lifestyle discussed -importance and resources for completing advanced directives discussed -see patient instructions below for any other recommendations provided  4)The following written screening schedule of preventive measures were reviewed with assessment and plan made per below, orders and patient instructions:           Alcohol screening done     Obesity Screening and counseling done     STI screening (Hep C if born [birth year]) offered and per pt wishes - hep c screening     Tobacco Screening done       Pneumococcal (PPSV23 -one dose after 64, one before if risk factors), influenza yearly and hepatitis B vaccines (if high risk - end stage renal disease, IV drugs, homosexual men, live in home for mentally retarded, hemophilia receiving factors) ASSESSMENT/PLAN: prevnar 13 today      Screening mammograph (yearly if >40) ASSESSMENT/PLAN: 10/2016  Screening Pap smear/pelvic exam (q2 years) ASSESSMENT/PLAN: done 05/2014, done and normal      Colorectal cancer screening (FOBT yearly or flex sig q4y or colonoscopy q10y or barium enema q4y) ASSESSMENT/PLAN: done 2008, she reports this was normal      Diabetes outpatient self-management training services ASSESSMENT/PLAN: utd or done      Bone mass measurements(covered q2y if indicated - estrogen def, osteoporosis, hyperparathyroid, vertebral abnormalities, osteoporosis or steroids) ASSESSMENT/PLAN: advised assistant to order dexa, advised vit d3 and wt bearing exercise      Screening for glaucoma(q1y if high risk - diabetes, FH, AA and > 50 or hispanic and > 65) ASSESSMENT/PLAN: reports done in august      Medical nutritional therapy for individuals with diabetes or renal disease ASSESSMENT/PLAN: see orders      Cardiovascular screening blood tests (lipids q5y) ASSESSMENT/PLAN: see orders and labs      Diabetes screening tests ASSESSMENT/PLAN: see orders and labs   7.) Summary:   Welcome to Medicare preventive  visit -risk factors and conditions per above assessment were discussed and treatment, recommendations and referrals were offered per documentation above and orders and patient instructions.Exercise-induced asthma -dexa -vit d advised -labs -advanced directives discussed briefly, she did not have time for length visit today and plans to discuss with Darl Pikes at her next preventive visit -healthy lifestyle advised  Vitamin D insufficiency -advised to take meds daily  Hyperlipidemia, unspecified hyperlipidemia type - Plan: Lipid Panel -lifestyle recs  Palpitations -stable  Gastroesophageal reflux disease without esophagitis -decrease ppi -trial pepcid but if does not help asthma do not take  Hypothyroidism, unspecified type - Plan: TSH -labs  Essential hypertension - Plan: Basic metabolic panel, CBC -cont current med -lifestyle recs  Estrogen deficiency -assistant to order dexa  Neck mass - Plan: US Soft Tissue Head/Neck  Patient Instructions  BEFORE YOU LEAVE: -prevnar 13 -please order dexa from [facility] for estrogen deficiency -labs -follow up: 1) follow up with Dr. Selena Batten in 3 months 2) Medicare wellness visit with Darl Pikes in 12 months  Vit D3 [phone] IU daily.  We placed a referral for you as discussed for the bone density and for the thyroid ultrasound. It usually takes about 1-2 weeks to process and schedule this referral. If you have not heard from [place] regarding this appointment in 2 weeks please contact our office.  Stop the Singulair. Try the inhaler before exercise.  Decrease the prilosec (omeprazole) to 20mg  daily. Try the pepcid daily as well for 4 weeks to see if this helps your asthma - if not, don't take the pepcid.  We have ordered labs or studies at this visit. It can take up to 1-2 weeks for results and processing. IF results require follow up or explanation, we will call you with instructions. Clinically stable results will be released to your [facility]. If you  have not heard from [place] or cannot find your results in [facility] in 2 weeks please contact our office at [phone].  If you are not yet signed up for [facility], please consider signing up.  We recommend the following healthy lifestyle for LIFE: 1) Small portions.   Tip: eat off of a salad plate instead of a dinner plate.  Tip: if you need more or a snack choose fruits, veggies and/or a handful of nuts or seeds.  2) Eat a healthy clean diet.  * Tip: Avoid (less then 1 serving per week): processed foods, sweets, sweetened drinks, white starches (rice, flour, bread, potatoes, pasta, etc),  red meat, fast foods, butter  *Tip: CHOOSE instead   * 5-9 servings per day of fresh or frozen fruits and vegetables (but not corn, potatoes, bananas, canned or dried fruit)   *nuts and seeds, beans   *olives and olive oil   *small portions of lean meats such as fish and white chicken    *small portions of whole grains  3)Get at least 150 minutes of sweaty aerobic exercise per week.  4)Reduce stress - consider counseling, meditation and relaxation to balance other aspects of your life.           Kriste BasqueKIM, HANNAH R., DO

## 2016-12-11 ENCOUNTER — Telehealth: Payer: Self-pay | Admitting: *Deleted

## 2016-12-11 ENCOUNTER — Ambulatory Visit (INDEPENDENT_AMBULATORY_CARE_PROVIDER_SITE_OTHER): Payer: Medicare Other | Admitting: Family Medicine

## 2016-12-11 ENCOUNTER — Encounter: Payer: Self-pay | Admitting: Family Medicine

## 2016-12-11 VITALS — BP 128/84 | HR 60 | Temp 97.5°F | Ht 68.0 in | Wt 151.9 lb

## 2016-12-11 DIAGNOSIS — E559 Vitamin D deficiency, unspecified: Secondary | ICD-10-CM | POA: Diagnosis not present

## 2016-12-11 DIAGNOSIS — J45990 Exercise induced bronchospasm: Secondary | ICD-10-CM | POA: Diagnosis not present

## 2016-12-11 DIAGNOSIS — E785 Hyperlipidemia, unspecified: Secondary | ICD-10-CM

## 2016-12-11 DIAGNOSIS — Z0000 Encounter for general adult medical examination without abnormal findings: Secondary | ICD-10-CM

## 2016-12-11 DIAGNOSIS — Z23 Encounter for immunization: Secondary | ICD-10-CM

## 2016-12-11 DIAGNOSIS — I10 Essential (primary) hypertension: Secondary | ICD-10-CM

## 2016-12-11 DIAGNOSIS — R002 Palpitations: Secondary | ICD-10-CM

## 2016-12-11 DIAGNOSIS — K219 Gastro-esophageal reflux disease without esophagitis: Secondary | ICD-10-CM

## 2016-12-11 DIAGNOSIS — E2839 Other primary ovarian failure: Secondary | ICD-10-CM

## 2016-12-11 DIAGNOSIS — E039 Hypothyroidism, unspecified: Secondary | ICD-10-CM

## 2016-12-11 DIAGNOSIS — R221 Localized swelling, mass and lump, neck: Secondary | ICD-10-CM

## 2016-12-11 LAB — CBC
HCT: 42.1 % (ref 36.0–46.0)
Hemoglobin: 14.2 g/dL (ref 12.0–15.0)
MCHC: 33.8 g/dL (ref 30.0–36.0)
MCV: 89.1 fl (ref 78.0–100.0)
Platelets: 300 10*3/uL (ref 150.0–400.0)
RBC: 4.72 Mil/uL (ref 3.87–5.11)
RDW: 13.2 % (ref 11.5–15.5)
WBC: 5.8 10*3/uL (ref 4.0–10.5)

## 2016-12-11 LAB — BASIC METABOLIC PANEL
BUN: 15 mg/dL (ref 6–23)
CALCIUM: 9.4 mg/dL (ref 8.4–10.5)
CO2: 28 meq/L (ref 19–32)
Chloride: 106 mEq/L (ref 96–112)
Creatinine, Ser: 0.8 mg/dL (ref 0.40–1.20)
GFR: 76.44 mL/min (ref >60.00)
GLUCOSE: 94 mg/dL (ref 70–99)
Potassium: 4.4 mEq/L (ref 3.5–5.1)
Sodium: 141 mEq/L (ref 135–145)

## 2016-12-11 LAB — LIPID PANEL
CHOLESTEROL: 178 mg/dL (ref 0–200)
HDL: 50.2 mg/dL (ref >39.00)
LDL Cholesterol: 106 mg/dL — ABNORMAL HIGH (ref 0–99)
NonHDL: 128.03
TRIGLYCERIDES: 112 mg/dL (ref 0.0–149.0)
Total CHOL/HDL Ratio: 4
VLDL: 22.4 mg/dL (ref 0.0–40.0)

## 2016-12-11 LAB — TSH: TSH: 2.56 u[IU]/mL (ref 0.35–4.50)

## 2016-12-11 NOTE — Telephone Encounter (Signed)
Per Dr Selena BattenKim a bone density was ordered for estrogen deficiency and [facility] [phone] and the pt is aware someone will call her with an appt.

## 2016-12-11 NOTE — Progress Notes (Signed)
Pre visit review using our clinic review tool, if applicable. No additional management support is needed unless otherwise documented below in the visit note. 

## 2016-12-11 NOTE — Patient Instructions (Addendum)
BEFORE YOU LEAVE: -prevnar 13 -please order dexa from [facility] for estrogen deficiency -labs -follow up: 1) follow up with Dr. Selena BattenKim in 3 months 2) Medicare wellness visit with Darl PikesSusan in 12 months  Vit D3 [phone] IU daily.  We placed a referral for you as discussed for the bone density and for the thyroid ultrasound. It usually takes about 1-2 weeks to process and schedule this referral. If you have not heard from us regarding this appointment in 2 weeks please contact our office.  Stop the Singulair. Try the inhaler before exercise.  Decrease the prilosec (omeprazole) to 20mg  daily. Try the pepcid daily as well for 4 weeks to see if this helps your asthma - if not, don't take the pepcid.  We have ordered labs or studies at this visit. It can take up to 1-2 weeks for results and processing. IF results require follow up or explanation, we will call you with instructions. Clinically stable results will be released to your [facility]. If you have not heard from us or cannot find your results in [facility] in 2 weeks please contact our office at [phone].  If you are not yet signed up for [facility], please consider signing up.  We recommend the following healthy lifestyle for LIFE: 1) Small portions.   Tip: eat off of a salad plate instead of a dinner plate.  Tip: if you need more or a snack choose fruits, veggies and/or a handful of nuts or seeds.  2) Eat a healthy clean diet.  * Tip: Avoid (less then 1 serving per week): processed foods, sweets, sweetened drinks, white starches (rice, flour, bread, potatoes, pasta, etc), red meat, fast foods, butter  *Tip: CHOOSE instead   * 5-9 servings per day of fresh or frozen fruits and vegetables (but not corn, potatoes, bananas, canned or dried fruit)   *nuts and seeds, beans   *olives and olive oil   *small portions of lean meats such as fish and white chicken    *small portions of whole grains  3)Get at least 150 minutes of sweaty aerobic exercise per  week.  4)Reduce stress - consider counseling, meditation and relaxation to balance other aspects of your life.

## 2016-12-11 NOTE — Addendum Note (Signed)
Addended by: Johnella MoloneyFUNDERBURK, JO A on: 12/11/2016 09:20 AM   Modules accepted: Orders

## 2016-12-16 ENCOUNTER — Other Ambulatory Visit: Payer: Medicare Other

## 2016-12-18 LAB — HM DEXA SCAN

## 2016-12-24 ENCOUNTER — Encounter: Payer: Self-pay | Admitting: Family Medicine

## 2016-12-24 ENCOUNTER — Ambulatory Visit
Admission: RE | Admit: 2016-12-24 | Discharge: 2016-12-24 | Disposition: A | Discharge status: Home / Self Care | Payer: Medicare Other | Source: Ambulatory Visit | Attending: Family Medicine | Admitting: Family Medicine

## 2016-12-24 DIAGNOSIS — R221 Localized swelling, mass and lump, neck: Secondary | ICD-10-CM

## 2016-12-25 NOTE — Telephone Encounter (Signed)
Pt is returning dr kim call °

## 2016-12-28 ENCOUNTER — Telehealth: Payer: Self-pay | Admitting: Family Medicine

## 2016-12-28 ENCOUNTER — Other Ambulatory Visit: Payer: Self-pay | Admitting: *Deleted

## 2016-12-28 DIAGNOSIS — E041 Nontoxic single thyroid nodule: Secondary | ICD-10-CM

## 2016-12-28 NOTE — Telephone Encounter (Signed)
See results note. 

## 2016-12-28 NOTE — Telephone Encounter (Signed)
Tracey Giles pt would like to know what the next steps is for her.  Pt stated that Dr. Selena BattenKim had called her but she wasn't available at the time.

## 2016-12-29 ENCOUNTER — Ambulatory Visit (INDEPENDENT_AMBULATORY_CARE_PROVIDER_SITE_OTHER): Payer: Medicare Other | Admitting: Endocrinology

## 2016-12-29 ENCOUNTER — Encounter: Payer: Self-pay | Admitting: Endocrinology

## 2016-12-29 VITALS — BP 122/68 | HR 65 | Ht 68.0 in | Wt 153.0 lb

## 2016-12-29 DIAGNOSIS — E042 Nontoxic multinodular goiter: Secondary | ICD-10-CM | POA: Diagnosis not present

## 2016-12-29 NOTE — Progress Notes (Addendum)
Patient ID: Tracey Giles, female   DOB: 1950/12/12, 66 y.o.   MRN: [id number]          Reason for Appointment: Evaluation of thyroid nodule    History of Present Illness:   The patient had a routine physical exam earlier in 1/18 and her PCP about that her anterior neck was looking full and sent her for an ultrasound.  The patient was also feeling that her neck in the front was puffier than usual She has not had any thyroid ultrasound in the past She also does not complain of any difficulty swallowing, any feeling of a lump in her throat or choking  The thyroid ultrasound showed the following  Right superior 2.4 cm nodule which is solid and hypoechoic without any echogenic foci Also has a 1.5 cm solid hypoechoic nodule in the left mid pole which is taller than wide  Her radiology report indicates needle aspiration and recommended based on the characteristics of the nodules. She is referred here for further management  HYPOTHYROIDISM: She says that she has been on thyroid supplementation for probably 15 years She is not sure how it was diagnosed and does not remember any symptoms associated with this. Initially was given 25 g and this was subsequently increased She has been on 75 g for several years with no recent change in thyroid level Subjectively she feels fairly good   Lab Results  Component Value Date   TSH 2.56 12/11/2016   TSH 2.52 05/22/2016   TSH 1.51 10/23/2015   FREET4 1.2 03/10/2010   FREET4 1.1 11/09/2006     Allergies as of 12/29/2016      Reactions   Cephalexin    Penicillins    REACTION: unspecified   Sulfamethoxazole-trimethoprim    REACTION: unspecified      Medication List       Accurate as of 12/29/16  2:30 PM. Always use your most recent med list.          Azelastine-Fluticasone 137-50 MCG/ACT Susp Commonly known as:  DYMISTA Place 2 puffs into the nose daily.   budesonide-formoterol 80-4.5 MCG/ACT inhaler Commonly known as:   SYMBICORT 2 puffs at least  15 min before exercise   chlorpheniramine 4 MG tablet Commonly known as:  CHLOR-TRIMETON Take 4 mg by mouth every 4 (four) hours as needed for allergies.   famotidine 20 MG tablet Commonly known as:  PEPCID One at bedtime   levothyroxine 75 MCG tablet Commonly known as:  SYNTHROID, LEVOTHROID Take 1 tablet (75 mcg total) by mouth daily.   LORazepam 0.5 MG tablet Commonly known as:  ATIVAN Use 1/2 to 1 tablet as needed for panic attacks/anxiety   metoprolol succinate 100 MG 24 hr tablet Commonly known as:  TOPROL-XL Take one half twice daily   omeprazole 40 MG capsule Commonly known as:  PRILOSEC Take 1 capsule (40 mg total) by mouth daily.   Vitamin D-3 5000 units Tabs Take 1 tablet by mouth daily.       Allergies:  Allergies  Allergen Reactions  . Cephalexin   . Penicillins     REACTION: unspecified  . Sulfamethoxazole-Trimethoprim     REACTION: unspecified    Past Medical History:  Diagnosis Date  . Essential hypertension   . History of dermatitis   . History of migraine headaches   . Hyperlipemia 05/24/2016  . Hypothyroidism   . MVP (mitral valve prolapse)    Reported history  . Palpitations    Rare PVCs by  Holter monitor April 2011   . Sleep apnea    Reportedly mild  . Vitamin D insufficiency 05/24/2016    There is no history of radiation to the neck in childhood  Past Surgical History:  Procedure Laterality Date  . APPENDECTOMY    . BREAST LUMPECTOMY     Benign    Family History  Problem Relation Age of Onset  . Melanoma Father   . Stroke Mother   . Asthma Mother   . Stroke Maternal Grandfather   . Asthma Brother   . Thyroid disease Neg Hx     Social History:  reports that she has never smoked. She has never used smokeless tobacco. She reports that she does not drink alcohol or use drugs.   Review of Systems:  No history of recent weight change  She has had acid reflux which is controlled  She will  sometimes feel a discomfort in the right side of the neck   Examination:   BP 122/68   Pulse 65   Ht 5\' 8"  (1.727 m)   Wt 153 lb (69.4 kg)   SpO2 95%   BMI 23.26 kg/m    General Appearance:  well-looking        Eyes: No  prominence or swelling of the eyes         THYROID: There is no palpable enlargement felt. There is a fullness on swallowing of the right lobe but no distinct nodules felt No stridor  There is a 1 cm mildly tender left submandibular lymph node present No other lymphadenopathy Heart sounds normal Lungs clear Abdomen exam not indicated Reflexes at biceps are brisk.  Extremities: No edema  Assessment/Plan:  Thyroid nodules:  This appears to be an incidental finding without obviously any palpable nodules However she has not had any previous evaluation and she has a 2.4 centimeter nodule on the right which is solid and a taller than wide 1.5 cm nodule on the left Explained the nature of thyroid nodules with the patient and that over 90% of nodules are benign  Discussed that needle aspiration is indicated to further evaluate the nodules and discussed how this procedure is done as well as possible outcomes and use of cytology and Affirma testing  She will be scheduled for needle aspiration under ultrasound.  Lymph node in left neck: Appears to be reactive  HYPOTHYROIDISM: This has been diagnosed in the past but not clear what circumstances were around the time of diagnosis or previous rationale for treatment.  She appears to be euthyroid on 75 g levothyroxine for several years, to be followed by PCP   [facility]UM[facility],Honestii Marton 12/29/2016

## 2016-12-31 ENCOUNTER — Ambulatory Visit
Admission: RE | Admit: 2016-12-31 | Discharge: 2016-12-31 | Disposition: A | Discharge status: Home / Self Care | Payer: Medicare Other | Source: Ambulatory Visit | Attending: Endocrinology | Admitting: Endocrinology

## 2016-12-31 ENCOUNTER — Other Ambulatory Visit: Payer: Self-pay | Admitting: Endocrinology

## 2016-12-31 DIAGNOSIS — E042 Nontoxic multinodular goiter: Secondary | ICD-10-CM

## 2017-01-01 NOTE — Progress Notes (Signed)
Please let patient know that the ultrasound did not show any nodules on this exam and she does not need any further evaluation or follow-up here

## 2017-01-04 ENCOUNTER — Encounter: Payer: Self-pay | Admitting: Family Medicine

## 2017-01-04 DIAGNOSIS — M810 Age-related osteoporosis without current pathological fracture: Secondary | ICD-10-CM | POA: Insufficient documentation

## 2017-01-13 ENCOUNTER — Telehealth: Payer: Self-pay | Admitting: Endocrinology

## 2017-01-13 NOTE — Telephone Encounter (Signed)
Patient is returning your call.  

## 2017-01-14 ENCOUNTER — Encounter: Payer: Self-pay | Admitting: Family Medicine

## 2017-01-14 ENCOUNTER — Ambulatory Visit (INDEPENDENT_AMBULATORY_CARE_PROVIDER_SITE_OTHER): Payer: Medicare Other | Admitting: Family Medicine

## 2017-01-14 VITALS — BP 122/80 | HR 64 | Temp 98.1°F | Ht 68.0 in | Wt 154.2 lb

## 2017-01-14 DIAGNOSIS — E559 Vitamin D deficiency, unspecified: Secondary | ICD-10-CM | POA: Diagnosis not present

## 2017-01-14 DIAGNOSIS — K219 Gastro-esophageal reflux disease without esophagitis: Secondary | ICD-10-CM | POA: Diagnosis not present

## 2017-01-14 DIAGNOSIS — E039 Hypothyroidism, unspecified: Secondary | ICD-10-CM | POA: Diagnosis not present

## 2017-01-14 DIAGNOSIS — M810 Age-related osteoporosis without current pathological fracture: Secondary | ICD-10-CM

## 2017-01-14 NOTE — Patient Instructions (Signed)
BEFORE YOU LEAVE: -cancel follow up in April -follow up: schedule follow up in May  Vit D3 1000 IU daily (sam's club brand)  Daily weight bearing exercise  Healthy diet   Osteoporosis Osteoporosis is the thinning and loss of density in the bones. Osteoporosis makes the bones more brittle, fragile, and likely to break (fracture). Over time, osteoporosis can cause the bones to become so weak that they fracture after a simple fall. The bones most likely to fracture are the bones in the hip, wrist, and spine. What are the causes? The exact cause is not known. What increases the risk? Anyone can develop osteoporosis. You may be at greater risk if you have a family history of the condition or have poor nutrition. You may also have a higher risk if you are:  Female.  66 years old or older.  A smoker.  Not physically active.  White or Asian.  Slender. What are the signs or symptoms? A fracture might be the first sign of the disease, especially if it results from a fall or injury that would not usually cause a bone to break. Other signs and symptoms include:  Low back and neck pain.  Stooped posture.  Height loss. How is this diagnosed? To make a diagnosis, your health care provider may:  Take a medical history.  Perform a physical exam.  Order tests, such as:  A bone mineral density test.  A dual-energy X-ray absorptiometry test. How is this treated? The goal of osteoporosis treatment is to strengthen your bones to reduce your risk of a fracture. Treatment may involve:  Making lifestyle changes, such as:  Eating a diet rich in calcium.  Doing weight-bearing and muscle-strengthening exercises.  Stopping tobacco use.  Limiting alcohol intake.  Taking medicine to slow the process of bone loss or to increase bone density.  Taking vitamin D Follow these instructions at home:  Include calcium and vitamin D in your diet. Calcium is important for bone health, and  vitamin D helps the body absorb calcium.  Perform weight-bearing and muscle-strengthening exercises as directed by your health care provider.  Do not use any tobacco products, including cigarettes, chewing tobacco, and electronic cigarettes. If you need help quitting, ask your health care provider.  Limit your alcohol intake.  Take medicines only as directed by your health care provider.  Keep all follow-up visits as directed by your health care provider. This is important.  Take precautions at home to lower your risk of falling, such as:  Keeping rooms well lit and clutter free.  Installing safety rails on stairs.  Using rubber mats in the bathroom and other areas that are often wet or slippery. Get help right away if: You fall or injure yourself. This information is not intended to replace advice given to you by your health care provider. Make sure you discuss any questions you have with your health care provider. Document Released: 09/02/2005 Document Revised: 04/27/2016 Document Reviewed: 05/03/2014 Elsevier Interactive Patient Education  2017 [employer]

## 2017-01-14 NOTE — Progress Notes (Signed)
HPI:  Tracey FischerCatherine M Giles is a pleasant 66 yo with a PMH significant for HT, Hypothyroidism, Hyperlipidemia, recent dx osteoporosis here to discuss treatment options for osteoporosis. DEXA report reviewed with patient. She is not taking vit D on a regular basis. She eats a healthy diet. She does not smoke. She does not have a history of fracture of family hx. She take PPI. Had her thyroid evaluation and nodules had resolved on repeat imaging. Feels fine. No complaints or symptoms.  ROS: See pertinent positives and negatives per HPI.  Past Medical History:  Diagnosis Date  . Essential hypertension   . History of dermatitis   . History of migraine headaches   . Hyperlipemia 05/24/2016  . Hypothyroidism   . MVP (mitral valve prolapse)    Reported history  . Palpitations    Rare PVCs by Holter monitor April 2011   . Sleep apnea    Reportedly mild  . Vitamin D insufficiency 05/24/2016    Past Surgical History:  Procedure Laterality Date  . APPENDECTOMY    . BREAST LUMPECTOMY     Benign    Family History  Problem Relation Age of Onset  . Melanoma Father   . Stroke Mother   . Asthma Mother   . Stroke Maternal Grandfather   . Asthma Brother   . Thyroid disease Neg Hx     Social History   Social History  . Marital status: Married    Spouse name: N/A  . Number of children: N/A  . Years of education: N/A   Occupational History  . [employer]   Social History Main Topics  . Smoking status: Never Smoker  . Smokeless tobacco: Never Used  . Alcohol use No  . Drug use: No  . Sexual activity: Yes   Other Topics Concern  . 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  Azelastine-Fluticasone (DYMISTA) 137-50 MCG/ACT SUSP, Place 2 puffs  into the nose daily., Disp: 1 Bottle, Rfl: 0 .  budesonide-formoterol (SYMBICORT) 80-4.5 MCG/ACT inhaler, 2 puffs at least  15 min before exercise, Disp: 1 Inhaler, Rfl: 11 .  chlorpheniramine (CHLOR-TRIMETON) 4 MG tablet, Take 4 mg by mouth every 4 (four) hours as needed for allergies., Disp: , Rfl:  .  Cholecalciferol (VITAMIN D3 PO), Take 1,000 Units by mouth daily., Disp: , Rfl:  .  famotidine (PEPCID) 20 MG tablet, One at bedtime, Disp: 30 tablet, Rfl: 11 .  levothyroxine (SYNTHROID, LEVOTHROID) 75 MCG tablet, Take 1 tablet (75 mcg total) by mouth daily., Disp: 90 tablet, Rfl: 3 .  LORazepam (ATIVAN) 0.5 MG tablet, Use 1/2 to 1 tablet as needed for panic attacks/anxiety, Disp: 30 tablet, Rfl: 0 .  metoprolol succinate (TOPROL-XL) 100 MG 24 hr tablet, Take one half twice daily, Disp: , Rfl:  .  omeprazole (PRILOSEC) 40 MG capsule, Take 1 capsule (40 mg total) by mouth daily., Disp: 30 capsule, Rfl: 6  EXAM:  Vitals:   01/14/17 0936  BP: 122/80  Pulse: 64  Temp: 98.1 F (36.7 C)    Body mass index is 23.45 kg/m.  GENERAL: vitals reviewed and listed above, alert, oriented, appears well hydrated and in  no acute distress  HEENT: atraumatic, conjunttiva clear, no obvious abnormalities on inspection of external nose and ears  NECK: no obvious masses on inspection  LUNGS: clear to auscultation bilaterally, no wheezes, rales or rhonchi, good air movement  CV: HRRR, no peripheral edema  MS: moves all extremities without noticeable abnormality  PSYCH: pleasant and cooperative, no obvious depression or anxiety  ASSESSMENT AND PLAN:  Discussed the following assessment and plan:  Osteoporosis, unspecified osteoporosis type, unspecified pathological fracture presence  Vitamin D insufficiency  Gastroesophageal reflux disease without esophagitis  Hypothyroidism, unspecified type  -discussed risks, implications, testing, treatment osteoporosis -reviewed DEXA report with her -she  wants to avoid taking medications -she agrees to regular weight bearing exercise and Vit D supplementation -advised repeat dexa in 2 years -Patient advised to return or notify a doctor immediately if symptoms worsen or persist or new concerns arise.  Patient Instructions  BEFORE YOU LEAVE: -cancel follow up in April -follow up: schedule follow up in May  Vit D3 1000 IU daily (sam's club brand)  Daily weight bearing exercise  Healthy diet   Osteoporosis Osteoporosis is the thinning and loss of density in the bones. Osteoporosis makes the bones more brittle, fragile, and likely to break (fracture). Over time, osteoporosis can cause the bones to become so weak that they fracture after a simple fall. The bones most likely to fracture are the bones in the hip, wrist, and spine. What are the causes? The exact cause is not known. What increases the risk? Anyone can develop osteoporosis. You may be at greater risk if you have a family history of the condition or have poor nutrition. You may also have a higher risk if you are:  Female.  42 years old or older.  A smoker.  Not physically active.  White or Asian.  Slender. What are the signs or symptoms? A fracture might be the first sign of the disease, especially if it results from a fall or injury that would not usually cause a bone to break. Other signs and symptoms include:  Low back and neck pain.  Stooped posture.  Height loss. How is this diagnosed? To make a diagnosis, your health care provider may:  Take a medical history.  Perform a physical exam.  Order tests, such as:  A bone mineral density test.  A dual-energy X-ray absorptiometry test. How is this treated? The goal of osteoporosis treatment is to strengthen your bones to reduce your risk of a fracture. Treatment may involve:  Making lifestyle changes, such as:  Eating a diet rich in calcium.  Doing weight-bearing and muscle-strengthening  exercises.  Stopping tobacco use.  Limiting alcohol intake.  Taking medicine to slow the process of bone loss or to increase bone density.  Taking vitamin D Follow these instructions at home:  Include calcium and vitamin D in your diet. Calcium is important for bone health, and vitamin D helps the body absorb calcium.  Perform weight-bearing and muscle-strengthening exercises as directed by your health care provider.  Do not use any tobacco products, including cigarettes, chewing tobacco, and electronic cigarettes. If you need help quitting, ask your health care provider.  Limit your alcohol intake.  Take medicines only as directed by your health care provider.  Keep all follow-up visits as directed by your health care provider. This is important.  Take precautions at home to lower your risk of falling, such as:  Keeping rooms well lit and clutter free.  Installing safety rails on stairs.  Using rubber mats in the bathroom and other areas that are often wet or slippery. Get help right away if: You fall or injure yourself. This information is not intended to replace advice given to you by your health care provider. Make sure you discuss any questions you have with your health care provider. Document Released: 09/02/2005 Document Revised: 04/27/2016 Document Reviewed: 05/03/2014 Elsevier Interactive Patient Education  [address].     [place], Dahlia Client R., DO

## 2017-01-14 NOTE — Progress Notes (Signed)
Pre visit review using our clinic review tool, if applicable. No additional management support is needed unless otherwise documented below in the visit note. 

## 2017-01-18 NOTE — Telephone Encounter (Signed)
I contacted the patient and advised of message from 01/18/2017. Please let patient know that the ultrasound did not show any nodules on this exam and she does not need any further evaluation or follow-up here  Patient voiced understanding and had no further questions at this time.

## 2017-01-21 ENCOUNTER — Encounter (HOSPITAL_COMMUNITY): Payer: Self-pay | Admitting: *Deleted

## 2017-03-12 ENCOUNTER — Ambulatory Visit: Payer: Medicare Other | Admitting: Family Medicine

## 2017-04-13 ENCOUNTER — Ambulatory Visit (INDEPENDENT_AMBULATORY_CARE_PROVIDER_SITE_OTHER): Payer: Medicare Other | Admitting: Family Medicine

## 2017-04-13 VITALS — BP 128/80 | HR 62 | Temp 98.2°F | Ht 68.0 in | Wt 152.2 lb

## 2017-04-13 DIAGNOSIS — E785 Hyperlipidemia, unspecified: Secondary | ICD-10-CM

## 2017-04-13 DIAGNOSIS — M810 Age-related osteoporosis without current pathological fracture: Secondary | ICD-10-CM

## 2017-04-13 DIAGNOSIS — R002 Palpitations: Secondary | ICD-10-CM

## 2017-04-13 DIAGNOSIS — M4004 Postural kyphosis, thoracic region: Secondary | ICD-10-CM

## 2017-04-13 DIAGNOSIS — M546 Pain in thoracic spine: Secondary | ICD-10-CM

## 2017-04-13 DIAGNOSIS — E039 Hypothyroidism, unspecified: Secondary | ICD-10-CM | POA: Diagnosis not present

## 2017-04-13 NOTE — Progress Notes (Signed)
Pre visit review using our clinic review tool, if applicable. No additional management support is needed unless otherwise documented below in the visit note. 

## 2017-04-13 NOTE — Patient Instructions (Signed)
BEFORE YOU LEAVE: -follow up: 1 month regarding back pain -upper back exercises  FOR THE BACK PAIN: -postural exercise we discussed -Home exercises for the upper back (provided) 4 days per week -topical menthol (tiger balm) or capsaicin sports creams as needed for pain (available over the counter) -heat as needed for pain -stay active   We recommend the following healthy lifestyle for LIFE: 1) Small portions.   Tip: eat off of a salad plate instead of a dinner plate.  Tip: if you need more or a snack choose fruits, veggies and/or a handful of nuts or seeds.  2) Eat a healthy clean diet.  * Tip: Avoid (less then 1 serving per week): processed foods, sweets, sweetened drinks, white starches (rice, flour, bread, potatoes, pasta, etc), red meat, fast foods, butter  *Tip: CHOOSE instead   * 5-9 servings per day of fresh or frozen fruits and vegetables (but not corn, potatoes, bananas, canned or dried fruit)   *nuts and seeds, beans   *olives and olive oil   *small portions of lean meats such as fish and white chicken    *small portions of whole grains  3)Get at least 150 minutes of sweaty aerobic exercise per week.  4)Reduce stress - consider counseling, meditation and relaxation to balance other aspects of your life.

## 2017-04-13 NOTE — Progress Notes (Signed)
HPI:  Tracey Giles is a pleasant 66 y.o. here for follow up. Chronic medical problems summarized below were reviewed for changes and stability and were updated as needed below. These issues and their treatment remain stable for the most part. Doing well for the most part. New issue of R thoracic back pain. For about 3-4 weeks. Occurs with doing dishes and sometimes with exercise. No radiation, fevers, malaise. Poor posture. Denies CP, SOB, DOE, treatment intolerance or new symptoms.  Osteoporosis: -last DEXA 2018 -she opted for treatment with Vit D and exercise, declined medications  Hypothyroidism: -meds: levothyroxine -s/p eval with endo for goiter  GERD: -uses low does PPI  Allergies/Asthma: -EIB -sees pulmonologist -meds: singulair and symbicort  Palpitations/HTN: -on BB for this -s/p eval in the past -occ uses ativan with panic attack that is triggered by the palpitations  Meniere's: -sees Tracey Giles for management   ROS: See pertinent positives and negatives per HPI.  Past Medical History:  Diagnosis Date  . Essential hypertension   . History of dermatitis   . History of migraine headaches   . Hyperlipemia 05/24/2016  . Hypothyroidism   . MVP (mitral valve prolapse)    Reported history  . Palpitations    Rare PVCs by Holter monitor April 2011   . Sleep apnea    Reportedly mild  . Vitamin D insufficiency 05/24/2016    Past Surgical History:  Procedure Laterality Date  . APPENDECTOMY    . BREAST LUMPECTOMY     Benign    Family History  Problem Relation Age of Onset  . Melanoma Father   . Stroke Mother   . Asthma Mother   . Stroke Maternal Grandfather   . Asthma Brother   . Thyroid disease Neg Hx     Social History   Social History  . Marital status: Married    Spouse name: N/A  . Number of children: N/A  . Years of education: N/A   Occupational History  . [employer]   Social History Main Topics  . Smoking status: Never Smoker  .  Smokeless tobacco: Never Used  . Alcohol use No  . Drug use: No  . Sexual activity: Yes   Other Topics Concern  . 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  Azelastine-Fluticasone (DYMISTA) 137-50 MCG/ACT SUSP, Place 2 puffs into the nose daily., Disp: 1 Bottle, Rfl: 0 .  budesonide-formoterol (SYMBICORT) 80-4.5 MCG/ACT inhaler, 2 puffs at least  15 min before exercise, Disp: 1 Inhaler, Rfl: 11 .  chlorpheniramine (CHLOR-TRIMETON) 4 MG tablet, Take 4 mg by mouth every 4 (four) hours as needed for allergies., Disp: , Rfl:  .  Cholecalciferol (VITAMIN D3 PO), Take 1,000 Units by mouth daily., Disp: , Rfl:  .  famotidine (PEPCID) 20 MG tablet, One at bedtime, Disp: 30 tablet, Rfl: 11 .  levothyroxine (SYNTHROID, LEVOTHROID) 75 MCG tablet, Take 1 tablet (75 mcg total) by mouth daily., Disp: 90 tablet, Rfl: 3 .  LORazepam (ATIVAN) 0.5 MG tablet, Use 1/2 to 1 tablet as needed for panic attacks/anxiety, Disp: 30 tablet, Rfl: 0 .  metoprolol succinate (TOPROL-XL) 100 MG 24 hr tablet,  Take one half twice daily, Disp: , Rfl:  .  omeprazole (PRILOSEC) 40 MG capsule, Take 1 capsule (40 mg total) by mouth daily., Disp: 30 capsule, Rfl: 6  EXAM:  Vitals:   04/13/17 0945  BP: 128/80  Pulse: 62  Temp: 98.2 F (36.8 C)    Body mass index is 23.14 kg/m.  GENERAL: vitals reviewed and listed above, alert, oriented, appears well hydrated and in no acute distress  HEENT: atraumatic, conjunttiva clear, no obvious abnormalities on inspection of external nose and ears  NECK: no obvious masses on inspection  LUNGS: clear to auscultation bilaterally, no wheezes, rales or rhonchi, good air movement  CV: HRRR, no peripheral edema  MS: moves all  extremities without noticeable abnormality, thoracic kyphosis, TTP and tension R paraspinal muscles mid-lower thoracic region, no bony TTP  PSYCH: pleasant and cooperative, no obvious depression or anxiety  ASSESSMENT AND PLAN:  Discussed the following assessment and plan:  Acute right-sided thoracic back pain  Postural kyphosis of thoracic region  Hypothyroidism, unspecified type  Palpitations  Osteoporosis, unspecified osteoporosis type, unspecified pathological fracture presence  Hyperlipidemia, unspecified hyperlipidemia type  -discussed potential etiologies, eval , tx back issues -she opted to try postural exercise (demonstrated), HEP and symptomatic care with OTC options, follow up in 1 month -cont other medications for chronic disease -lifestyle recs -Patient advised to return or notify a doctor immediately if symptoms worsen or persist or new concerns arise.  Patient Instructions  BEFORE YOU LEAVE: -follow up: 1 month regarding back pain -upper back exercises  FOR THE BACK PAIN: -postural exercise we discussed -Home exercises for the upper back (provided) 4 days per week -topical menthol (tiger balm) or capsaicin sports creams as needed for pain (available over the counter) -heat as needed for pain -stay active   We recommend the following healthy lifestyle for LIFE: 1) Small portions.   Tip: eat off of a salad plate instead of a dinner plate.  Tip: if you need more or a snack choose fruits, veggies and/or a handful of nuts or seeds.  2) Eat a healthy clean diet.  * Tip: Avoid (less then 1 serving per week): processed foods, sweets, sweetened drinks, white starches (rice, flour, bread, potatoes, pasta, etc), red meat, fast foods, butter  *Tip: CHOOSE instead   * 5-9 servings per day of fresh or frozen fruits and vegetables (but not corn, potatoes, bananas, canned or dried fruit)   *nuts and seeds, beans   *olives and olive oil   *small portions of lean meats  such as fish and white chicken    *small portions of whole grains  3)Get at least 150 minutes of sweaty aerobic exercise per week.  4)Reduce stress - consider counseling, meditation and relaxation to balance other aspects of your life.     Kriste Basque R., DO

## 2017-04-22 ENCOUNTER — Ambulatory Visit: Payer: Medicare Other | Admitting: Family Medicine

## 2017-05-14 ENCOUNTER — Ambulatory Visit (INDEPENDENT_AMBULATORY_CARE_PROVIDER_SITE_OTHER)
Admission: RE | Admit: 2017-05-14 | Discharge: 2017-05-14 | Disposition: A | Discharge status: Home / Self Care | Payer: Medicare Other | Source: Ambulatory Visit | Attending: Family Medicine | Admitting: Family Medicine

## 2017-05-14 ENCOUNTER — Encounter: Payer: Self-pay | Admitting: Family Medicine

## 2017-05-14 ENCOUNTER — Ambulatory Visit (INDEPENDENT_AMBULATORY_CARE_PROVIDER_SITE_OTHER): Payer: Medicare Other | Admitting: Family Medicine

## 2017-05-14 VITALS — BP 122/80 | HR 70 | Temp 97.9°F | Ht 68.0 in | Wt 153.0 lb

## 2017-05-14 DIAGNOSIS — M546 Pain in thoracic spine: Secondary | ICD-10-CM | POA: Diagnosis not present

## 2017-05-14 DIAGNOSIS — G8929 Other chronic pain: Secondary | ICD-10-CM

## 2017-05-14 MED ORDER — CYCLOBENZAPRINE HCL 5 MG PO TABS: 5.0000 mg | ORAL_TABLET | Freq: Two times a day (BID) | ORAL | 0 refills | Status: DC | PRN

## 2017-05-14 NOTE — Patient Instructions (Signed)
BEFORE YOU LEAVE: -xray sheet -follow up: 4 weeks  Get the xray.  Continue working on posture and the exercises at least 4 days per week.  Can use the muscle relaxer as needed and the other treatments we discussed at the last visit.  Follow up sooner if worsening or new concerns.

## 2017-05-14 NOTE — Progress Notes (Signed)
HPI:  Tracey FischerCatherine M Giles is a pleasant 66 y.o. here for follow up regarding back pain: -started about 7 weeks ago, seen for this about 4 weeks ago -R thoracic when doing dishes -postural exercises, HEP, topical and OTC treatments last visit -reports is about 20% better, still only occurs with doing dishes -dull moderate pain in R>L thoracic paraspinal muscles -no weakness, malaise, fevers  ROS: See pertinent positives and negatives per HPI.  Past Medical History:  Diagnosis Date  . Essential hypertension   . History of dermatitis   . History of migraine headaches   . Hyperlipemia 05/24/2016  . Hypothyroidism   . MVP (mitral valve prolapse)    Reported history  . Palpitations    Rare PVCs by Holter monitor April 2011   . Sleep apnea    Reportedly mild  . Vitamin D insufficiency 05/24/2016    Past Surgical History:  Procedure Laterality Date  . APPENDECTOMY    . BREAST LUMPECTOMY     Benign    Family History  Problem Relation Age of Onset  . Melanoma Father   . Stroke Mother   . Asthma Mother   . Stroke Maternal Grandfather   . Asthma Brother   . Thyroid disease Neg Hx     Social History   Social History  . Marital status: Married    Spouse name: N/A  . Number of children: N/A  . Years of education: N/A   Occupational History  . [employer]   Social History Main Topics  . Smoking status: Never Smoker  . Smokeless tobacco: Never Used  . Alcohol use No  . Drug use: No  . Sexual activity: Yes   Other Topics Concern  . 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  Azelastine-Fluticasone (DYMISTA) 137-50 MCG/ACT SUSP, Place 2 puffs into the nose daily., Disp: 1 Bottle, Rfl: 0 .  budesonide-formoterol (SYMBICORT)  80-4.5 MCG/ACT inhaler, 2 puffs at least  15 min before exercise, Disp: 1 Inhaler, Rfl: 11 .  chlorpheniramine (CHLOR-TRIMETON) 4 MG tablet, Take 4 mg by mouth every 4 (four) hours as needed for allergies., Disp: , Rfl:  .  Cholecalciferol (VITAMIN D3 PO), Take 1,000 Units by mouth daily., Disp: , Rfl:  .  famotidine (PEPCID) 20 MG tablet, One at bedtime, Disp: 30 tablet, Rfl: 11 .  levothyroxine (SYNTHROID, LEVOTHROID) 75 MCG tablet, Take 1 tablet (75 mcg total) by mouth daily., Disp: 90 tablet, Rfl: 3 .  LORazepam (ATIVAN) 0.5 MG tablet, Use 1/2 to 1 tablet as needed for panic attacks/anxiety, Disp: 30 tablet, Rfl: 0 .  metoprolol succinate (TOPROL-XL) 100 MG 24 hr tablet, Take one half twice daily, Disp: , Rfl:  .  omeprazole (PRILOSEC) 40 MG capsule, Take 1 capsule (40 mg total) by mouth daily., Disp: 30 capsule, Rfl: 6 .  cyclobenzaprine (FLEXERIL) 5 MG tablet, Take 1 tablet (5 mg total) by mouth 2 (two) times daily as needed for muscle spasms., Disp: 20 tablet, Rfl: 0  EXAM:  Vitals:   05/14/17 1051  BP: 122/80  Pulse: 70  Temp: 97.9 F (36.6 C)    Body mass index is 23.26 kg/m.  GENERAL:  vitals reviewed and listed above, alert, oriented, appears well hydrated and in no acute distress  HEENT: atraumatic, conjunttiva clear, no obvious abnormalities on inspection of external nose and ears  NECK: no obvious masses on inspection  MS: moves all extremities without noticeable abnormality, TTP thoracic paraspinal muscles reproduces pain, kyphosis thoracic spine, no bony TTP  PSYCH: pleasant and cooperative, no obvious depression or anxiety  ASSESSMENT AND PLAN:  Discussed the following assessment and plan:  Chronic right-sided thoracic back pain - Plan: DG Thoracic Spine W/Swimmers  -we discussed possible serious and likely etiologies, workup and treatment, treatment risks and return precautions -after this discussion, [place] opted for plain films, continue HEP as symptoms  are improving some, muscle relaxer as needed -follow up advised 4 weeks -of course, we advised Ferol  to return or notify a doctor immediately if symptoms worsen or persist or new concerns arise.   Patient Instructions  BEFORE YOU LEAVE: -xray sheet -follow up: 4 weeks  Get the xray.  Continue working on posture and the exercises at least 4 days per week.  Can use the muscle relaxer as needed and the other treatments we discussed at the last visit.  Follow up sooner if worsening or new concerns.    Kriste Basque R., DO

## 2017-06-03 ENCOUNTER — Other Ambulatory Visit: Payer: Self-pay | Admitting: Family Medicine

## 2017-06-03 NOTE — Telephone Encounter (Signed)
Ok to refill...but first can you check with pt on dosing? Looks like in the past  she was taking 1/2 tablet? See also if taking once or twice daily and refill according to how she has been taking it. Thanks.

## 2017-06-04 NOTE — Telephone Encounter (Signed)
I called the pt and she stated she has been taking 1/2 in the morning and 1/2 in the pm.  Rx sent according to this.

## 2017-06-11 ENCOUNTER — Encounter: Payer: Self-pay | Admitting: Family Medicine

## 2017-06-11 ENCOUNTER — Ambulatory Visit (INDEPENDENT_AMBULATORY_CARE_PROVIDER_SITE_OTHER): Payer: Medicare Other | Admitting: Family Medicine

## 2017-06-11 VITALS — BP 120/82 | HR 63 | Temp 98.2°F | Ht 68.0 in | Wt 153.7 lb

## 2017-06-11 DIAGNOSIS — R221 Localized swelling, mass and lump, neck: Secondary | ICD-10-CM

## 2017-06-11 DIAGNOSIS — M542 Cervicalgia: Secondary | ICD-10-CM | POA: Diagnosis not present

## 2017-06-11 DIAGNOSIS — M546 Pain in thoracic spine: Secondary | ICD-10-CM | POA: Diagnosis not present

## 2017-06-11 DIAGNOSIS — M2653 Deviation in opening and closing of the mandible: Secondary | ICD-10-CM

## 2017-06-11 NOTE — Patient Instructions (Signed)
-  We placed a referral for you as discussed to the ENT doctor for evaluation of the neck issues. It usually takes about 1-2 weeks to process and schedule this referral. If you have not heard from us regarding this appointment in 2 weeks please contact our office.  Follow up here after the ENT evaluation.

## 2017-06-11 NOTE — Progress Notes (Signed)
HPI:  Follow up thoracic back pain: -normal plain films -reports this has actually resolved for the most part -occasionally has mild discomfort with certain activities but is symptoms free the majority of the time  New complain of R neck discomfort: -reports has been going on for several months, but forgot to mention it -pain, spasm and swelling in R neck -had root canal in this area last year and saw dentist about this and he said it may or may not be related -hx of thyroid nodule and was referred for biopsy but then was told did not need biopsy and nodules had resolved  ROS: See pertinent positives and negatives per HPI.  Past Medical History:  Diagnosis Date  . Essential hypertension   . History of dermatitis   . History of migraine headaches   . Hyperlipemia 05/24/2016  . Hypothyroidism   . MVP (mitral valve prolapse)    Reported history  . Palpitations    Rare PVCs by Holter monitor April 2011   . Sleep apnea    Reportedly mild  . Vitamin D insufficiency 05/24/2016    Past Surgical History:  Procedure Laterality Date  . APPENDECTOMY    . BREAST LUMPECTOMY     Benign    Family History  Problem Relation Age of Onset  . Melanoma Father   . Stroke Mother   . Asthma Mother   . Stroke Maternal Grandfather   . Asthma Brother   . Thyroid disease Neg Hx     Social History   Social History  . Marital status: Married    Spouse name: N/A  . Number of children: N/A  . Years of education: N/A   Occupational History  . [employer]   Social History Main Topics  . Smoking status: Never Smoker  . Smokeless tobacco: Never Used  . Alcohol use No  . Drug use: No  . Sexual activity: Yes   Other Topics Concern  . 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  Azelastine-Fluticasone (DYMISTA) 137-50 MCG/ACT SUSP, Place 2 puffs into the nose daily., Disp: 1 Bottle, Rfl: 0 .  budesonide-formoterol (SYMBICORT) 80-4.5 MCG/ACT inhaler, 2 puffs at least  15 min before exercise, Disp: 1 Inhaler, Rfl: 11 .  chlorpheniramine (CHLOR-TRIMETON) 4 MG tablet, Take 4 mg by mouth every 4 (four) hours as needed for allergies., Disp: , Rfl:  .  Cholecalciferol (VITAMIN D3 PO), Take 1,000 Units by mouth daily., Disp: , Rfl:  .  cyclobenzaprine (FLEXERIL) 5 MG tablet, Take 1 tablet (5 mg total) by mouth 2 (two) times daily as needed for muscle spasms., Disp: 20 tablet, Rfl: 0 .  famotidine (PEPCID) 20 MG tablet, One at bedtime, Disp: 30 tablet, Rfl: 11 .  levothyroxine (SYNTHROID, LEVOTHROID) 75 MCG tablet, Take 1 tablet (75 mcg total) by mouth daily., Disp: 90 tablet, Rfl: 3 .  LORazepam (ATIVAN) 0.5 MG tablet, Use 1/2 to 1 tablet as needed for panic attacks/anxiety, Disp: 30 tablet, Rfl: 0 .  metoprolol succinate (TOPROL-XL) 100 MG 24 hr tablet, Take one half twice daily, Disp: , Rfl:  .  metoprolol succinate (TOPROL-XL) 100 MG 24 hr tablet, Take 0.5  tablet every morning and 0.5 tablet every evening, Disp: 90 tablet, Rfl: 1 .  omeprazole (PRILOSEC) 40 MG capsule, Take 1 capsule (40 mg total) by mouth daily., Disp: 30 capsule, Rfl: 6  EXAM:  Vitals:   06/11/17 1050  BP: 120/82  Pulse: 63  Temp: 98.2 F (36.8 C)    Body mass index is 23.37 kg/m.  GENERAL: vitals reviewed and listed above, alert, oriented, appears well hydrated and in no acute distress  HEENT: atraumatic, conjunttiva clear, no obvious abnormalities on inspection of external nose and ears, normal inspection ear canals, deviation of jaw to the R with opening - no crepitus or catching/locking  NECK: question of ant upper neck swelling or mass that is NTTP  LUNGS: clear to auscultation bilaterally, no wheezes, rales or rhonchi, good air movement  CV: HRRR, no  peripheral edema  MS: moves all extremities without noticeable abnormality  PSYCH: pleasant and cooperative, no obvious depression or anxiety  ASSESSMENT AND PLAN:  Discussed the following assessment and plan:  Neck mass - Plan: Ambulatory referral to ENT Neck pain - Plan: Ambulatory referral to ENT -odd hx of suspicious R upper thyroid nodule, that had ? Resolved on reeval for biopsy; on exam still seems to have abnormal swelling or mass R> L upper neck (though has sig fat pad here) and she seems to think this is enlarging and sometimes has discomfort -discussed options for further eval including various imaging modalities, ENT eval, etc -opted to have her see ENT for eval and determination of next step -she also has R jaw deviation, so, if no suspicious or concern mass this may be the source of her pain   Thoracic back pain, unspecified back pain laterality, unspecified chronicity -resolved  Deviation in opening and closing of the jaw -considering further eval/tx after ent eval for possible neck or thyroid nodule  -Patient advised to return or notify a doctor immediately if symptoms worsen or persist or new concerns arise.  Patient Instructions  -We placed a referral for you as discussed to the ENT doctor for evaluation of the neck issues. It usually takes about 1-2 weeks to process and schedule this referral. If you have not heard from [place] regarding this appointment in 2 weeks please contact our office.  Follow up here after the ENT evaluation.    Kriste Basque R., DO

## 2017-08-25 ENCOUNTER — Encounter: Payer: Self-pay | Admitting: Family Medicine

## 2017-08-25 ENCOUNTER — Ambulatory Visit (INDEPENDENT_AMBULATORY_CARE_PROVIDER_SITE_OTHER): Payer: Medicare Other | Admitting: Family Medicine

## 2017-08-25 VITALS — BP 118/72 | HR 69 | Temp 98.0°F | Ht 68.0 in | Wt 152.0 lb

## 2017-08-25 DIAGNOSIS — N399 Disorder of urinary system, unspecified: Secondary | ICD-10-CM | POA: Diagnosis not present

## 2017-08-25 LAB — POC URINALSYSI DIPSTICK (AUTOMATED)
Blood, UA: NEGATIVE
Clarity, UA: NEGATIVE
Glucose, UA: NEGATIVE
Ketones, UA: NEGATIVE
Leukocytes, UA: NEGATIVE
NITRITE UA: NEGATIVE
Spec Grav, UA: >=1.03 — AB (ref 1.010–1.025)
Urobilinogen, UA: 0.2 E.U./dL
pH, UA: 5.5 (ref 5.0–8.0)

## 2017-08-25 NOTE — Patient Instructions (Signed)
Follow for any fever or other new concerns Let me know by next week if back and abdominal pain not better.

## 2017-08-25 NOTE — Progress Notes (Signed)
Subjective:     Patient ID: Tracey Giles, female   DOB: Oct 03, 1951, 66 y.o.   MRN: [id number]  HPI Patient seen with one-week history of some intermittent urinary pressure and occasional burning with urination. More than anything she's had some diffuse bilateral low back pain. No injury. No fevers or chills. No flank pain. No nausea, vomiting, or diarrhea. Denies any stool changes. No urinary incontinence. No history of frequent UTI. No vaginal discharge.  Denies radiculitis symptoms.  She is scheduled for complete physical the end of October. Appetite and weight stable  Past Medical History:  Diagnosis Date  . Essential hypertension   . History of dermatitis   . History of migraine headaches   . Hyperlipemia 05/24/2016  . Hypothyroidism   . MVP (mitral valve prolapse)    Reported history  . Palpitations    Rare PVCs by Holter monitor April 2011   . Sleep apnea    Reportedly mild  . Vitamin D insufficiency 05/24/2016   Past Surgical History:  Procedure Laterality Date  . APPENDECTOMY    . BREAST LUMPECTOMY     Benign    reports that she has never smoked. She has never used smokeless tobacco. She reports that she does not drink alcohol or use drugs. family history includes Asthma in her brother and mother; Melanoma in her father; Stroke in her maternal grandfather and mother. Allergies  Allergen Reactions  . Tums [Calcium Carbonate Antacid] Hives    Burning sensation in mouth, rash on torso  . Cephalexin   . Penicillins     REACTION: unspecified  . Sulfamethoxazole-Trimethoprim     REACTION: unspecified     Review of Systems  Constitutional: Negative for chills and fever.  Respiratory: Negative for shortness of breath.   Cardiovascular: Negative for chest pain.  Gastrointestinal: Negative for abdominal distention, diarrhea, nausea and vomiting.  Endocrine: Negative for polydipsia and polyuria.  Genitourinary: Positive for dysuria.  Musculoskeletal: Positive for  back pain.       Objective:   Physical Exam  Constitutional: She appears well-developed and well-nourished.  Cardiovascular: Normal rate and regular rhythm.   Pulmonary/Chest: Effort normal and breath sounds normal. No respiratory distress. She has no wheezes. She has no rales.  Abdominal: Soft. Bowel sounds are normal. She exhibits no distension and no mass. There is no rebound and no guarding.  Minimally tender to deep palpation right and left lower quadrants. No guarding or rebound. No mass.       Assessment:     Patient presents with one-week history of some urinary symptoms of mild dysuria which are intermittent with diffuse low back pain. Urine dipstick is negative for leukocytes, blood, and nitrites which makes UTI much less likely.    Plan:     -Stay well-hydrated -Follow-up immediately for any fever, hematuria, or other concern -We recommended she touch base by next week if symptoms persist. Would consider moving up her physical sooner if she continues to have vague symptoms as above  Kristian Covey MD [facility]

## 2017-08-26 ENCOUNTER — Encounter: Payer: Self-pay | Admitting: Family Medicine

## 2017-09-29 NOTE — Progress Notes (Signed)
Medicare Annual Preventive Care Visit  (initial annual wellness or annual wellness exam)  Concerns and/or follow up today:  Abd Pain: -started 6 weeks ago -Diffuse pain in the lower abdomen, constant, worse at times -Reports is constant, sometimes feels like pressure and sometimes pain -Denies fevers, malaise, constipation, diarrhea, change in bowels, dysuria, hematuria, hematochezia, melena, nausea, vomiting or unexplained weight loss  Neck pain/?mass: -recommended ENT eval last visit - reports saw ENT and negative eval and doing better  palpitations/HLD: -meds:metoprolol  Hypothyroid: -meds: levothyroxine  Osteoporosis: -she declined medications for this -taking vit D3, adequate calcium and doing wt bearing exercise -last dexa 1.2018  GERD: -meds: prilosec, pepcid  Asthma/allergies: -meds: dymista, symicort  -Diet: variety of foods, balance and well rounded  -Exercise: regular exercise  -pap history: 05/2014 last pap and normal  -FDLMP:n/a  -sexual activity: yes, female partner, no new partners  -wants STI testing (Hep C if born [birth year]): no  -FH breast, colon or ovarian ca: see FH Last mammogram: 10/2016 birads 2 Last colon cancer screening: last done 09/2007 - due for colon cancer screening and she would like to try the Cologuard test   See HM section in Epic for other details of completed HM. See scanned documentation under Media Tab for further documentation HPI, health risk assessment. See Media Tab and Care Teams sections in Epic for other providers.  ROS: negative for report of fevers, unintentional weight loss, vision changes, vision loss, hearing loss or change, chest pain, sob, hemoptysis, melena, hematochezia, hematuria, genital discharge or lesions, falls, bleeding or bruising, loc, thoughts of suicide or self harm, memory loss  1.) Patient-completed health risk assessment  - completed and reviewed, see scanned documentation  2.) Review of Medical  History: -PMH, PSH, Family History and current specialty and care providers reviewed and updated and listed below  - see scanned in document in chart and below  Past Medical History:  Diagnosis Date  . Essential hypertension   . History of dermatitis   . History of migraine headaches   . Hyperlipemia 05/24/2016  . Hypothyroidism   . MVP (mitral valve prolapse)    Reported history  . Palpitations    Rare PVCs by Holter monitor April 2011   . Sleep apnea    Reportedly mild  . Vitamin D insufficiency 05/24/2016    Past Surgical History:  Procedure Laterality Date  . APPENDECTOMY    . BREAST LUMPECTOMY     Benign    Social History   Social History  . Marital status: Married    Spouse name: N/A  . Number of children: N/A  . Years of education: N/A   Occupational History  . [employer]   Social History Main Topics  . Smoking status: Never Smoker  . Smokeless tobacco: Never Used  . Alcohol use No  . Drug use: No  . Sexual activity: Yes   Other Topics Concern  . Not on file   Social History Narrative   Work or School: retired - used to work for [employer] Situation: lives with husband      Spiritual Beliefs: Christian      Lifestyle: no regular exercise; diet is healthy          Family History  Problem Relation Age of Onset  . Melanoma Father   . Stroke Mother   . Asthma Mother   . Stroke Maternal Grandfather   . Asthma Brother   . Thyroid disease  Neg Hx     Current Outpatient Prescriptions on File Prior to Visit  Medication Sig Dispense Refill  . Azelastine-Fluticasone (DYMISTA) 137-50 MCG/ACT SUSP Place 2 puffs into the nose daily. 1 Bottle 0  . budesonide-formoterol (SYMBICORT) 80-4.5 MCG/ACT inhaler 2 puffs at least  15 min before exercise 1 Inhaler 11  . chlorpheniramine (CHLOR-TRIMETON) 4 MG tablet Take 4 mg by mouth every 4 (four) hours as needed for allergies.    . Cholecalciferol (VITAMIN D3 PO) Take 1,000 Units by mouth  daily.    . cyclobenzaprine (FLEXERIL) 5 MG tablet Take 1 tablet (5 mg total) by mouth 2 (two) times daily as needed for muscle spasms. 20 tablet 0  . famotidine (PEPCID) 20 MG tablet One at bedtime 30 tablet 11  . levothyroxine (SYNTHROID, LEVOTHROID) 75 MCG tablet Take 1 tablet (75 mcg total) by mouth daily. 90 tablet 3  . LORazepam (ATIVAN) 0.5 MG tablet Use 1/2 to 1 tablet as needed for panic attacks/anxiety 30 tablet 0  . metoprolol succinate (TOPROL-XL) 100 MG 24 hr tablet Take 0.5 tablet every morning and 0.5 tablet every evening 90 tablet 1  . omeprazole (PRILOSEC) 20 MG capsule Take 20 mg by mouth daily.     No current facility-administered medications on file prior to visit.      3.) Review of functional ability and level of safety:  Any difficulty hearing?  See scanned documentation  History of falling?  See scanned documentation  Any trouble with IADLs - using a phone, using transportation, grocery shopping, preparing meals, doing housework, doing laundry, taking medications and managing money?  See scanned documentation  Advance Directives?  Discussed briefly and offered more resources and detailed discussion with our trained staff.   See summary of recommendations in Patient Instructions below.  4.) Physical Exam Vitals:   09/30/17 0807  BP: 100/60  Pulse: (!) 54  Temp: 98 F (36.7 C)   Estimated body mass index is 23.88 kg/m as calculated from the following:   Height as of this encounter: '5\' 7"'  (1.702 m).   Weight as of this encounter: 152 lb 8 oz (69.2 kg).  EKG (optional): deferred  General: alert, appear well hydrated and in no acute distress  HEENT: visual acuity grossly intact  CV: HRRR  Lungs: CTA bilaterally  Abdomen: bowel sounds are positive in all 4 quadrants, soft, she is tender to palpation diffusely, mainly in the lower abdomen with some guarding on exam, no rebound  Psych: pleasant and cooperative, no obvious depression or  anxiety  Cognitive function grossly intact  See patient instructions for recommendations.  Education and counseling regarding the above review of health provided with a plan for the following: -see scanned patient completed form for further details -fall prevention strategies discussed  -healthy lifestyle discussed -importance and resources for completing advanced directives discussed -see patient instructions below for any other recommendations provided  4)The following written screening schedule of preventive measures were reviewed with assessment and plan made per below, orders and patient instructions:       Alcohol screening done     Obesity Screening and counseling done     STI screening (Hep C if born [birth year]-65) offered and per pt wishes     Tobacco Screening done        Pneumococcal (PPSV23 -one dose after 64, one before if risk factors), influenza yearly and hepatitis B vaccines (if high risk - end stage renal disease, IV drugs, homosexual men, live in home for mentally retarded,  hemophilia receiving factors) ASSESSMENT/PLAN: done if applicable      Screening mammograph (yearly if >40) ASSESSMENT/PLAN: utd or ordered      Screening Pap smear/pelvic exam (q2 years) ASSESSMENT/PLAN: n/a, declined       Colorectal cancer screening (FOBT yearly or flex sig q4y or colonoscopy q10y or barium enema q4y) ASSESSMENT/PLAN: due for screening, discussed options and she would like to try the Cologuard test, and advised assistant to order      Diabetes outpatient self-management training services ASSESSMENT/PLAN: utd or done      Bone mass measurements(covered q2y if indicated - estrogen def, osteoporosis, hyperparathyroid, vertebral abnormalities, osteoporosis or steroids) ASSESSMENT/PLAN: utd or discussed and ordered per pt wishes      Screening for glaucoma(q1y if high risk - diabetes, FH, AA and > 50 or hispanic and > 65) ASSESSMENT/PLAN: utd or advised      Medical  nutritional therapy for individuals with diabetes or renal disease ASSESSMENT/PLAN: see orders      Cardiovascular screening blood tests (lipids q5y) ASSESSMENT/PLAN: see orders and labs      Diabetes screening tests ASSESSMENT/PLAN: see orders and labs   7.) Summary:  Medicare annual wellness visit, subsequent - Plan: Hemoglobin A1c -risk factors and conditions per above assessment were discussed and treatment, recommendations and referrals were offered per documentation above and orders and patient instructions.  Lower abdominal pain - Plan: Basic metabolic panel, CBC, CT Abdomen Pelvis W Contrast -we discussed possible serious and likely etiologies, workup and treatment, treatment risks and return precautions -after this discussion, Tracey Giles opted for labs per orders and CT scan -follow up advised pending results -of course, we advised Tracey Giles  to return or notify a doctor immediately if symptoms worsen or persist or new concerns arise.  Screening for depression - negative  Hypothyroidism, unspecified type - Plan: TSH, continue current treatment, adjust if needed pending results  Hyperlipidemia, unspecified hyperlipidemia type - Plan: Lipid panel  Palpitations - continue current treatment  Need for tetanus booster - Plan: Td vaccine greater than or equal to 7yo preservative free IM  Patient Instructions   BEFORE YOU LEAVE: advanced directives with susan if possible -labs -tetanus booster -please refer for colon cancer screening per her preference -follow up: 3 months  -Please arrange your colon cancer screening  -We placed a referral for you as discussed for the CT scan. If you have not heard from [place] regarding this appointment in 1 week please contact our office.  We have ordered labs or studies at this visit. It can take up to 1-2 weeks for results and processing. IF results require follow up or explanation, we will call you with instructions. Clinically stable  results will be released to your [facility]. If you have not heard from [place] or cannot find your results in [facility] in 2 weeks please contact our office at [phone].  If you are not yet signed up for [facility], please consider signing up.   Tracey Giles , Thank you for taking time to come for your Medicare Wellness Visit. I appreciate your ongoing commitment to your health goals. Please review the following plan we discussed and let me know if I can assist you in the future.   These are the goals we discussed: Goals    None      This is a list of the screening recommended for you and due dates:  Health Maintenance  Topic Date Due  . Tetanus Vaccine  09/22/2017  . Colon Cancer Screening  10/05/2017  . Pneumonia vaccines (2 of 2 - PPSV23) 12/11/2017  . Mammogram  10/09/2018  . DEXA scan (bone density measurement)  12/18/2018  . Flu Shot  Completed  .  Hepatitis C: One time screening is recommended by Center for Disease Control  (CDC) for  adults born from [birth year] through 1965.   Addressed      Health Maintenance for Postmenopausal Women Menopause is a normal process in which your reproductive ability comes to an end. This process happens gradually over a span of months to years, usually between the ages of 75 and 50. Menopause is complete when you have missed 12 consecutive menstrual periods. It is important to talk with your health care provider about some of the most common conditions that affect postmenopausal women, such as heart disease, cancer, and bone loss (osteoporosis). Adopting a healthy lifestyle and getting preventive care can help to promote your health and wellness. Those actions can also lower your chances of developing some of these common conditions. What should I know about menopause? During menopause, you may experience a number of symptoms, such as:  Moderate-to-severe hot flashes.  Night sweats.  Decrease in sex drive.  Mood  swings.  Headaches.  Tiredness.  Irritability.  Memory problems.  Insomnia.  Choosing to treat or not to treat menopausal changes is an individual decision that you make with your health care provider. What should I know about hormone replacement therapy and supplements? Hormone therapy products are effective for treating symptoms that are associated with menopause, such as hot flashes and night sweats. Hormone replacement carries certain risks, especially as you become older. If you are thinking about using estrogen or estrogen with progestin treatments, discuss the benefits and risks with your health care provider. What should I know about heart disease and stroke? Heart disease, heart attack, and stroke become more likely as you age. This may be due, in part, to the hormonal changes that your body experiences during menopause. These can affect how your body processes dietary fats, triglycerides, and cholesterol. Heart attack and stroke are both medical emergencies. There are many things that you can do to help prevent heart disease and stroke:  Have your blood pressure checked at least every 1-2 years. High blood pressure causes heart disease and increases the risk of stroke.  If you are 27-29 years old, ask your health care provider if you should take aspirin to prevent a heart attack or a stroke.  Do not use any tobacco products, including cigarettes, chewing tobacco, or electronic cigarettes. If you need help quitting, ask your health care provider.  It is important to eat a healthy diet and maintain a healthy weight. ? Be sure to include plenty of vegetables, fruits, low-fat dairy products, and lean protein. ? Avoid eating foods that are high in solid fats, added sugars, or salt (sodium).  Get regular exercise. This is one of the most important things that you can do for your health. ? Try to exercise for at least 150 minutes each week. The type of exercise that you do should  increase your heart rate and make you sweat. This is known as moderate-intensity exercise. ? Try to do strengthening exercises at least twice each week. Do these in addition to the moderate-intensity exercise.  Know your numbers.Ask your health care provider to check your cholesterol and your blood glucose. Continue to have your blood tested as directed by your health care provider.  What should I know about cancer screening? There are  several types of cancer. Take the following steps to reduce your risk and to catch any cancer development as early as possible. Breast Cancer  Practice breast self-awareness. ? This means understanding how your breasts normally appear and feel. ? It also means doing regular breast self-exams. Let your health care provider know about any changes, no matter how small.  If you are 75 or older, have a clinician do a breast exam (clinical breast exam or CBE) every year. Depending on your age, family history, and medical history, it may be recommended that you also have a yearly breast X-ray (mammogram).  If you have a family history of breast cancer, talk with your health care provider about genetic screening.  If you are at high risk for breast cancer, talk with your health care provider about having an MRI and a mammogram every year.  Breast cancer (BRCA) gene test is recommended for women who have family members with BRCA-related cancers. Results of the assessment will determine the need for genetic counseling and BRCA1 and for BRCA2 testing. BRCA-related cancers include these types: ? Breast. This occurs in males or females. ? Ovarian. ? Tubal. This may also be called fallopian tube cancer. ? Cancer of the abdominal or pelvic lining (peritoneal cancer). ? Prostate. ? Pancreatic.  Cervical, Uterine, and Ovarian Cancer Your health care provider may recommend that you be screened regularly for cancer of the pelvic organs. These include your ovaries, uterus,  and vagina. This screening involves a pelvic exam, which includes checking for microscopic changes to the surface of your cervix (Pap test).  For women ages 21-65, health care providers may recommend a pelvic exam and a Pap test every three years. For women ages 19-65, they may recommend the Pap test and pelvic exam, combined with testing for human papilloma virus (HPV), every five years. Some types of HPV increase your risk of cervical cancer. Testing for HPV may also be done on women of any age who have unclear Pap test results.  Other health care providers may not recommend any screening for nonpregnant women who are considered low risk for pelvic cancer and have no symptoms. Ask your health care provider if a screening pelvic exam is right for you.  If you have had past treatment for cervical cancer or a condition that could lead to cancer, you need Pap tests and screening for cancer for at least 20 years after your treatment. If Pap tests have been discontinued for you, your risk factors (such as having a new sexual partner) need to be reassessed to determine if you should start having screenings again. Some women have medical problems that increase the chance of getting cervical cancer. In these cases, your health care provider may recommend that you have screening and Pap tests more often.  If you have a family history of uterine cancer or ovarian cancer, talk with your health care provider about genetic screening.  If you have vaginal bleeding after reaching menopause, tell your health care provider.  There are currently no reliable tests available to screen for ovarian cancer.  Lung Cancer Lung cancer screening is recommended for adults 72-2 years old who are at high risk for lung cancer because of a history of smoking. A yearly low-dose CT scan of the lungs is recommended if you:  Currently smoke.  Have a history of at least 30 pack-years of smoking and you currently smoke or have quit  within the past 15 years. A pack-year is smoking an average of  one pack of cigarettes per day for one year.  Yearly screening should:  Continue until it has been 15 years since you quit.  Stop if you develop a health problem that would prevent you from having lung cancer treatment.  Colorectal Cancer  This type of cancer can be detected and can often be prevented.  Routine colorectal cancer screening usually begins at age 87 and continues through age 42.  If you have risk factors for colon cancer, your health care provider may recommend that you be screened at an earlier age.  If you have a family history of colorectal cancer, talk with your health care provider about genetic screening.  Your health care provider may also recommend using home test kits to check for hidden blood in your stool.  A small camera at the end of a tube can be used to examine your colon directly (sigmoidoscopy or colonoscopy). This is done to check for the earliest forms of colorectal cancer.  Direct examination of the colon should be repeated every 5-10 years until age 45. However, if early forms of precancerous polyps or small growths are found or if you have a family history or genetic risk for colorectal cancer, you may need to be screened more often.  Skin Cancer  Check your skin from head to toe regularly.  Monitor any moles. Be sure to tell your health care provider: ? About any new moles or changes in moles, especially if there is a change in a mole's shape or color. ? If you have a mole that is larger than the size of a pencil eraser.  If any of your family members has a history of skin cancer, especially at a young age, talk with your health care provider about genetic screening.  Always use sunscreen. Apply sunscreen liberally and repeatedly throughout the day.  Whenever you are outside, protect yourself by wearing long sleeves, pants, a wide-brimmed hat, and sunglasses.  What should I know  about osteoporosis? Osteoporosis is a condition in which bone destruction happens more quickly than new bone creation. After menopause, you may be at an increased risk for osteoporosis. To help prevent osteoporosis or the bone fractures that can happen because of osteoporosis, the following is recommended:  If you are 19-36 years old, get at least 1,000 mg of calcium and at least 600 mg of vitamin D per day.  If you are older than age 29 but younger than age 89, get at least 1,200 mg of calcium and at least 600 mg of vitamin D per day.  If you are older than age 81, get at least 1,200 mg of calcium and at least 800 mg of vitamin D per day.  Smoking and excessive alcohol intake increase the risk of osteoporosis. Eat foods that are rich in calcium and vitamin D, and do weight-bearing exercises several times each week as directed by your health care provider. What should I know about how menopause affects my mental health? Depression may occur at any age, but it is more common as you become older. Common symptoms of depression include:  Low or sad mood.  Changes in sleep patterns.  Changes in appetite or eating patterns.  Feeling an overall lack of motivation or enjoyment of activities that you previously enjoyed.  Frequent crying spells.  Talk with your health care provider if you think that you are experiencing depression. What should I know about immunizations? It is important that you get and maintain your immunizations. These include:  Tetanus, diphtheria, and pertussis (Tdap) booster vaccine.  Influenza every year before the flu season begins.  Pneumonia vaccine.  Shingles vaccine.  Your health care provider may also recommend other immunizations. This information is not intended to replace advice given to you by your health care provider. Make sure you discuss any questions you have with your health care provider. Document Released: 01/15/2006 Document Revised: 06/12/2016  Document Reviewed: 08/27/2015 Elsevier Interactive Patient Education  2018 [place]., [place]

## 2017-09-30 ENCOUNTER — Ambulatory Visit (INDEPENDENT_AMBULATORY_CARE_PROVIDER_SITE_OTHER): Payer: Medicare Other | Admitting: Family Medicine

## 2017-09-30 ENCOUNTER — Encounter: Payer: Self-pay | Admitting: Family Medicine

## 2017-09-30 VITALS — BP 100/60 | HR 54 | Temp 98.0°F | Ht 67.0 in | Wt 152.5 lb

## 2017-09-30 DIAGNOSIS — Z0000 Encounter for general adult medical examination without abnormal findings: Secondary | ICD-10-CM

## 2017-09-30 DIAGNOSIS — Z23 Encounter for immunization: Secondary | ICD-10-CM | POA: Diagnosis not present

## 2017-09-30 DIAGNOSIS — E039 Hypothyroidism, unspecified: Secondary | ICD-10-CM | POA: Diagnosis not present

## 2017-09-30 DIAGNOSIS — R1030 Lower abdominal pain, unspecified: Secondary | ICD-10-CM

## 2017-09-30 DIAGNOSIS — E785 Hyperlipidemia, unspecified: Secondary | ICD-10-CM

## 2017-09-30 DIAGNOSIS — R002 Palpitations: Secondary | ICD-10-CM

## 2017-09-30 DIAGNOSIS — Z1331 Encounter for screening for depression: Secondary | ICD-10-CM | POA: Diagnosis not present

## 2017-09-30 LAB — CBC
HCT: 43.1 % (ref 36.0–46.0)
HEMOGLOBIN: 14.3 g/dL (ref 12.0–15.0)
MCHC: 33.1 g/dL (ref 30.0–36.0)
MCV: 92.2 fl (ref 78.0–100.0)
PLATELETS: 307 10*3/uL (ref 150.0–400.0)
RBC: 4.67 Mil/uL (ref 3.87–5.11)
RDW: 13 % (ref 11.5–15.5)
WBC: 5.4 10*3/uL (ref 4.0–10.5)

## 2017-09-30 LAB — LIPID PANEL
CHOLESTEROL: 178 mg/dL (ref 0–200)
HDL: 50.4 mg/dL (ref >39.00)
LDL CALC: 101 mg/dL — AB (ref 0–99)
NonHDL: 127.82
TRIGLYCERIDES: 133 mg/dL (ref 0.0–149.0)
Total CHOL/HDL Ratio: 4
VLDL: 26.6 mg/dL (ref 0.0–40.0)

## 2017-09-30 LAB — BASIC METABOLIC PANEL
BUN: 13 mg/dL (ref 6–23)
CO2: 28 mEq/L (ref 19–32)
Calcium: 9.5 mg/dL (ref 8.4–10.5)
Chloride: 106 mEq/L (ref 96–112)
Creatinine, Ser: 0.8 mg/dL (ref 0.40–1.20)
GFR: 76.25 mL/min (ref >60.00)
GLUCOSE: 91 mg/dL (ref 70–99)
POTASSIUM: 4.4 meq/L (ref 3.5–5.1)
SODIUM: 142 meq/L (ref 135–145)

## 2017-09-30 LAB — HEMOGLOBIN A1C: Hgb A1c MFr Bld: 5.6 % (ref 4.6–6.5)

## 2017-09-30 LAB — TSH: TSH: 1.92 u[IU]/mL (ref 0.35–4.50)

## 2017-09-30 NOTE — Patient Instructions (Signed)
BEFORE YOU LEAVE: advanced directives with susan if possible -labs -tetanus booster -please refer for colon cancer screening per her preference -follow up: 3 months  -Please arrange your colon cancer screening  -We placed a referral for you as discussed for the CT scan. If you have not heard from [place] regarding this appointment in 1 week please contact our office.  We have ordered labs or studies at this visit. It can take up to 1-2 weeks for results and processing. IF results require follow up or explanation, we will call you with instructions. Clinically stable results will be released to your [facility]. If you have not heard from [place] or cannot find your results in [facility] in 2 weeks please contact our office at [phone].  If you are not yet signed up for [facility], please consider signing up.   Tracey Giles , Thank you for taking time to come for your Medicare Wellness Visit. I appreciate your ongoing commitment to your health goals. Please review the following plan we discussed and let me know if I can assist you in the future.   These are the goals we discussed: Goals    None      This is a list of the screening recommended for you and due dates:  Health Maintenance  Topic Date Due  . Tetanus Vaccine  09/22/2017  . Colon Cancer Screening  10/05/2017  . Pneumonia vaccines (2 of 2 - PPSV23) 12/11/2017  . Mammogram  10/09/2018  . DEXA scan (bone density measurement)  12/18/2018  . Flu Shot  Completed  .  Hepatitis C: One time screening is recommended by Center for Disease Control  (CDC) for  adults born from [birth year] through 1965.   Addressed      Health Maintenance for Postmenopausal Women Menopause is a normal process in which your reproductive ability comes to an end. This process happens gradually over a span of months to years, usually between the ages of 60 and 45. Menopause is complete when you have missed 12 consecutive menstrual periods. It is important to talk with your  health care provider about some of the most common conditions that affect postmenopausal women, such as heart disease, cancer, and bone loss (osteoporosis). Adopting a healthy lifestyle and getting preventive care can help to promote your health and wellness. Those actions can also lower your chances of developing some of these common conditions. What should I know about menopause? During menopause, you may experience a number of symptoms, such as:  Moderate-to-severe hot flashes.  Night sweats.  Decrease in sex drive.  Mood swings.  Headaches.  Tiredness.  Irritability.  Memory problems.  Insomnia.  Choosing to treat or not to treat menopausal changes is an individual decision that you make with your health care provider. What should I know about hormone replacement therapy and supplements? Hormone therapy products are effective for treating symptoms that are associated with menopause, such as hot flashes and night sweats. Hormone replacement carries certain risks, especially as you become older. If you are thinking about using estrogen or estrogen with progestin treatments, discuss the benefits and risks with your health care provider. What should I know about heart disease and stroke? Heart disease, heart attack, and stroke become more likely as you age. This may be due, in part, to the hormonal changes that your body experiences during menopause. These can affect how your body processes dietary fats, triglycerides, and cholesterol. Heart attack and stroke are both medical emergencies. There are many things that  you can do to help prevent heart disease and stroke:  Have your blood pressure checked at least every 1-2 years. High blood pressure causes heart disease and increases the risk of stroke.  If you are 76-58 years old, ask your health care provider if you should take aspirin to prevent a heart attack or a stroke.  Do not use any tobacco products, including cigarettes,  chewing tobacco, or electronic cigarettes. If you need help quitting, ask your health care provider.  It is important to eat a healthy diet and maintain a healthy weight. ? Be sure to include plenty of vegetables, fruits, low-fat dairy products, and lean protein. ? Avoid eating foods that are high in solid fats, added sugars, or salt (sodium).  Get regular exercise. This is one of the most important things that you can do for your health. ? Try to exercise for at least 150 minutes each week. The type of exercise that you do should increase your heart rate and make you sweat. This is known as moderate-intensity exercise. ? Try to do strengthening exercises at least twice each week. Do these in addition to the moderate-intensity exercise.  Know your numbers.Ask your health care provider to check your cholesterol and your blood glucose. Continue to have your blood tested as directed by your health care provider.  What should I know about cancer screening? There are several types of cancer. Take the following steps to reduce your risk and to catch any cancer development as early as possible. Breast Cancer  Practice breast self-awareness. ? This means understanding how your breasts normally appear and feel. ? It also means doing regular breast self-exams. Let your health care provider know about any changes, no matter how small.  If you are 40 or older, have a clinician do a breast exam (clinical breast exam or CBE) every year. Depending on your age, family history, and medical history, it may be recommended that you also have a yearly breast X-ray (mammogram).  If you have a family history of breast cancer, talk with your health care provider about genetic screening.  If you are at high risk for breast cancer, talk with your health care provider about having an MRI and a mammogram every year.  Breast cancer (BRCA) gene test is recommended for women who have family members with BRCA-related  cancers. Results of the assessment will determine the need for genetic counseling and BRCA1 and for BRCA2 testing. BRCA-related cancers include these types: ? Breast. This occurs in males or females. ? Ovarian. ? Tubal. This may also be called fallopian tube cancer. ? Cancer of the abdominal or pelvic lining (peritoneal cancer). ? Prostate. ? Pancreatic.  Cervical, Uterine, and Ovarian Cancer Your health care provider may recommend that you be screened regularly for cancer of the pelvic organs. These include your ovaries, uterus, and vagina. This screening involves a pelvic exam, which includes checking for microscopic changes to the surface of your cervix (Pap test).  For women ages 21-65, health care providers may recommend a pelvic exam and a Pap test every three years. For women ages 46-65, they may recommend the Pap test and pelvic exam, combined with testing for human papilloma virus (HPV), every five years. Some types of HPV increase your risk of cervical cancer. Testing for HPV may also be done on women of any age who have unclear Pap test results.  Other health care providers may not recommend any screening for nonpregnant women who are considered low risk for pelvic  cancer and have no symptoms. Ask your health care provider if a screening pelvic exam is right for you.  If you have had past treatment for cervical cancer or a condition that could lead to cancer, you need Pap tests and screening for cancer for at least 20 years after your treatment. If Pap tests have been discontinued for you, your risk factors (such as having a new sexual partner) need to be reassessed to determine if you should start having screenings again. Some women have medical problems that increase the chance of getting cervical cancer. In these cases, your health care provider may recommend that you have screening and Pap tests more often.  If you have a family history of uterine cancer or ovarian cancer, talk with  your health care provider about genetic screening.  If you have vaginal bleeding after reaching menopause, tell your health care provider.  There are currently no reliable tests available to screen for ovarian cancer.  Lung Cancer Lung cancer screening is recommended for adults 61-78 years old who are at high risk for lung cancer because of a history of smoking. A yearly low-dose CT scan of the lungs is recommended if you:  Currently smoke.  Have a history of at least 30 pack-years of smoking and you currently smoke or have quit within the past 15 years. A pack-year is smoking an average of one pack of cigarettes per day for one year.  Yearly screening should:  Continue until it has been 15 years since you quit.  Stop if you develop a health problem that would prevent you from having lung cancer treatment.  Colorectal Cancer  This type of cancer can be detected and can often be prevented.  Routine colorectal cancer screening usually begins at age 23 and continues through age 83.  If you have risk factors for colon cancer, your health care provider may recommend that you be screened at an earlier age.  If you have a family history of colorectal cancer, talk with your health care provider about genetic screening.  Your health care provider may also recommend using home test kits to check for hidden blood in your stool.  A small camera at the end of a tube can be used to examine your colon directly (sigmoidoscopy or colonoscopy). This is done to check for the earliest forms of colorectal cancer.  Direct examination of the colon should be repeated every 5-10 years until age 24. However, if early forms of precancerous polyps or small growths are found or if you have a family history or genetic risk for colorectal cancer, you may need to be screened more often.  Skin Cancer  Check your skin from head to toe regularly.  Monitor any moles. Be sure to tell your health care  provider: ? About any new moles or changes in moles, especially if there is a change in a mole's shape or color. ? If you have a mole that is larger than the size of a pencil eraser.  If any of your family members has a history of skin cancer, especially at a young age, talk with your health care provider about genetic screening.  Always use sunscreen. Apply sunscreen liberally and repeatedly throughout the day.  Whenever you are outside, protect yourself by wearing long sleeves, pants, a wide-brimmed hat, and sunglasses.  What should I know about osteoporosis? Osteoporosis is a condition in which bone destruction happens more quickly than new bone creation. After menopause, you may be at an increased  risk for osteoporosis. To help prevent osteoporosis or the bone fractures that can happen because of osteoporosis, the following is recommended:  If you are 36-47 years old, get at least 1,000 mg of calcium and at least 600 mg of vitamin D per day.  If you are older than age 6 but younger than age 69, get at least 1,200 mg of calcium and at least 600 mg of vitamin D per day.  If you are older than age 9, get at least 1,200 mg of calcium and at least 800 mg of vitamin D per day.  Smoking and excessive alcohol intake increase the risk of osteoporosis. Eat foods that are rich in calcium and vitamin D, and do weight-bearing exercises several times each week as directed by your health care provider. What should I know about how menopause affects my mental health? Depression may occur at any age, but it is more common as you become older. Common symptoms of depression include:  Low or sad mood.  Changes in sleep patterns.  Changes in appetite or eating patterns.  Feeling an overall lack of motivation or enjoyment of activities that you previously enjoyed.  Frequent crying spells.  Talk with your health care provider if you think that you are experiencing depression. What should I know  about immunizations? It is important that you get and maintain your immunizations. These include:  Tetanus, diphtheria, and pertussis (Tdap) booster vaccine.  Influenza every year before the flu season begins.  Pneumonia vaccine.  Shingles vaccine.  Your health care provider may also recommend other immunizations. This information is not intended to replace advice given to you by your health care provider. Make sure you discuss any questions you have with your health care provider. Document Released: 01/15/2006 Document Revised: 06/12/2016 Document Reviewed: 08/27/2015 Elsevier Interactive Patient Education  2018 [employer].

## 2017-10-01 ENCOUNTER — Telehealth: Payer: Self-pay | Admitting: Family Medicine

## 2017-10-01 NOTE — Telephone Encounter (Signed)
Order completed and faxed to [facility] at [phone]. 

## 2017-10-01 NOTE — Telephone Encounter (Signed)
Pt states her insurance WILL approve colo guard. Pt would like to proceed

## 2017-10-04 ENCOUNTER — Ambulatory Visit (INDEPENDENT_AMBULATORY_CARE_PROVIDER_SITE_OTHER)
Admission: RE | Admit: 2017-10-04 | Discharge: 2017-10-04 | Disposition: A | Discharge status: Home / Self Care | Payer: Medicare Other | Source: Ambulatory Visit | Attending: Family Medicine | Admitting: Family Medicine

## 2017-10-04 DIAGNOSIS — R1030 Lower abdominal pain, unspecified: Secondary | ICD-10-CM | POA: Diagnosis not present

## 2017-10-04 MED ORDER — IOPAMIDOL (ISOVUE-300) INJECTION 61%
100.0000 mL | Freq: Once | INTRAVENOUS | Status: AC | PRN
  Administered 2017-10-04: 100 mL via INTRAVENOUS

## 2017-10-12 ENCOUNTER — Encounter: Payer: Self-pay | Admitting: Family Medicine

## 2017-10-12 LAB — COLOGUARD: Cologuard: NEGATIVE

## 2017-10-12 LAB — FECAL OCCULT BLOOD, GUAIAC: Fecal Occult Blood: NEGATIVE

## 2017-10-14 ENCOUNTER — Encounter: Payer: Self-pay | Admitting: Gastroenterology

## 2017-10-22 ENCOUNTER — Encounter: Payer: Self-pay | Admitting: Family Medicine

## 2017-11-01 LAB — HM MAMMOGRAPHY

## 2017-11-02 ENCOUNTER — Encounter: Payer: Self-pay | Admitting: Family Medicine

## 2017-11-24 ENCOUNTER — Other Ambulatory Visit: Payer: Self-pay | Admitting: Family Medicine

## 2017-11-26 ENCOUNTER — Other Ambulatory Visit: Payer: Self-pay | Admitting: *Deleted

## 2017-11-26 ENCOUNTER — Encounter: Payer: Self-pay | Admitting: Family Medicine

## 2017-11-26 MED ORDER — LEVOTHYROXINE SODIUM 75 MCG PO TABS: 75.0000 ug | ORAL_TABLET | Freq: Every day | ORAL | 0 refills | Status: DC

## 2017-11-26 NOTE — Telephone Encounter (Signed)
Rx done and pt informed via Mychart message. 

## 2017-11-29 ENCOUNTER — Telehealth: Payer: Self-pay | Admitting: *Deleted

## 2017-11-29 NOTE — Telephone Encounter (Signed)
[facility] (ph#[phone]) faxed a note stating Sandoz brand is no longer available and requested to change to Mylan.  I called the pharmacy and advised Tonya Dr Selena BattenKim does not mind if the pt does not mind and the dose does not change.

## 2017-12-02 ENCOUNTER — Other Ambulatory Visit: Payer: Self-pay | Admitting: Family Medicine

## 2017-12-16 ENCOUNTER — Encounter: Payer: Self-pay | Admitting: Family Medicine

## 2017-12-28 NOTE — Progress Notes (Signed)
HPI:  Tracey FischerCatherine M Giles is a pleasant 67 y.o. here for follow up. Chronic medical problems summarized below were reviewed for changes and stability and were updated as needed below. These issues and their treatment remain stable for the most part. Denies CP, SOB, DOE, treatment intolerance or new symptoms. Has noticed her posture is not as good as she gets older. No specific complaints, but wonders if certain exercises would help. Labs okay last check 3 months ago, due for pneumococcal 23  AWV 09/2017  palpitations/HLD: -meds:metoprolol  Hypothyroid: -meds: levothyroxine  Osteoporosis: -she declined medications for this -taking vit D3, adequate calcium and doing wt bearing exercise -last dexa 1.2018  GERD: -meds: prilosec, pepcid  Asthma/allergies: -meds: dymista, symicort -saw pulm in 2017 for dyspena/cough dx upper airway cough syndrome  ROS: See pertinent positives and negatives per HPI.  Past Medical History:  Diagnosis Date  . Essential hypertension   . History of dermatitis   . History of migraine headaches   . Hyperlipemia 05/24/2016  . Hypothyroidism   . MVP (mitral valve prolapse)    Reported history  . Palpitations    Rare PVCs by Holter monitor April 2011   . Sleep apnea    Reportedly mild  . Vitamin D insufficiency 05/24/2016    Past Surgical History:  Procedure Laterality Date  . APPENDECTOMY    . BREAST LUMPECTOMY     Benign    Family History  Problem Relation Age of Onset  . Melanoma Father   . Stroke Mother   . Asthma Mother   . Stroke Maternal Grandfather   . Asthma Brother   . Thyroid disease Neg Hx     Social History   Socioeconomic History  . Marital status: Married    Spouse name: None  . Number of children: None  . Years of education: None  . Highest education level: None  Social Needs  . Financial resource strain: None  . Food insecurity - worry: None  . Food insecurity - inability: None  . Transportation needs -  medical: None  . Transportation needs - non-medical: None  Occupational History  . Occupation: [employer]    Employer: [employer]  Tobacco Use  . Smoking status: Never Smoker  . Smokeless tobacco: Never Used  Substance and Sexual Activity  . Alcohol use: No  . Drug use: No  . Sexual activity: Yes  Other Topics Concern  . 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  Azelastine-Fluticasone (DYMISTA) 137-50 MCG/ACT SUSP, Place 2 puffs into the nose daily., Disp: 1 Bottle, Rfl: 0 .  budesonide-formoterol (SYMBICORT) 80-4.5 MCG/ACT inhaler, 2 puffs at least  15 min before exercise, Disp: 1 Inhaler, Rfl: 11 .  chlorpheniramine (CHLOR-TRIMETON) 4 MG tablet, Take 4 mg by mouth every 4 (four) hours as needed for allergies., Disp: , Rfl:  .  Cholecalciferol (VITAMIN D3 PO), Take 1,000 Units by mouth daily., Disp: , Rfl:  .  cyclobenzaprine (FLEXERIL) 5 MG tablet, Take 1 tablet (5 mg total) by mouth 2 (two) times daily as needed for muscle spasms., Disp: 20 tablet, Rfl: 0 .  levothyroxine (SYNTHROID, LEVOTHROID) 75 MCG tablet, TAKE 1 TABLET BY MOUTH ONCE DAILY, Disp: 90  tablet, Rfl: 0 .  LORazepam (ATIVAN) 0.5 MG tablet, Use 1/2 to 1 tablet as needed for panic attacks/anxiety, Disp: 30 tablet, Rfl: 0 .  metoprolol succinate (TOPROL-XL) 100 MG 24 hr tablet, TAKE 1/2 (ONE-HALF) TABLET BY MOUTH IN THE MORNING AND 1/2 (ONE-HALF) IN THE EVENING, Disp: 90 tablet, Rfl: 1 .  omeprazole (PRILOSEC) 20 MG capsule, Take 20 mg by mouth daily., Disp: , Rfl:   EXAM:  Vitals:   12/30/17 1017  BP: 128/78  Pulse: 61  Temp: 98.2 F (36.8 C)    Body mass index is 24.37 kg/m.  GENERAL: vitals reviewed and listed above, alert, oriented, appears well hydrated and in no acute  distress  HEENT: atraumatic, conjunttiva clear, no obvious abnormalities on inspection of external nose and ears  NECK: no obvious masses on inspection  LUNGS: clear to auscultation bilaterally, no wheezes, rales or rhonchi, good air movement  CV: HRRR, no peripheral edema  MS: moves all extremities without noticeable abnormality  PSYCH: pleasant and cooperative, no obvious depression or anxiety  ASSESSMENT AND PLAN:  Discussed the following assessment and plan:  Hypothyroidism, unspecified type  Palpitations  Hyperlipidemia, unspecified hyperlipidemia type  Osteoporosis, unspecified osteoporosis type, unspecified pathological fracture presence  Poor posture  -Lifestyle recommendations -Labs next visit -Draw exercises demonstrated and provided -Pneumococcal vaccine today -Patient advised to return or notify a doctor immediately if symptoms worsen or persist or new concerns arise.  Patient Instructions  BEFORE YOU LEAVE: -Pneumococcal 23 vaccine -follow up: 3-4 months, we will do labs then  Do the postural neck strengthening exercises daily: -stand with heals, buttocks, shoulder blades and head against wall -press back against wall gently with your head for 5 seconds, then against counter pressure with finger tips: to the right for 5 seconds, forward for 5 seconds, then to the left for 5 second (keep head straight and against wall the entire time) -repeat 10 times  We have ordered labs or studies at this visit. It can take up to 1-2 weeks for results and processing. IF results require follow up or explanation, we will call you with instructions. Clinically stable results will be released to your [facility]. If you have not heard from [place] or cannot find your results in [facility] in 2 weeks please contact our office at [phone].  If you are not yet signed up for [facility], please consider signing up.   We recommend the following healthy lifestyle for LIFE: 1) Small portions.  But, make sure to get regular (at least 3 per day), healthy meals and small healthy snacks if needed.  2) Eat a healthy clean diet.   TRY TO EAT: -at least 5-7 servings of low sugar, colorful, and nutrient rich vegetables per day (not corn, potatoes or bananas.) -berries are the best choice if you wish to eat fruit (only eat small amounts if trying to reduce weight)  -lean meets (fish, white meat of chicken or [place]) -vegan proteins for some meals - beans or tofu, whole grains, nuts and seeds -Replace bad fats with good fats - good fats include: fish, nuts and seeds, canola oil, olive oil -small amounts of low fat or non fat dairy -small amounts of100 % whole grains - check the lables -drink plenty of water  AVOID: -SUGAR, sweets, anything with added sugar, corn syrup or sweeteners - must read labels as even foods advertised as "healthy" often are loaded with sugar -if you must have a sweetener, small amounts of stevia may be best -sweetened beverages  and artificially sweetened beverages -simple starches (rice, bread, potatoes, pasta, chips, etc - small amounts of 100% whole grains are ok) -red meat, pork, butter -fried foods, fast food, processed food, excessive dairy, eggs and coconut.  3)Get at least 150 minutes of sweaty aerobic exercise per week.  4)Reduce stress - consider counseling, meditation and relaxation to balance other aspects of your life.            Terressa Koyanagi, DO

## 2017-12-30 ENCOUNTER — Ambulatory Visit: Payer: Medicare Other | Admitting: Family Medicine

## 2017-12-30 ENCOUNTER — Encounter: Payer: Self-pay | Admitting: Family Medicine

## 2017-12-30 VITALS — BP 128/78 | HR 61 | Temp 98.2°F | Ht 67.0 in | Wt 155.6 lb

## 2017-12-30 DIAGNOSIS — E039 Hypothyroidism, unspecified: Secondary | ICD-10-CM

## 2017-12-30 DIAGNOSIS — R002 Palpitations: Secondary | ICD-10-CM

## 2017-12-30 DIAGNOSIS — R293 Abnormal posture: Secondary | ICD-10-CM

## 2017-12-30 DIAGNOSIS — E785 Hyperlipidemia, unspecified: Secondary | ICD-10-CM

## 2017-12-30 DIAGNOSIS — M810 Age-related osteoporosis without current pathological fracture: Secondary | ICD-10-CM

## 2017-12-30 DIAGNOSIS — Z23 Encounter for immunization: Secondary | ICD-10-CM | POA: Diagnosis not present

## 2017-12-30 NOTE — Patient Instructions (Addendum)
BEFORE YOU LEAVE: -Pneumococcal 23 vaccine -follow up: 3-4 months, we will do labs then  Do the postural neck strengthening exercises daily: -stand with heals, buttocks, shoulder blades and head against wall -press back against wall gently with your head for 5 seconds, then against counter pressure with finger tips: to the right for 5 seconds, forward for 5 seconds, then to the left for 5 second (keep head straight and against wall the entire time) -repeat 10 times  We have ordered labs or studies at this visit. It can take up to 1-2 weeks for results and processing. IF results require follow up or explanation, we will call you with instructions. Clinically stable results will [facility]. If you have not heard from us or cannot find your results in [facility] in 2 weeks please contact our office at [phone].  If you are not yet signed [facility] for [facility], please consider signing up.   We recommend the following healthy lifestyle for LIFE: 1) Small portions. But, make sure to get regular (at least 3 per day), healthy meals and small healthy snacks if needed.  2) Eat a healthy clean diet.   TRY TO EAT: -at least 5-7 servings of low sugar, colorful, and nutrient rich vegetables per day (not corn, potatoes or bananas.) -berries are the best choice if you wish to eat fruit (only eat small amounts if trying to reduce weight)  -lean meets (fish, white meat of chicken or [place]) -vegan proteins for some meals - beans or tofu, whole grains, nuts and seeds -Replace bad fats with good fats - good fats include: fish, nuts and seeds, canola oil, olive oil -small amounts of low fat or non fat dairy -small amounts of100 % whole grains - check the lables -drink plenty of water  AVOID: -SUGAR, sweets, anything with added sugar, corn syrup or sweeteners - must read labels as even foods advertised as "healthy" often are loaded with sugar -if you must have a sweetener, small amounts of stevia  may be best -sweetened beverages and artificially sweetened beverages -simple starches (rice, bread, potatoes, pasta, chips, etc - small amounts of 100% whole grains are ok) -red meat, pork, butter -fried foods, fast food, processed food, excessive dairy, eggs and coconut.  3)Get at least 150 minutes of sweaty aerobic exercise per week.  4)Reduce stress - consider counseling, meditation and relaxation to balance other aspects of your life.

## 2017-12-30 NOTE — Addendum Note (Signed)
Addended by: Johnella MoloneyFUNDERBURK, Neelam Tiggs A on: 12/30/2017 11:59 AM   Modules accepted: Orders

## 2018-02-24 ENCOUNTER — Other Ambulatory Visit: Payer: Self-pay | Admitting: Family Medicine

## 2018-04-18 ENCOUNTER — Encounter: Payer: Self-pay | Admitting: Family Medicine

## 2018-04-18 ENCOUNTER — Ambulatory Visit: Payer: Medicare Other | Admitting: Family Medicine

## 2018-04-18 VITALS — BP 130/90 | HR 84 | Temp 98.3°F | Wt 153.4 lb

## 2018-04-18 DIAGNOSIS — J209 Acute bronchitis, unspecified: Secondary | ICD-10-CM

## 2018-04-18 DIAGNOSIS — H109 Unspecified conjunctivitis: Secondary | ICD-10-CM | POA: Diagnosis not present

## 2018-04-18 DIAGNOSIS — H9201 Otalgia, right ear: Secondary | ICD-10-CM | POA: Diagnosis not present

## 2018-04-18 MED ORDER — TOBRAMYCIN 0.3 % OP SOLN: 2.0000 [drp] | OPHTHALMIC | 0 refills | Status: DC

## 2018-04-18 NOTE — Progress Notes (Signed)
Subjective:     Patient ID: Tracey Giles, female   DOB: 09-Oct-1951, 67 y.o.   MRN: [id number]  HPI Patient seen today for following issues  Six-day history of mostly dry cough. Denies any fever or chills. No dyspnea. Minimal nasal symptoms. No sick contacts. Recently got back from [place] ,[place]. Patient is nonsmoker. No history of asthma. Using leftover cough syrup from previous visit which has helped.  Second issue is onset Saturday of some left eye itching and crusted yellowish discharge. Denies any eye injury. No blurred vision. No sinus pain or pressure. No headaches.  1 day history of right ear pain. She has history of Mnire's disease and had some kind of surgical procedure with ear previously. Denies any drainage. No fever. She also diminished hearing at baseline. She is followed regular by ENT.  Denies any Q-tip use or other foreign bodies to right ear canal  Past Medical History:  Diagnosis Date  . Essential hypertension   . History of dermatitis   . History of migraine headaches   . Hyperlipemia 05/24/2016  . Hypothyroidism   . MVP (mitral valve prolapse)    Reported history  . Palpitations    Rare PVCs by Holter monitor April 2011   . Sleep apnea    Reportedly mild  . Vitamin D insufficiency 05/24/2016   Past Surgical History:  Procedure Laterality Date  . APPENDECTOMY    . BREAST LUMPECTOMY     Benign    reports that she has never smoked. She has never used smokeless tobacco. She reports that she does not drink alcohol or use drugs. family history includes Asthma in her brother and mother; Melanoma in her father; Stroke in her maternal grandfather and mother. Allergies  Allergen Reactions  . Tums [Calcium Carbonate Antacid] Hives    Burning sensation in mouth, rash on torso  . Cephalexin   . Penicillins     REACTION: unspecified  . Sulfamethoxazole-Trimethoprim     REACTION: unspecified     Review of Systems  Constitutional: Negative  for chills and fever.  HENT: Positive for ear pain. Negative for ear discharge, sinus pressure, sinus pain and sore throat.   Eyes: Positive for discharge, redness and itching. Negative for photophobia and visual disturbance.  Respiratory: Positive for cough. Negative for shortness of breath and wheezing.   Cardiovascular: Negative for chest pain.  Neurological: Negative for dizziness and headaches.       Objective:   Physical Exam  Constitutional: She appears well-developed and well-nourished.  HENT:  Head: Normocephalic and atraumatic.  Mouth/Throat: Oropharynx is clear and moist.  Left canal is clear. She has cerumen impaction right canal. This was removed without difficulty with curette. Eardrum is very distorted and erythematous. She has small amount of blood on the surface of the eardrum but no obvious perforation  Eyes:  Right conjunctivae normal. Left conjunctiva is erythematous. She has small amount of yellowish drainage left lower lid region. Pupils equal round reactive to light. Cornea appears normal       Assessment:     #1 bacterial conjunctivitis left eye  #2 dry cough. Suspect acute viral bronchitis  #3 right otalgia. She has very distorted right eardrum which apparently is chronic as she's had prior surgery.  ?acute otitis media.    Plan:     -Tobramycin eyedrops solution 2 drops left eye every 4 hours while awake and continue warm compresses and touch base in 3 days if not resolving -Continue prescription cough medication  for symptom relief of cough. Follow-up immediately for any fever, shortness of breath or other concerns -Patient plans to contact her ENT surgeon today regarding her recent onset otalgia  Kristian Covey MD  Primary Care at [facility]

## 2018-04-18 NOTE — Patient Instructions (Signed)

## 2018-05-02 NOTE — Progress Notes (Signed)
HPI:  Using dictation device. Unfortunately this device frequently misinterprets words/phrases.  Tracey Giles is a pleasant 67 y.o. here for follow up. Chronic medical problems summarized below were reviewed for changes. Unfortunately dealing with some upper respiratory issues for 3 weeks. Nasal congestion, pnd, eye irritation, R ear issues, now burning tongue. Saw ent and treated with azithromycin and procedure to R ear for Mycoplasma.  Cough improving. Allergy issues. Not using allergy regimen. No SOB, wheezing, persistent excessive cough, hemoptysis or thick sputum,  fevers. Cough improving.Reports pulm told her only EIB. Denies CP, SOB, DOE, treatment intolerance or new symptoms. Do for thyroid lab.  AWV 09/2017  palpitations/HLD: -meds:metoprolol  Hypothyroid: -meds: levothyroxine  Osteoporosis: -she declined medications for this -taking vit D3, adequate calcium and doing wt bearing exercise -last dexa 1.2018  GERD: -meds: prilosec, pepcid  Asthma/allergies: -meds: dymista, symicort -saw pulm in 2017 for dyspena/cough dx upper airway cough syndrome   ROS: See pertinent positives and negatives per HPI.  Past Medical History:  Diagnosis Date  . Essential hypertension   . History of dermatitis   . History of migraine headaches   . Hyperlipemia 05/24/2016  . Hypothyroidism   . MVP (mitral valve prolapse)    Reported history  . Palpitations    Rare PVCs by Holter monitor April 2011   . Sleep apnea    Reportedly mild  . Vitamin D insufficiency 05/24/2016    Past Surgical History:  Procedure Laterality Date  . APPENDECTOMY    . BREAST LUMPECTOMY     Benign    Family History  Problem Relation Age of Onset  . Melanoma Father   . Stroke Mother   . Asthma Mother   . Stroke Maternal Grandfather   . Asthma Brother   . Thyroid disease Neg Hx     SOCIAL HX: see hpi   Current Outpatient Medications:  .  Azelastine-Fluticasone (DYMISTA) 137-50  MCG/ACT SUSP, Place 2 puffs into the nose daily., Disp: 1 Bottle, Rfl: 0 .  chlorpheniramine (CHLOR-TRIMETON) 4 MG tablet, Take 4 mg by mouth every 4 (four) hours as needed for allergies., Disp: , Rfl:  .  Cholecalciferol (VITAMIN D3 PO), Take 1,000 Units by mouth daily., Disp: , Rfl:  .  cyclobenzaprine (FLEXERIL) 5 MG tablet, Take 1 tablet (5 mg total) by mouth 2 (two) times daily as needed for muscle spasms., Disp: 20 tablet, Rfl: 0 .  levothyroxine (SYNTHROID, LEVOTHROID) 75 MCG tablet, TAKE 1 TABLET BY MOUTH ONCE DAILY, Disp: 90 tablet, Rfl: 1 .  LORazepam (ATIVAN) 0.5 MG tablet, Use 1/2 to 1 tablet as needed for panic attacks/anxiety, Disp: 30 tablet, Rfl: 0 .  metoprolol succinate (TOPROL-XL) 100 MG 24 hr tablet, TAKE 1/2 (ONE-HALF) TABLET BY MOUTH IN THE MORNING AND 1/2 (ONE-HALF) IN THE EVENING, Disp: 90 tablet, Rfl: 1 .  omeprazole (PRILOSEC) 20 MG capsule, Take 20 mg by mouth daily., Disp: , Rfl:  .  tobramycin (TOBREX) 0.3 % ophthalmic solution, Place 2 drops into the left eye every 4 (four) hours., Disp: 5 mL, Rfl: 0 .  albuterol (PROVENTIL HFA;VENTOLIN HFA) 108 (90 Base) MCG/ACT inhaler, Inhale 2 puffs into the lungs every 6 (six) hours as needed., Disp: 1 Inhaler, Rfl: 0 .  clotrimazole (MYCELEX) 10 MG troche, Take 1 tablet (10 mg total) by mouth 5 (five) times daily., Disp: 25 tablet, Rfl: 0  EXAM:  Vitals:   05/03/18 0855  BP: 122/82  Pulse: (!) 51  Temp: 97.9 F (36.6 C)  Body mass index is 23.95 kg/m.  GENERAL: vitals reviewed and listed above, alert, oriented, appears well hydrated and in no acute distress  HEENT: atraumatic, conjunttiva clear, no obvious abnormalities on inspection of external nose and ears, TMs with some distortion and R TM with dried blood, nasal congestion, mild erythema opst oropharynx, white discoloration tongue.  NECK: no obvious masses on inspection  LUNGS: clear to auscultation bilaterally, no wheezes, rales or rhonchi, good air  movement  CV: HRRR, no peripheral edema  MS: moves all extremities without noticeable abnormality  PSYCH: pleasant and cooperative, no obvious depression or anxiety  ASSESSMENT AND PLAN:  Discussed the following assessment and plan:  Allergic rhinitis, unspecified seasonality, unspecified trigger  Hypothyroidism, unspecified type - Plan: TSH  Upper airway cough syndrome  Thrush  -check thyroid -alb prn cough and offere cxr - opted ot hold off as she feels is improving, advised to follow up if persists -allergy regimen -follow up ENT -clot troche for thrush, advised to follow up if persists -follow up 3-4 months, sooner as needed  Patient Instructions  BEFORE YOU LEAVE: -lab -follow up: 1)3-4 months 2) AWV and CPE in October or November  Take you allergy medications.  Follow up with the ear specialist  Use the clotrimazole for the tongue issues  Follow up if cough persists  Albuterol as needed for excessive cough or night time cough  We have ordered labs or studies at this visit. It can take up to 1-2 weeks for results and processing. IF results require follow up or explanation, we will call you with instructions. Clinically stable results will be released to your [facility]. If you have not heard from [place] or cannot find your results in [facility] in 2 weeks please contact our office at [phone].  If you are not yet signed up for [facility], please consider signing up.          Terressa Koyanagi, DO

## 2018-05-03 ENCOUNTER — Ambulatory Visit: Payer: Medicare Other | Admitting: Family Medicine

## 2018-05-03 ENCOUNTER — Encounter: Payer: Self-pay | Admitting: Family Medicine

## 2018-05-03 VITALS — BP 122/82 | HR 51 | Temp 97.9°F | Ht 67.0 in | Wt 152.9 lb

## 2018-05-03 DIAGNOSIS — R05 Cough: Secondary | ICD-10-CM | POA: Diagnosis not present

## 2018-05-03 DIAGNOSIS — J309 Allergic rhinitis, unspecified: Secondary | ICD-10-CM

## 2018-05-03 DIAGNOSIS — E039 Hypothyroidism, unspecified: Secondary | ICD-10-CM | POA: Diagnosis not present

## 2018-05-03 DIAGNOSIS — B370 Candidal stomatitis: Secondary | ICD-10-CM

## 2018-05-03 DIAGNOSIS — R058 Other specified cough: Secondary | ICD-10-CM

## 2018-05-03 LAB — TSH: TSH: 5.33 u[IU]/mL — ABNORMAL HIGH (ref 0.35–4.50)

## 2018-05-03 MED ORDER — CLOTRIMAZOLE 10 MG MT TROC: 10.0000 mg | Freq: Every day | OROMUCOSAL | 0 refills | Status: DC

## 2018-05-03 MED ORDER — ALBUTEROL SULFATE HFA 108 (90 BASE) MCG/ACT IN AERS
2.0000 | INHALATION_SPRAY | Freq: Four times a day (QID) | RESPIRATORY_TRACT | 0 refills | Status: DC | PRN
Start: 2018-05-03 — End: 2019-10-10

## 2018-05-03 NOTE — Patient Instructions (Signed)
BEFORE YOU LEAVE: -lab -follow up: 1)3-4 months 2) AWV and CPE in October or November  Take you allergy medications.  Follow up with the ear specialist  Use the clotrimazole for the tongue issues  Follow up if cough persists  Albuterol as needed for excessive cough or night time cough  We have ordered labs or studies at this visit. It can take up to 1-2 weeks for results and processing. IF results require follow up or explanation, we will call you with instructions. Clinically stable results will be released to your [facility]. If you have not heard from [place] or cannot find your results in [facility] in 2 weeks please contact our office at [phone].  If you are not yet signed up for [facility], please consider signing up.

## 2018-05-05 NOTE — Addendum Note (Signed)
Addended by: Johnella Moloney on: 05/05/2018 09:24 AM   Modules accepted: Orders

## 2018-05-25 ENCOUNTER — Other Ambulatory Visit: Payer: Self-pay | Admitting: Family Medicine

## 2018-06-16 ENCOUNTER — Other Ambulatory Visit (INDEPENDENT_AMBULATORY_CARE_PROVIDER_SITE_OTHER): Payer: Medicare Other

## 2018-06-16 DIAGNOSIS — E039 Hypothyroidism, unspecified: Secondary | ICD-10-CM | POA: Diagnosis not present

## 2018-06-16 LAB — T4, FREE: Free T4: 1.16 ng/dL (ref 0.60–1.60)

## 2018-06-16 LAB — TSH: TSH: 3.14 u[IU]/mL (ref 0.35–4.50)

## 2018-08-22 ENCOUNTER — Other Ambulatory Visit: Payer: Self-pay | Admitting: Family Medicine

## 2018-08-24 ENCOUNTER — Ambulatory Visit: Payer: Medicare Other

## 2018-09-29 ENCOUNTER — Telehealth: Payer: Self-pay

## 2018-09-29 NOTE — Telephone Encounter (Signed)
Call to [facility] and left VM to call office to reschedule her AWV on the 28th.  She is scheduled to see Dr. Selena Batten the 29th and I will see her at 7:30 if she wants to come in the 29th or she can see Dr. Selena Batten and reschedule at her convenience   Montine Circle RN

## 2018-09-30 ENCOUNTER — Telehealth: Payer: Self-pay | Admitting: *Deleted

## 2018-09-30 NOTE — Telephone Encounter (Signed)
Copied from CRM [id number]. Topic: General - Other >> Sep 30, 2018  9:07 AM Percival Spanish wrote:  Pt was called because Darl Pikes had to cancel her AWV for 10/03/18 and offered her 10/04/18 at 7:30 Patient call to say she will except the 7: 30am appt on 10/04/18  Please let Darl Pikes know

## 2018-10-03 ENCOUNTER — Ambulatory Visit: Payer: Medicare Other

## 2018-10-03 NOTE — Progress Notes (Signed)
Subjective:   Tracey Giles is a 67 y.o. female who presents for Medicare Annual (Subsequent) preventive examination.  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  Advanced Directives 10/04/2018  Does Patient Have a Medical Advance Directive? No    Tobacco Social History   Tobacco Use  Smoking Status Never Smoker  Smokeless Tobacco Never Used     Counseling given: Yes  Has the paper work but has not completed   Clinical Intake:     Past Medical History:  Diagnosis Date  . Essential hypertension   . History of dermatitis   . History of migraine headaches   . Hyperlipemia 05/24/2016  . Hypothyroidism   . MVP (mitral valve prolapse)    Reported history  . Palpitations    Rare PVCs by Holter monitor April 2011   . Sleep apnea    Reportedly mild  . Vitamin D insufficiency 05/24/2016   Past Surgical History:  Procedure Laterality Date  . APPENDECTOMY    . BREAST LUMPECTOMY     Benign   Family History  Problem Relation Age of Onset  . Melanoma Father   . Stroke Tracey Giles   . Asthma Tracey Giles   . Stroke Maternal Grandfather   . Asthma Brother   . Thyroid disease Neg Hx    Social History   Socioeconomic History  . Marital status: Married    Spouse name: Not on file  .  Number of children: Not on file  . Years of education: Not on file  . Highest education level: Not on file  Occupational History  . Occupation: Development worker, international aid: [employer]  Social Needs  . Financial resource strain: Not on file  . Food insecurity:    Worry: Not on file    Inability: Not on file  . Transportation needs:    Medical: Not on file    Non-medical: Not on file  Tobacco Use  . Smoking status: Never Smoker  . Smokeless tobacco: Never Used  Substance and Sexual Activity  . Alcohol use: No  . Drug use: No  . Sexual activity: Yes  Lifestyle  . Physical activity:    Days per week: Not on file    Minutes per session: Not on file  . Stress: Not on file  Relationships  . Social connections:    Talks on phone: Not on file    Gets together: Not on file    Attends religious service: Not on file    Active member of club or organization: Not on file    Attends  meetings of clubs or organizations: Not on file    Relationship status: Not on file  Other Topics Concern  . Not on file  Social History Narrative   Work or School: retired - used to work for [employer] Situation: lives with husband      Spiritual Beliefs: Christian      Lifestyle: no regular exercise; diet is healthy       Outpatient Encounter Medications as of 10/04/2018  Medication Sig  . albuterol (PROVENTIL HFA;VENTOLIN HFA) 108 (90 Base) MCG/ACT inhaler Inhale 2 puffs into the lungs every 6 (six) hours as needed.  . Azelastine-Fluticasone (DYMISTA) 137-50 MCG/ACT SUSP Place 2 puffs into the nose daily.  . chlorpheniramine (CHLOR-TRIMETON) 4 MG tablet Take 4 mg by mouth every 4 (four) hours as needed for allergies.  . Cholecalciferol (VITAMIN D3 PO) Take 1,000 Units by mouth daily.  Marland Kitchen levothyroxine (SYNTHROID, LEVOTHROID) 75 MCG tablet TAKE 1 TABLET BY MOUTH ONCE DAILY  . metoprolol succinate (TOPROL-XL) 100 MG 24 hr tablet TAKE 1/2 (ONE-HALF) TABLET BY MOUTH IN THE MORNING AND  1/2 (ONE-HALF) IN THE EVENING  . omeprazole (PRILOSEC) 20 MG capsule Take 20 mg by mouth daily.  Marland Kitchen tobramycin (TOBREX) 0.3 % ophthalmic solution Place 2 drops into the left eye every 4 (four) hours.   No facility-administered encounter medications on file as of 10/04/2018.     Activities of Daily Living In your present state of health, do you have any difficulty performing the following activities: 10/04/2018  Hearing? N  Vision? N  Difficulty concentrating or making decisions? N  Walking or climbing stairs? N  Dressing or bathing? N  Doing errands, shopping? N  Preparing Food and eating ? N  Using the Toilet? N  In the past six months, have you accidently leaked urine? N  Do you have problems with loss of bowel control? N  Managing your Medications? N  Managing your Finances? N  Housekeeping or managing your Housekeeping? N  Some recent data might be hidden    Patient Care Team: Terressa Koyanagi, DO as PCP - General (Family Medicine) Ermalinda Barrios, MD as Consulting Physician (Otolaryngology) Cherlyn Roberts, MD as Consulting Physician (Dermatology)    Assessment:   This is a routine wellness examination for Tracey Giles.  Exercise Activities and Dietary recommendations    Goals    . Patient Stated     Continue to keep good skills for mental health and voluteer        Fall Risk Fall Risk  10/04/2018 12/11/2016  Falls in the past year? No No    Depression Screen PHQ 2/9 Scores 10/04/2018 09/30/2017 12/11/2016  PHQ - 2 Score 0 0 0     Cognitive Function MMSE - Mini Mental State Exam 10/04/2018  Not completed: (No Data)   Ad8 score reviewed for issues:  Issues making decisions:  Less interest in hobbies / activities:  Repeats questions, stories (family complaining):  Trouble using ordinary gadgets (microwave, computer, phone):  Forgets the month or year:   Mismanaging finances:   Remembering appts:  Daily problems with thinking and/or memory: Ad8 score  is=0 Keep s good list          Immunization History  Administered Date(s) Administered  . Influenza Split 09/21/2011, 09/05/2012  . Influenza Whole 10/07/2001, 09/23/2007, 09/06/2008, 10/10/2009  . Influenza,inj,Quad PF,6+ Mos 10/09/2013, 09/06/2014, 08/14/2016  . Influenza-Unspecified 09/19/2018  . Pneumococcal Conjugate-13 12/11/2016  . Pneumococcal Polysaccharide-23 12/30/2017  .  Td 02/04/1998, 09/23/2007, 09/30/2017  . Zoster 06/03/2012      Screening Tests Health Maintenance  Topic Date Due  . DEXA SCAN  12/18/2018  . MAMMOGRAM  11/02/2019  . Fecal DNA (Cologuard)  10/12/2020  . TETANUS/TDAP  10/01/2027  . INFLUENZA VACCINE  Completed  . PNA vac Low Risk Adult  Completed  . 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 Takes BP with meds   Referrals  None  Patient concerns; none  Nurse Concerns; Doing well, taking care of disabled spouse and Tracey Giles who is 58; lives next door   Next PCP apt Seeing Dr. Selena Batten today      I have personally reviewed and noted the following in the patient's chart:   . Medical and social history . Use of alcohol, tobacco or illicit drugs  . Current medications and supplements . Functional ability and status . Nutritional status . Physical activity . Advanced directives . List of other physicians . Hospitalizations, surgeries, and ER visits in previous 12 months . Vitals . Screenings to include cognitive, depression, and falls . Referrals and appointments  In addition, I have reviewed and discussed with patient certain preventive protocols, quality metrics, and best practice recommendations. A written  personalized care plan for preventive services as well as general preventive health recommendations were provided to patient.     Montine Circle, RN  10/04/2018

## 2018-10-03 NOTE — Progress Notes (Signed)
HPI:  Using dictation device. Unfortunately this device frequently misinterprets words/phrases.  Here for CPE:  -Concerns and/or follow up today:   Tracey Giles is a pleasant 67 y.o. here for follow up and her Physical. Chronic medical problems summarized below were reviewed for changes and stability and were updated as needed below. These issues and their treatment remain stable for the most part. No complaints today. Denies CP, SOB, DOE, treatment intolerance or new symptoms. Did not take her medication yet this am and BP was mildly elevated. Due for flu vaccine, labs (tsh, lipids, hgba1c)  AWV 09/2018 (today) with Tracey Giles  palpitations/HLD: -meds:metoprolol -feels well, no CP, palpitations or complains reported  Hypothyroid: -meds: levothyroxine -taking  Osteoporosis: -she declined medications for this -taking vit D3, adequate calcium and doing wt bearing exercise -last dexa 1.2018  GERD: -meds: prilosec  Asthma/allergies: -meds: taking claritin and flonase -saw pulm in 2017 for dyspena/cough dx upper airway cough syndrome   -Diet: variety of foods, balance and well rounded, larger portion sizes -Exercise: no regular exercise -Taking folic acid, vitamin D or calcium:taking vit d -Diabetes and Dyslipidemia Screening: fasting for labs -Vaccines: see vaccine section Epic - did flu vaccine, wants new shingles vaccine -pap history: n/a -FDLMP: see nursing notes -sexual activity: not discussed -wants STI testing (Hep C if born [birth year]): no -FH breast, colon or ovarian ca: see FH Last mammogram:  Done 10/2017 [facility] Last colon cancer screening: cologuard done 2018 Breast Ca Risk Assessment: see family history and pt history DEXA (>/= 65): done 12/2016  -Alcohol, Tobacco, drug use: see social history  Review of Systems - no reported  fevers, unintentional weight loss, vision loss, hearing loss, chest pain, sob, hemoptysis, melena, hematochezia,  hematuria, genital discharge, changing or concerning skin lesions, bleeding, bruising, loc, thoughts of self harm or SI  Past Medical History:  Diagnosis Date  . Essential hypertension   . History of dermatitis   . History of migraine headaches   . Hyperlipemia 05/24/2016  . Hypothyroidism   . MVP (mitral valve prolapse)    Reported history  . Palpitations    Rare PVCs by Holter monitor April 2011   . Sleep apnea    Reportedly mild  . Vitamin D insufficiency 05/24/2016    Past Surgical History:  Procedure Laterality Date  . APPENDECTOMY    . BREAST LUMPECTOMY     Benign    Family History  Problem Relation Age of Onset  . Melanoma Father   . Stroke Mother   . Asthma Mother   . Stroke Maternal Grandfather   . Asthma Brother   . Thyroid disease Neg Hx     Social History   Socioeconomic History  . Marital status: Married    Spouse name: Not on file  . Number of children: Not on file  . Years of education: Not on file  . Highest education level: Not on file  Occupational History  . Occupation: Landscape architect: [employer]  . Financial resource strain: Not on file  . Food insecurity:    Worry: Not on file    Inability: Not on file  . Transportation needs:    Medical: Not on file    Non-medical: Not on file  Tobacco Use  . Smoking status: Never Smoker  . Smokeless tobacco: Never Used  Substance and Sexual Activity  . Alcohol use: No  . Drug use: No  . Sexual activity: Yes  Lifestyle  .  Physical activity:    Days per week: Not on file    Minutes per session: Not on file  . Stress: Not on file  Relationships  . Social connections:    Talks on phone: Not on file    Gets together: Not on file    Attends religious service: Not on file    Active member of club or organization: Not on file    Attends meetings of clubs or organizations: Not on file    Relationship status: Not on file  Other Topics Concern  . Not on file  Social History  Narrative   Work or School: retired - used to work for [employer] Situation: lives with husband      Spiritual Beliefs: Christian      Lifestyle: no regular exercise; diet is healthy        Current Outpatient Medications:  .  albuterol (PROVENTIL HFA;VENTOLIN HFA) 108 (90 Base) MCG/ACT inhaler, Inhale 2 puffs into the lungs every 6 (six) hours as needed., Disp: 1 Inhaler, Rfl: 0 .  Cholecalciferol (VITAMIN D3 PO), Take 1,000 Units by mouth daily., Disp: , Rfl:  .  levothyroxine (SYNTHROID, LEVOTHROID) 75 MCG tablet, TAKE 1 TABLET BY MOUTH ONCE DAILY, Disp: 90 tablet, Rfl: 1 .  metoprolol succinate (TOPROL-XL) 100 MG 24 hr tablet, TAKE 1/2 (ONE-HALF) TABLET BY MOUTH IN THE MORNING AND 1/2 (ONE-HALF) IN THE EVENING, Disp: 90 tablet, Rfl: 1 .  omeprazole (PRILOSEC) 20 MG capsule, Take 20 mg by mouth daily., Disp: , Rfl:  .  tobramycin (TOBREX) 0.3 % ophthalmic solution, Place 2 drops into the left eye every 4 (four) hours., Disp: 5 mL, Rfl: 0  EXAM:  Vitals:   10/04/18 0747  BP: 122/78  Pulse: (!) 58  Temp: 97.8 F (36.6 C)    GENERAL: vitals reviewed and listed below, alert, oriented, appears well hydrated and in no acute distress  HEENT: head atraumatic, PERRLA, normal appearance of eyes, ears, nose and mouth. moist mucus membranes.  NECK: supple, no masses or lymphadenopathy  LUNGS: clear to auscultation bilaterally, no rales, rhonchi or wheeze  CV: HRRR, no peripheral edema or cyanosis, normal pedal pulses  ABDOMEN: bowel sounds normal, soft, non tender to palpation, no masses, no rebound or guarding  GU/BREAST: declined  SKIN: no rash or abnormal lesions, declined full skin exam   MS: normal gait, moves all extremities normally  NEURO: normal gait, speech and thought processing grossly intact, muscle tone grossly intact throughout  PSYCH: normal affect, pleasant and cooperative  ASSESSMENT AND PLAN:  Discussed the following assessment  and plan:  PREVENTIVE EXAM: -Discussed and advised all [place] preventive services health task force level A and B recommendations for age, sex and risks. -Advised at least 150 minutes of exercise per week and a healthy diet with avoidance of (less then 1 serving per week) processed foods, white starches, red meat, fast foods and sweets and consisting of: * 5-9 servings of fresh fruits and vegetables (not corn or potatoes) *nuts and seeds, beans *olives and olive oil *lean meats such as fish and white chicken  *whole grains -labs, studies and vaccines per orders this encounter - Hemoglobin A1c  1. Hyperlipidemia, unspecified hyperlipidemia type - Lipid panel  2. Palpitations -on BB, no complaints  3. Hypothyroidism, unspecified type -on synthroid, no compliants - TSH  4. Vitamin D insufficiency -taking vit d3  5. Osteoporosis, unspecified osteoporosis type, unspecified pathological fracture presence -regular exercise, taking  vit D3, dexa utd  6. Allergic rhinitis, unspecified seasonality, unspecified trigger -doing well per pt     Patient advised to return to clinic immediately if symptoms worsen or persist or new concerns.  Patient Instructions  BEFORE YOU LEAVE: -new shingles vaccine -labs -follow up: 3-4 months  We have ordered labs or studies at this visit. It can take up to 1-2 weeks for results and processing. IF results require follow up or explanation, we will call you with instructions. Clinically stable results will be released to your [facility]. If you have not heard from [place] or cannot find your results in [facility] in 2 weeks please contact our office at [phone].  If you are not yet signed up for [facility], please consider signing up.   Preventive Care 102 Years and Older, Female Preventive care refers to lifestyle choices and visits with your health care provider that can promote health and wellness. What does preventive care include?  A yearly physical exam.  This is also called an annual well check.  Dental exams once or twice a year.  Routine eye exams. Ask your health care provider how often you should have your eyes checked.  Personal lifestyle choices, including: ? Daily care of your teeth and gums. ? Regular physical activity. ? Eating a healthy diet. ? Avoiding tobacco and drug use. ? Limiting alcohol use. ? Practicing safe sex. ? Taking vitamin and mineral supplements as recommended by your health care provider. What happens during an annual well check? The services and screenings done by your health care provider during your annual well check will depend on your age, overall health, lifestyle risk factors, and family history of disease. Counseling Your health care provider may ask you questions about your:  Alcohol use.  Tobacco use.  Drug use.  Emotional well-being.  Home and relationship well-being.  Sexual activity.  Eating habits.  History of falls.  Memory and ability to understand (cognition).  Work and work Statistician.  Reproductive health.  Screening You may have the following tests or measurements:  Height, weight, and BMI.  Blood pressure.  Lipid and cholesterol levels. These may be checked every 5 years, or more frequently if you are over 13 years old.  Skin check.  Lung cancer screening. You may have this screening every year starting at age 35 if you have a 30-pack-year history of smoking and currently smoke or have quit within the past 15 years.  Fecal occult blood test (FOBT) of the stool. You may have this test every year starting at age 31.  Flexible sigmoidoscopy or colonoscopy. You may have a sigmoidoscopy every 5 years or a colonoscopy every 10 years starting at age 27.  Hepatitis C blood test.  Hepatitis B blood test.  Sexually transmitted disease (STD) testing.  Diabetes screening. This is done by checking your blood sugar (glucose) after you have not eaten for a while  (fasting). You may have this done every 1-3 years.  Bone density scan. This is done to screen for osteoporosis. You may have this done starting at age 36.  Mammogram. This may be done every 1-2 years. Talk to your health care provider about how often you should have regular mammograms.  Talk with your health care provider about your test results, treatment options, and if necessary, the need for more tests. Vaccines Your health care provider may recommend certain vaccines, such as:  Influenza vaccine. This is recommended every year.  Tetanus, diphtheria, and acellular pertussis (Tdap, Td) vaccine. You  may need a Td booster every 10 years.  Varicella vaccine. You may need this if you have not been vaccinated.  Zoster vaccine. You may need this after age 21.  Measles, mumps, and rubella (MMR) vaccine. You may need at least one dose of MMR if you were born in 1957 or later. You may also need a second dose.  Pneumococcal 13-valent conjugate (PCV13) vaccine. One dose is recommended after age 84.  Pneumococcal polysaccharide (PPSV23) vaccine. One dose is recommended after age 33.  Meningococcal vaccine. You may need this if you have certain conditions.  Hepatitis A vaccine. You may need this if you have certain conditions or if you travel or work in places where you may be exposed to hepatitis A.  Hepatitis B vaccine. You may need this if you have certain conditions or if you travel or work in places where you may be exposed to hepatitis B.  Haemophilus influenzae type b (Hib) vaccine. You may need this if you have certain conditions.  Talk to your health care provider about which screenings and vaccines you need and how often you need them. This information is not intended to replace advice given to you by your health care provider. Make sure you discuss any questions you have with your health care provider. Document Released: 12/20/2015 Document Revised: 08/12/2016 Document Reviewed:  09/24/2015 Elsevier Interactive Patient Education  2018 [employer].          No follow-ups on file.  Lucretia Kern, DO

## 2018-10-04 ENCOUNTER — Encounter: Payer: Self-pay | Admitting: Family Medicine

## 2018-10-04 ENCOUNTER — Ambulatory Visit (INDEPENDENT_AMBULATORY_CARE_PROVIDER_SITE_OTHER): Payer: Medicare Other | Admitting: Family Medicine

## 2018-10-04 ENCOUNTER — Ambulatory Visit (INDEPENDENT_AMBULATORY_CARE_PROVIDER_SITE_OTHER): Payer: Medicare Other

## 2018-10-04 VITALS — BP 164/84 | HR 58 | Ht 68.0 in | Wt 155.6 lb

## 2018-10-04 VITALS — BP 122/78 | HR 58 | Temp 97.8°F | Ht 68.0 in | Wt 155.9 lb

## 2018-10-04 DIAGNOSIS — R002 Palpitations: Secondary | ICD-10-CM

## 2018-10-04 DIAGNOSIS — E785 Hyperlipidemia, unspecified: Secondary | ICD-10-CM | POA: Diagnosis not present

## 2018-10-04 DIAGNOSIS — E559 Vitamin D deficiency, unspecified: Secondary | ICD-10-CM | POA: Diagnosis not present

## 2018-10-04 DIAGNOSIS — M810 Age-related osteoporosis without current pathological fracture: Secondary | ICD-10-CM

## 2018-10-04 DIAGNOSIS — E039 Hypothyroidism, unspecified: Secondary | ICD-10-CM | POA: Diagnosis not present

## 2018-10-04 DIAGNOSIS — Z0000 Encounter for general adult medical examination without abnormal findings: Secondary | ICD-10-CM | POA: Diagnosis not present

## 2018-10-04 DIAGNOSIS — J309 Allergic rhinitis, unspecified: Secondary | ICD-10-CM

## 2018-10-04 LAB — TSH: TSH: 2.47 u[IU]/mL (ref 0.35–4.50)

## 2018-10-04 LAB — LIPID PANEL
CHOLESTEROL: 183 mg/dL (ref 0–200)
HDL: 47.7 mg/dL (ref >39.00)
LDL Cholesterol: 110 mg/dL — ABNORMAL HIGH (ref 0–99)
NonHDL: 135.69
Total CHOL/HDL Ratio: 4
Triglycerides: 130 mg/dL (ref 0.0–149.0)
VLDL: 26 mg/dL (ref 0.0–40.0)

## 2018-10-04 LAB — HEMOGLOBIN A1C: HEMOGLOBIN A1C: 5.6 % (ref 4.6–6.5)

## 2018-10-04 NOTE — Patient Instructions (Signed)
Tracey Giles , Thank you for taking time to come for your Medicare Wellness Visit. I appreciate your ongoing commitment to your health goals. Please review the following plan we discussed and let me know if I can assist you in the future.   Good to see you. Thanks for coming in !  Will repeat mammogram this year.  Keep walking as much as possible throughout the day !   These are the goals we discussed: Goals    . Patient Stated     Continue to keep good skills for mental health and voluteer        This is a list of the screening recommended for you and due dates:  Health Maintenance  Topic Date Due  . DEXA scan (bone density measurement)  12/18/2018  . Mammogram  11/02/2019  . Cologuard (Stool DNA test)  10/12/2020  . Tetanus Vaccine  10/01/2027  . Flu Shot  Completed  . Pneumonia vaccines  Completed  .  Hepatitis C: One time screening is recommended by Center for Disease Control  (CDC) for  adults born from [birth year] through 1965.   Addressed    Health Maintenance, Female Adopting a healthy lifestyle and getting preventive care can go a long way to promote health and wellness. Talk with your health care provider about what schedule of regular examinations is right for you. This is a good chance for you to check in with your provider about disease prevention and staying healthy. In between checkups, there are plenty of things you can do on your own. Experts have done a lot of research about which lifestyle changes and preventive measures are most likely to keep you healthy. Ask your health care provider for more information. Weight and diet Eat a healthy diet  Be sure to include plenty of vegetables, fruits, low-fat dairy products, and lean protein.  Do not eat a lot of foods high in solid fats, added sugars, or salt.  Get regular exercise. This is one of the most important things you can do for your health. ? Most adults should exercise for at least 150 minutes each week. The  exercise should increase your heart rate and make you sweat (moderate-intensity exercise). ? Most adults should also do strengthening exercises at least twice a week. This is in addition to the moderate-intensity exercise.  Maintain a healthy weight  Body mass index (BMI) is a measurement that can be used to identify possible weight problems. It estimates body fat based on height and weight. Your health care provider can help determine your BMI and help you achieve or maintain a healthy weight.  For females 51 years of age and older: ? A BMI below 18.5 is considered underweight. ? A BMI of 18.5 to 24.9 is normal. ? A BMI of 25 to 29.9 is considered overweight. ? A BMI of 30 and above is considered obese.  Watch levels of cholesterol and blood lipids  You should start having your blood tested for lipids and cholesterol at 67 years of age, then have this test every 5 years.  You may need to have your cholesterol levels checked more often if: ? Your lipid or cholesterol levels are high. ? You are older than 67 years of age. ? You are at high risk for heart disease.  Cancer screening Lung Cancer  Lung cancer screening is recommended for adults 1-14 years old who are at high risk for lung cancer because of a history of smoking.  A yearly low-dose  CT scan of the lungs is recommended for people who: ? Currently smoke. ? Have quit within the past 15 years. ? Have at least a 30-pack-year history of smoking. A pack year is smoking an average of one pack of cigarettes a day for 1 year.  Yearly screening should continue until it has been 15 years since you quit.  Yearly screening should stop if you develop a health problem that would prevent you from having lung cancer treatment.  Breast Cancer  Practice breast self-awareness. This means understanding how your breasts normally appear and feel.  It also means doing regular breast self-exams. Let your health care provider know about any  changes, no matter how small.  If you are in your 20s or 30s, you should have a clinical breast exam (CBE) by a health care provider every 1-3 years as part of a regular health exam.  If you are 54 or older, have a CBE every year. Also consider having a breast X-ray (mammogram) every year.  If you have a family history of breast cancer, talk to your health care provider about genetic screening.  If you are at high risk for breast cancer, talk to your health care provider about having an MRI and a mammogram every year.  Breast cancer gene (BRCA) assessment is recommended for women who have family members with BRCA-related cancers. BRCA-related cancers include: ? Breast. ? Ovarian. ? Tubal. ? Peritoneal cancers.  Results of the assessment will determine the need for genetic counseling and BRCA1 and BRCA2 testing.  Cervical Cancer Your health care provider may recommend that you be screened regularly for cancer of the pelvic organs (ovaries, uterus, and vagina). This screening involves a pelvic examination, including checking for microscopic changes to the surface of your cervix (Pap test). You may be encouraged to have this screening done every 3 years, beginning at age 18.  For women ages 63-65, health care providers may recommend pelvic exams and Pap testing every 3 years, or they may recommend the Pap and pelvic exam, combined with testing for human papilloma virus (HPV), every 5 years. Some types of HPV increase your risk of cervical cancer. Testing for HPV may also be done on women of any age with unclear Pap test results.  Other health care providers may not recommend any screening for nonpregnant women who are considered low risk for pelvic cancer and who do not have symptoms. Ask your health care provider if a screening pelvic exam is right for you.  If you have had past treatment for cervical cancer or a condition that could lead to cancer, you need Pap tests and screening for cancer  for at least 20 years after your treatment. If Pap tests have been discontinued, your risk factors (such as having a new sexual partner) need to be reassessed to determine if screening should resume. Some women have medical problems that increase the chance of getting cervical cancer. In these cases, your health care provider may recommend more frequent screening and Pap tests.  Colorectal Cancer  This type of cancer can be detected and often prevented.  Routine colorectal cancer screening usually begins at 67 years of age and continues through 67 years of age.  Your health care provider may recommend screening at an earlier age if you have risk factors for colon cancer.  Your health care provider may also recommend using home test kits to check for hidden blood in the stool.  A small camera at the end of a tube  can be used to examine your colon directly (sigmoidoscopy or colonoscopy). This is done to check for the earliest forms of colorectal cancer.  Routine screening usually begins at age 19.  Direct examination of the colon should be repeated every 5-10 years through 67 years of age. However, you may need to be screened more often if early forms of precancerous polyps or small growths are found.  Skin Cancer  Check your skin from head to toe regularly.  Tell your health care provider about any new moles or changes in moles, especially if there is a change in a mole's shape or color.  Also tell your health care provider if you have a mole that is larger than the size of a pencil eraser.  Always use sunscreen. Apply sunscreen liberally and repeatedly throughout the day.  Protect yourself by wearing long sleeves, pants, a wide-brimmed hat, and sunglasses whenever you are outside.  Heart disease, diabetes, and high blood pressure  High blood pressure causes heart disease and increases the risk of stroke. High blood pressure is more likely to develop in: ? People who have blood  pressure in the high end of the normal range (130-139/85-89 mm Hg). ? People who are overweight or obese. ? People who are African American.  If you are 74-6 years of age, have your blood pressure checked every 3-5 years. If you are 96 years of age or older, have your blood pressure checked every year. You should have your blood pressure measured twice-once when you are at a hospital or clinic, and once when you are not at a hospital or clinic. Record the average of the two measurements. To check your blood pressure when you are not at a hospital or clinic, you can use: ? An automated blood pressure machine at a pharmacy. ? A home blood pressure monitor.  If you are between 26 years and 77 years old, ask your health care provider if you should take aspirin to prevent strokes.  Have regular diabetes screenings. This involves taking a blood sample to check your fasting blood sugar level. ? If you are at a normal weight and have a low risk for diabetes, have this test once every three years after 67 years of age. ? If you are overweight and have a high risk for diabetes, consider being tested at a younger age or more often. Preventing infection Hepatitis B  If you have a higher risk for hepatitis B, you should be screened for this virus. You are considered at high risk for hepatitis B if: ? You were born in a country where hepatitis B is common. Ask your health care provider which countries are considered high risk. ? Your parents were born in a high-risk country, and you have not been immunized against hepatitis B (hepatitis B vaccine). ? You have HIV or AIDS. ? You use needles to inject street drugs. ? You live with someone who has hepatitis B. ? You have had sex with someone who has hepatitis B. ? You get hemodialysis treatment. ? You take certain medicines for conditions, including cancer, organ transplantation, and autoimmune conditions.  Hepatitis C  Blood testing is recommended  for: ? Everyone born from [birth year] through 1965. ? Anyone with known risk factors for hepatitis C.  Sexually transmitted infections (STIs)  You should be screened for sexually transmitted infections (STIs) including gonorrhea and chlamydia if: ? You are sexually active and are younger than 67 years of age. ? You are older than 67  years of age and your health care provider tells you that you are at risk for this type of infection. ? Your sexual activity has changed since you were last screened and you are at an increased risk for chlamydia or gonorrhea. Ask your health care provider if you are at risk.  If you do not have HIV, but are at risk, it may be recommended that you take a prescription medicine daily to prevent HIV infection. This is called pre-exposure prophylaxis (PrEP). You are considered at risk if: ? You are sexually active and do not regularly use condoms or know the HIV status of your partner(s). ? You take drugs by injection. ? You are sexually active with a partner who has HIV.  Talk with your health care provider about whether you are at high risk of being infected with HIV. If you choose to begin PrEP, you should first be tested for HIV. You should then be tested every 3 months for as long as you are taking PrEP. Pregnancy  If you are premenopausal and you may become pregnant, ask your health care provider about preconception counseling.  If you may become pregnant, take 400 to 800 micrograms (mcg) of folic acid every day.  If you want to prevent pregnancy, talk to your health care provider about birth control (contraception). Osteoporosis and menopause  Osteoporosis is a disease in which the bones lose minerals and strength with aging. This can result in serious bone fractures. Your risk for osteoporosis can be identified using a bone density scan.  If you are 74 years of age or older, or if you are at risk for osteoporosis and fractures, ask your health care provider if  you should be screened.  Ask your health care provider whether you should take a calcium or vitamin D supplement to lower your risk for osteoporosis.  Menopause may have certain physical symptoms and risks.  Hormone replacement therapy may reduce some of these symptoms and risks. Talk to your health care provider about whether hormone replacement therapy is right for you. Follow these instructions at home:  Schedule regular health, dental, and eye exams.  Stay current with your immunizations.  Do not use any tobacco products including cigarettes, chewing tobacco, or electronic cigarettes.  If you are pregnant, do not drink alcohol.  If you are breastfeeding, limit how much and how often you drink alcohol.  Limit alcohol intake to no more than 1 drink per day for nonpregnant women. One drink equals 12 ounces of beer, 5 ounces of wine, or 1 ounces of hard liquor.  Do not use street drugs.  Do not share needles.  Ask your health care provider for help if you need support or information about quitting drugs.  Tell your health care provider if you often feel depressed.  Tell your health care provider if you have ever been abused or do not feel safe at home. This information is not intended to replace advice given to you by your health care provider. Make sure you discuss any questions you have with your health care provider. Document Released: 06/08/2011 Document Revised: 04/30/2016 Document Reviewed: 08/27/2015 Elsevier Interactive Patient Education  2018 [place] in the Home Falls can cause injuries. They can happen to people of all ages. There are many things you can do to make your home safe and to help prevent falls. What can I do on the outside of my home?  Regularly fix the edges of walkways and  driveways and fix any cracks.  Remove anything that might make you trip as you walk through a door, such as a raised step or threshold.  Trim any  bushes or trees on the path to your home.  Use bright outdoor lighting.  Clear any walking paths of anything that might make someone trip, such as rocks or tools.  Regularly check to see if handrails are loose or broken. Make sure that both sides of any steps have handrails.  Any raised decks and porches should have guardrails on the edges.  Have any leaves, snow, or ice cleared regularly.  Use sand or salt on walking paths during winter.  Clean up any spills in your garage right away. This includes oil or grease spills. What can I do in the bathroom?  Use night lights.  Install grab bars by the toilet and in the tub and shower. Do not use towel bars as grab bars.  Use non-skid mats or decals in the tub or shower.  If you need to sit down in the shower, use a plastic, non-slip stool.  Keep the floor dry. Clean up any water that spills on the floor as soon as it happens.  Remove soap buildup in the tub or shower regularly.  Attach bath mats securely with double-sided non-slip rug tape.  Do not have throw rugs and other things on the floor that can make you trip. What can I do in the bedroom?  Use night lights.  Make sure that you have a light by your bed that is easy to reach.  Do not use any sheets or blankets that are too big for your bed. They should not hang down onto the floor.  Have a firm chair that has side arms. You can use this for support while you get dressed.  Do not have throw rugs and other things on the floor that can make you trip. What can I do in the kitchen?  Clean up any spills right away.  Avoid walking on wet floors.  Keep items that you use a lot in easy-to-reach places.  If you need to reach something above you, use a strong step stool that has a grab bar.  Keep electrical cords out of the way.  Do not use floor polish or wax that makes floors slippery. If you must use wax, use non-skid floor wax.  Do not have throw rugs and other  things on the floor that can make you trip. What can I do with my stairs?  Do not leave any items on the stairs.  Make sure that there are handrails on both sides of the stairs and use them. Fix handrails that are broken or loose. Make sure that handrails are as long as the stairways.  Check any carpeting to make sure that it is firmly attached to the stairs. Fix any carpet that is loose or worn.  Avoid having throw rugs at the top or bottom of the stairs. If you do have throw rugs, attach them to the floor with carpet tape.  Make sure that you have a light switch at the top of the stairs and the bottom of the stairs. If you do not have them, ask someone to add them for you. What else can I do to help prevent falls?  Wear shoes that: ? Do not have high heels. ? Have rubber bottoms. ? Are comfortable and fit you well. ? Are closed at the toe. Do not wear sandals.  If you use a stepladder: ? Make sure that it is fully opened. Do not climb a closed stepladder. ? Make sure that both sides of the stepladder are locked into place. ? Ask someone to hold it for you, if possible.  Clearly mark and make sure that you can see: ? Any grab bars or handrails. ? First and last steps. ? Where the edge of each step is.  Use tools that help you move around (mobility aids) if they are needed. These include: ? Canes. ? Walkers. ? Scooters. ? Crutches.  Turn on the lights when you go into a dark area. Replace any light bulbs as soon as they burn out.  Set up your furniture so you have a clear path. Avoid moving your furniture around.  If any of your floors are uneven, fix them.  If there are any pets around you, be aware of where they are.  Review your medicines with your doctor. Some medicines can make you feel dizzy. This can increase your chance of falling. Ask your doctor what other things that you can do to help prevent falls. This information is not intended to replace advice given to  you by your health care provider. Make sure you discuss any questions you have with your health care provider. Document Released: 09/19/2009 Document Revised: 04/30/2016 Document Reviewed: 12/28/2014 Elsevier Interactive Patient Education  [employer].

## 2018-10-04 NOTE — Patient Instructions (Addendum)
BEFORE YOU LEAVE: -new shingles vaccine -labs -follow up: 3-4 months  We have ordered labs or studies at this visit. It can take up to 1-2 weeks for results and processing. IF results require follow up or explanation, we will call you with instructions. Clinically stable results will be released to your MYCHART. If you have not heard from us or cannot find your results in MYCHART in 2 weeks please contact our office at [phone].  If you are not yet signed up for MYCHART, please consider signing up.   Preventive Care 65 Years and Older, Female Preventive care refers to lifestyle choices and visits with your health care provider that can promote health and wellness. What does preventive care include?  A yearly physical exam. This is also called an annual well check.  Dental exams once or twice a year.  Routine eye exams. Ask your health care provider how often you should have your eyes checked.  Personal lifestyle choices, including: ? Daily care of your teeth and gums. ? Regular physical activity. ? Eating a healthy diet. ? Avoiding tobacco and drug use. ? Limiting alcohol use. ? Practicing safe sex. ? Taking vitamin and mineral supplements as recommended by your health care provider. What happens during an annual well check? The services and screenings done by your health care provider during your annual well check will depend on your age, overall health, lifestyle risk factors, and family history of disease. Counseling Your health care provider may ask you questions about your:  Alcohol use.  Tobacco use.  Drug use.  Emotional well-being.  Home and relationship well-being.  Sexual activity.  Eating habits.  History of falls.  Memory and ability to understand (cognition).  Work and work environment.  Reproductive health.  Screening You may have the following tests or measurements:  Height, weight, and BMI.  Blood pressure.  Lipid and cholesterol  levels. These may be checked every 5 years, or more frequently if you are over 50 years old.  Skin check.  Lung cancer screening. You may have this screening every year starting at age 55 if you have a 30-pack-year history of smoking and currently smoke or have quit within the past 15 years.  Fecal occult blood test (FOBT) of the stool. You may have this test every year starting at age 50.  Flexible sigmoidoscopy or colonoscopy. You may have a sigmoidoscopy every 5 years or a colonoscopy every 10 years starting at age 50.  Hepatitis C blood test.  Hepatitis B blood test.  Sexually transmitted disease (STD) testing.  Diabetes screening. This is done by checking your blood sugar (glucose) after you have not eaten for a while (fasting). You may have this done every 1-3 years.  Bone density scan. This is done to screen for osteoporosis. You may have this done starting at age 65.  Mammogram. This may be done every 1-2 years. Talk to your health care provider about how often you should have regular mammograms.  Talk with your health care provider about your test results, treatment options, and if necessary, the need for more tests. Vaccines Your health care provider may recommend certain vaccines, such as:  Influenza vaccine. This is recommended every year.  Tetanus, diphtheria, and acellular pertussis (Tdap, Td) vaccine. You may need a Td booster every 10 years.  Varicella vaccine. You may need this if you have not been vaccinated.  Zoster vaccine. You may need this after age 60.  Measles, mumps, and rubella (MMR) vaccine. You may   need at least one dose of MMR if you were born in 1957 or later. You may also need a second dose.  Pneumococcal 13-valent conjugate (PCV13) vaccine. One dose is recommended after age 65.  Pneumococcal polysaccharide (PPSV23) vaccine. One dose is recommended after age 65.  Meningococcal vaccine. You may need this if you have certain  conditions.  Hepatitis A vaccine. You may need this if you have certain conditions or if you travel or work in places where you may be exposed to hepatitis A.  Hepatitis B vaccine. You may need this if you have certain conditions or if you travel or work in places where you may be exposed to hepatitis B.  Haemophilus influenzae type b (Hib) vaccine. You may need this if you have certain conditions.  Talk to your health care provider about which screenings and vaccines you need and how often you need them. This information is not intended to replace advice given to you by your health care provider. Make sure you discuss any questions you have with your health care provider. Document Released: 12/20/2015 Document Revised: 08/12/2016 Document Reviewed: 09/24/2015 Elsevier Interactive Patient Education  2018 [employer]        

## 2018-10-06 NOTE — Progress Notes (Signed)
Lynsay Fesperman R Vandy Fong, DO  

## 2018-11-24 ENCOUNTER — Other Ambulatory Visit: Payer: Self-pay | Admitting: *Deleted

## 2018-11-24 MED ORDER — METOPROLOL SUCCINATE ER 100 MG PO TB24: ORAL_TABLET | ORAL | 1 refills | Status: DC

## 2018-11-24 NOTE — Telephone Encounter (Signed)
Rx done. 

## 2018-12-21 LAB — HM MAMMOGRAPHY

## 2019-01-04 ENCOUNTER — Ambulatory Visit: Payer: Medicare Other | Admitting: Cardiology

## 2019-01-04 ENCOUNTER — Encounter

## 2019-01-04 ENCOUNTER — Telehealth: Payer: Self-pay | Admitting: Cardiology

## 2019-01-04 ENCOUNTER — Encounter: Payer: Self-pay | Admitting: Cardiology

## 2019-01-04 VITALS — BP 136/62 | HR 64 | Ht 67.0 in | Wt 154.0 lb

## 2019-01-04 DIAGNOSIS — E039 Hypothyroidism, unspecified: Secondary | ICD-10-CM

## 2019-01-04 DIAGNOSIS — R002 Palpitations: Secondary | ICD-10-CM | POA: Diagnosis not present

## 2019-01-04 DIAGNOSIS — R072 Precordial pain: Secondary | ICD-10-CM | POA: Diagnosis not present

## 2019-01-04 DIAGNOSIS — J45990 Exercise induced bronchospasm: Secondary | ICD-10-CM

## 2019-01-04 NOTE — Progress Notes (Signed)
HPI:  Using dictation device. Unfortunately this device frequently misinterprets words/phrases.  Tracey FischerCatherine M Giles is a pleasant 68 y.o. here for follow up. Chronic medical problems summarized below were reviewed for changes and stability and were updated as needed below.  Had bone denisty test. See results in epic. Decreased BMD in the AP spine. Hips stable. Patient does not want to take medications. Taking some vit D. No exercise. Thyroid treated and on low dose ppi. Denies CP, SOB, DOE, treatment intolerance or new symptoms.  AWV 09/2018  with Bartholomew BoardsSusan Hauke  palpitations/HLD: -meds:metoprolol -feels well, no CP, palpitations or complains reported  Hypothyroid: -meds: levothyroxine -hx possible thyroid/neck swelling, saw endocrinologist and ENT and was told no further evaluation needed; at one point specialist referred her for biopsy thyroid nodules, but then this procedure was canceled as repeat imaging 12/31/2016 with interventional radiology did not show any nodules. She then saw ENT per her report and also was told no concern.  AWV 09/2018 with Darl PikesSusan  Osteoporosis: -she declined medications for this -taking vit D3, adequate calcium and doing wt bearing exercise -last dexa 1.2018  GERD: -meds: prilosec  Asthma/allergies: -meds: taking claritin and flonase -saw pulm in 2017 for dyspena/cough dx upper airway cough syndrome  ROS: See pertinent positives and negatives per HPI.  Past Medical History:  Diagnosis Date  . Essential hypertension   . History of dermatitis   . History of migraine headaches   . Hyperlipemia 05/24/2016  . Hypothyroidism   . MVP (mitral valve prolapse)    Reported history  . Palpitations    Rare PVCs by Holter monitor April 2011   . Sleep apnea    Reportedly mild  . Vitamin D insufficiency 05/24/2016    Past Surgical History:  Procedure Laterality Date  . APPENDECTOMY    . BREAST LUMPECTOMY     Benign    Family History  Problem  Relation Age of Onset  . Melanoma Father   . Stroke Mother   . Asthma Mother   . Stroke Maternal Grandfather   . Asthma Brother   . Thyroid disease Neg Hx     SOCIAL HX: see hpi   Current Outpatient Medications:  .  albuterol (PROVENTIL HFA;VENTOLIN HFA) 108 (90 Base) MCG/ACT inhaler, Inhale 2 puffs into the lungs every 6 (six) hours as needed., Disp: 1 Inhaler, Rfl: 0 .  Cholecalciferol (VITAMIN D3 PO), Take 1,000 Units by mouth daily., Disp: , Rfl:  .  levothyroxine (SYNTHROID, LEVOTHROID) 75 MCG tablet, TAKE 1 TABLET BY MOUTH ONCE DAILY, Disp: 90 tablet, Rfl: 1 .  metoprolol succinate (TOPROL-XL) 100 MG 24 hr tablet, Take with or immediately following a meal., Disp: 90 tablet, Rfl: 1 .  omeprazole (PRILOSEC) 20 MG capsule, Take 20 mg by mouth daily., Disp: , Rfl:   EXAM:  Vitals:   01/05/19 0902  BP: 120/82  Pulse: 65  Temp: 99 F (37.2 C)    Body mass index is 24.15 kg/m.  GENERAL: vitals reviewed and listed above, alert, oriented, appears well hydrated and in no acute distress  HEENT: atraumatic, conjunttiva clear, no obvious abnormalities on inspection of external nose and ears  NECK: no obvious masses on inspection  LUNGS: clear to auscultation bilaterally, no wheezes, rales or rhonchi, good air movement  CV: HRRR, no peripheral edema  MS: moves all extremities without noticeable abnormality  PSYCH: pleasant and cooperative, no obvious depression or anxiety  ASSESSMENT AND PLAN:  Discussed the following assessment and plan:  Vitamin D insufficiency -  Plan: VITAMIN D 25 Hydroxy (Vit-D Deficiency, Fractures)  Hypothyroidism, unspecified type - Plan: TSH  Hyperlipidemia, unspecified hyperlipidemia type  Gastroesophageal reflux disease without esophagitis  Osteoporosis, unspecified osteoporosis type, unspecified pathological fracture presence  -reviewed change in bone density results, implications, risks, treatment options, etiologies. She would still  prefer to avoid medications but agrees to work on diet, regular cit D, weight bearing exercise and agrees to closer recheck which we advised in 1 year. -labs per orders for other chrornic disease, lifestyle recs -follow up 4 months, sooner as needed  Patient Instructions  BEFORE YOU LEAVE: -labs -order DEXA for 1 year for osteoporosis -follow up: 4 months  Vit D3 1000 IU daily - may adjust pending labs. 1200mg  of calcium from healthy diet daily. Get regular weight bearing exercise.  We have ordered labs or studies at this visit. It can take up to 1-2 weeks for results and processing. IF results require follow up or explanation, we will call you with instructions. Clinically stable results will be released to your [facility]. If you have not heard from us or cannot find your results in [facility] in 2 weeks please contact our office at [phone].  If you are not yet signed up for [facility], please consider signing up.           Terressa KoyanagiHannah R Nahjae Hoeg, DO

## 2019-01-04 NOTE — Telephone Encounter (Signed)
Pre-cert Verification for the following procedure   Echo scheduled for 01-06-2019 at [facility]

## 2019-01-04 NOTE — Patient Instructions (Signed)
Medication Instructions:  Continue all current medications.  Labwork: none  Testing/Procedures:  Your physician has requested that you have an echocardiogram. Echocardiography is a painless test that uses sound waves to create images of your heart. It provides your doctor with information about the size and shape of your heart and how well your heart's chambers and valves are working. This procedure takes approximately one hour. There are no restrictions for this procedure.  Office will contact with results via phone or letter.    Follow-Up: Pending test results.  Any Other Special Instructions Will Be Listed Below (If Applicable).  If you need a refill on your cardiac medications before your next appointment, please call your pharmacy.  

## 2019-01-04 NOTE — Progress Notes (Signed)
Cardiology Office Note  Date: 01/04/2019   ID: Tracey FischerCatherine M Aguinaga, DOB 1951-09-16, MRN [id number]  PCP: Terressa KoyanagiKim, Hannah R, DO  Consulting Cardiologist: Nona DellSamuel McDowell, MD   Chief Complaint  Patient presents with  . Coronary Artery Disease    History of Present Illness: Tracey Giles is a 68 y.o. female seen in the office back in 2016 for evaluation of chest discomfort.  She scheduled a follow-up visit to discuss recurring symptoms.  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  In the middle of this situation she began to experience chest pressure, it lasted for about 2 hours before abating.  This has not occurred since that time, she states that she was very washed out for a few days after the initial event.  Otherwise she reports no new symptoms, longstanding history of palpitations which have not changed.  Stable, NYHA class II dyspnea.  She has had no syncope.  Previous studies from 2016 and 2017 are outlined below.  Follow-up ECG today is normal.  Past Medical History:  Diagnosis Date  . Essential hypertension   . History of dermatitis   . History of migraine headaches   . Hyperlipemia 05/24/2016  . Hypothyroidism   . MVP (mitral valve prolapse)    Reported history  . Palpitations    Rare PVCs by Holter monitor April 2011   . Sleep apnea    Reportedly mild  . Vitamin D insufficiency 05/24/2016    Past Surgical History:  Procedure Laterality Date  . APPENDECTOMY    . BREAST LUMPECTOMY     Benign    Current Outpatient Medications  Medication Sig Dispense Refill  . albuterol (PROVENTIL HFA;VENTOLIN HFA) 108 (90 Base) MCG/ACT inhaler Inhale 2 puffs into the lungs every 6 (six) hours as needed. 1 Inhaler 0  . Cholecalciferol (VITAMIN D3 PO) Take 1,000 Units by mouth daily.    Marland Kitchen. levothyroxine (SYNTHROID, LEVOTHROID) 75 MCG tablet TAKE 1 TABLET BY MOUTH ONCE DAILY 90 tablet 1  .  metoprolol succinate (TOPROL-XL) 100 MG 24 hr tablet Take with or immediately following a meal. 90 tablet 1  . omeprazole (PRILOSEC) 20 MG capsule Take 20 mg by mouth daily.    Marland Kitchen. tobramycin (TOBREX) 0.3 % ophthalmic solution Place 2 drops into the left eye every 4 (four) hours. 5 mL 0   No current facility-administered medications for this visit.    Allergies:  Tums [calcium carbonate antacid]; Cephalexin; Penicillins; and Sulfamethoxazole-trimethoprim   Social History: The patient  reports that she has never smoked. She has never used smokeless tobacco. She reports that she does not drink alcohol or use drugs.   Family History: The patient's family history includes Asthma in her brother and mother; Melanoma in her father; Stroke in her maternal grandfather and mother.   ROS:  Please see the history of present illness. Otherwise, complete review of systems is positive for none.  All other systems are reviewed and negative.   Physical Exam: VS:  BP 136/62   Pulse 64   Ht 5\' 7"  (1.702 m)   Wt 154 lb (69.9 kg)   SpO2 98%   BMI 24.12 kg/m , BMI Body mass index is 24.12 kg/m.  Wt Readings from Last 3 Encounters:  01/04/19 154 lb (69.9 kg)  10/04/18 155 lb 14.4 oz (70.7 kg)  10/04/18 155 lb 9 oz (70.6 kg)    General:  Patient appears comfortable at rest. HEENT: Conjunctiva and lids normal, oropharynx clear. Neck: Supple, no elevated JVP or carotid bruits, no thyromegaly. Lungs: Clear to auscultation, nonlabored breathing at rest. Cardiac: Regular rate and rhythm, no S3 or significant systolic murmur, no pericardial rub. Abdomen: Soft, nontender, bowel sounds present, no guarding or rebound. Extremities: No pitting edema, distal pulses 2+. Skin: Warm and dry. Musculoskeletal: No kyphosis. Neuropsychiatric: Alert and oriented x3, affect grossly appropriate.  ECG: I personally reviewed the tracing from 10/23/2015 which showed normal sinus rhythm.  Recent Labwork: 10/04/2018: TSH 2.47      Component Value Date/Time   CHOL 183 10/04/2018 0839   TRIG 130.0 10/04/2018 0839   HDL 47.70 10/04/2018 0839   CHOLHDL 4 10/04/2018 0839   VLDL 26.0 10/04/2018 0839   LDLCALC 110 (H) 10/04/2018 0839     Other Studies Reviewed Today:  Exercise echocardiogram 11/20/2015: Study Conclusions  - Staged echo: Normal wall motion at rest and with stress. Normal   echo stress - Impressions: Normal study after maximal exercise. Limited view of   the parasternal short axis after stress, however from compliation   of other views wall motion appears normal. Despite symptoms of   chest pain with exercise EKG and imaging not consistent with   ischemia   [facility] treadmill score of -5, consistent with intermediate risk.  Impressions:  - Normal study after maximal exercise. Limited view of the   parasternal short axis after stress, however from compliation of   other views wall motion appears normal. Despite symptoms of chest   pain with exercise EKG and imaging not consistent with ischemia   [facility] treadmill score of -5, consistent with intermediate risk.  Cardiopulmonary stress test 08/07/2016: Interpretation  Notes: Patient gave a very good effort despite strong chest pain. Pulse-oximetry remained 98% or above throughout exercise. Exercise was performed on a cycle ergometer starting at [facility]0W and increasing by 10W/min.  ECG: Resting ECG in normal sinus rhythm. HR response normal. There were no sustained arrhythmias and no ST-T changes. BP response normal.   PFT: Pre-exercise spirometry demonstrates mild restrictive lung patterns. MVV low-normal. Post-exercise spirometry demonstrates significant reduction FEV1.   CPX: Exercise Capacity- Exercise testing with gas exchange demonstrates a mild-moderately reduced peak VO2 of 13.3 ml/kg/min (68.7% of the age/gender/weight matched sedentary norms). The RER of 1.17 indicates a maximal effort.   Cardiovascular response- The O2pulse (a surrogate  for stroke volume) increased only mildly reaching 6 ml/beat (67% predicted). DeltaVO2/Delta WR is 8.5 at peak exercise Indicating evidence of cardiovascular impairment.  Ventilatory response- The VE/VCO2 slope is normal. The oxygen uptake efficiency slope (OUES) is moderately reduced and matches measured functional capacity. The VO2 at the ventilatory threshold was normal at 61% of the predicted peak VO2. At peak exercise, the ventilation reached 47% of the measured MVV and breathing reserve was 36.5 indicating ventilatory reserve remained. PETCO2 was normal at 33 mmHg during exercise.   Conclusion: Exercise testing with gas exchange demonstrates a mild-to-moderate functional impairment when compared to matched sedentary norms. Patient appears to likely have mild circulatory limitation indicated with low VO2WorkSLope and low-flat O2 pulse (suggesting possible ischemia, however ECG within normal limits). However, at peak exercise, patient appears primarily limited due to exercise-induced bronchospasm with mild-moderate reduction in FEV1 for up to 15 minutes post-exercise.   Assessment and Plan:  1.  Episode of chest discomfort as outlined above, possibly secondary to situational anxiety, rule out stress-induced cardiomyopathy.  Her ECG is normal and she has had  no subsequent symptoms which is reassuring.  We will obtain an echocardiogram to evaluate cardiac structure and function.  2.  History of reactive airways disease, she has albuterol inhalers.  No recent wheezing.  3.  Hypothyroidism, on Synthroid.  4.  Long-standing history of intermittent palpitations, continues on Toprol-XL.  No change in intensity or frequency.  Current medicines were reviewed with the patient today.   Orders Placed This Encounter  Procedures  . EKG 12-Lead  . ECHOCARDIOGRAM COMPLETE    Disposition: Call with test results.  Signed, Jonelle Sidle, MD, [facility] 01/04/2019 1:24 PM    [facility]  [facility] at [facility] [address] [place], [place], [place] [address] Phone: [phone]; Fax: [phone]

## 2019-01-05 ENCOUNTER — Telehealth: Payer: Self-pay | Admitting: *Deleted

## 2019-01-05 ENCOUNTER — Encounter: Payer: Self-pay | Admitting: Family Medicine

## 2019-01-05 ENCOUNTER — Ambulatory Visit: Payer: Medicare Other | Admitting: Family Medicine

## 2019-01-05 VITALS — BP 120/82 | HR 65 | Temp 99.0°F | Ht 67.0 in | Wt 154.2 lb

## 2019-01-05 DIAGNOSIS — E785 Hyperlipidemia, unspecified: Secondary | ICD-10-CM

## 2019-01-05 DIAGNOSIS — E039 Hypothyroidism, unspecified: Secondary | ICD-10-CM

## 2019-01-05 DIAGNOSIS — K219 Gastro-esophageal reflux disease without esophagitis: Secondary | ICD-10-CM | POA: Diagnosis not present

## 2019-01-05 DIAGNOSIS — E559 Vitamin D deficiency, unspecified: Secondary | ICD-10-CM | POA: Diagnosis not present

## 2019-01-05 DIAGNOSIS — M810 Age-related osteoporosis without current pathological fracture: Secondary | ICD-10-CM

## 2019-01-05 LAB — TSH: TSH: 3.6 u[IU]/mL (ref 0.35–4.50)

## 2019-01-05 LAB — VITAMIN D 25 HYDROXY (VIT D DEFICIENCY, FRACTURES): VITD: 31.27 ng/mL (ref 30.00–100.00)

## 2019-01-05 NOTE — Patient Instructions (Addendum)
BEFORE YOU LEAVE: -labs -order DEXA for 1 year for osteoporosis -follow up: 4 months  Vit D3 1000 IU daily - may adjust pending labs. 1200mg  of calcium from healthy diet daily. Get regular weight bearing exercise.  We have ordered labs or studies at this visit. It can take up to 1-2 weeks for results and processing. IF results require follow up or explanation, we will call you with instructions. Clinically stable results will be released to your [facility]. If you have not heard from [place] or cannot find your results in [facility] in 2 weeks please contact our office at [phone].  If you are not yet signed up for [facility], please consider signing up.

## 2019-01-05 NOTE — Telephone Encounter (Signed)
Per Dr Selena Batten, order for repeat bone density in 1 year due to osteoporosis completed and faxed to [facility] at [phone].

## 2019-01-06 ENCOUNTER — Telehealth: Payer: Self-pay | Admitting: *Deleted

## 2019-01-06 ENCOUNTER — Ambulatory Visit (HOSPITAL_COMMUNITY)
Admission: RE | Admit: 2019-01-06 | Discharge: 2019-01-06 | Disposition: A | Discharge status: Home / Self Care | Payer: Medicare Other | Source: Ambulatory Visit | Attending: Cardiology | Admitting: Cardiology

## 2019-01-06 DIAGNOSIS — R072 Precordial pain: Secondary | ICD-10-CM | POA: Diagnosis not present

## 2019-01-06 NOTE — Telephone Encounter (Signed)
-----   Message from Jonelle Sidle, MD sent at 01/06/2019  3:24 PM EST ----- Results reviewed.  Please let her know that the echocardiogram showed normal LVEF greater than 65%, no wall motion abnormalities.  This is reassuring in terms of her reported symptoms, no further cardiac work-up is planned at this time. A copy of this test should be forwarded to Terressa Koyanagi, DO.

## 2019-01-06 NOTE — Progress Notes (Signed)
*  PRELIMINARY RESULTS* Echocardiogram 2D Echocardiogram has been performed.  Stacey DrainWhite, Kevona Lupinacci J 01/06/2019, 2:48 PM

## 2019-01-06 NOTE — Telephone Encounter (Signed)
Patient informed. Copy sent to PCP °

## 2019-02-09 ENCOUNTER — Encounter: Payer: Self-pay | Admitting: Family Medicine

## 2019-02-16 ENCOUNTER — Other Ambulatory Visit: Payer: Self-pay | Admitting: Family Medicine

## 2019-05-08 ENCOUNTER — Ambulatory Visit: Payer: Medicare Other | Admitting: Family Medicine

## 2019-05-16 ENCOUNTER — Other Ambulatory Visit: Payer: Self-pay | Admitting: Family Medicine

## 2019-05-19 ENCOUNTER — Other Ambulatory Visit: Payer: Self-pay | Admitting: Family Medicine

## 2019-05-24 ENCOUNTER — Telehealth: Payer: Self-pay | Admitting: Family Medicine

## 2019-05-24 NOTE — Telephone Encounter (Signed)
OK - curious that they need approval since this is levothyroxine and med isn't written "DAW". But it is fine.

## 2019-05-24 NOTE — Telephone Encounter (Signed)
Pharmacy requesting that levothyroxine be changed to Euthyrox. [facility] is switching brands. Does not need new script just needs a verbal. Faxed this requets on 06/09.  Dr. Marchelle Gearing

## 2019-05-25 NOTE — Telephone Encounter (Signed)
I called [facility], spoke with Caryl Pina and informed her of the message below.  Caryl Pina stated they will counsel the pt as well.

## 2019-06-21 ENCOUNTER — Encounter: Payer: Self-pay | Admitting: Family Medicine

## 2019-06-21 ENCOUNTER — Telehealth: Payer: Self-pay | Admitting: *Deleted

## 2019-06-21 ENCOUNTER — Other Ambulatory Visit: Payer: Self-pay

## 2019-06-21 ENCOUNTER — Ambulatory Visit (INDEPENDENT_AMBULATORY_CARE_PROVIDER_SITE_OTHER): Payer: Medicare Other | Admitting: Family Medicine

## 2019-06-21 VITALS — BP 129/76 | HR 62 | Wt 152.0 lb

## 2019-06-21 DIAGNOSIS — R6884 Jaw pain: Secondary | ICD-10-CM

## 2019-06-21 DIAGNOSIS — K219 Gastro-esophageal reflux disease without esophagitis: Secondary | ICD-10-CM

## 2019-06-21 DIAGNOSIS — G629 Polyneuropathy, unspecified: Secondary | ICD-10-CM

## 2019-06-21 DIAGNOSIS — E785 Hyperlipidemia, unspecified: Secondary | ICD-10-CM

## 2019-06-21 DIAGNOSIS — M810 Age-related osteoporosis without current pathological fracture: Secondary | ICD-10-CM

## 2019-06-21 DIAGNOSIS — H8102 Meniere's disease, left ear: Secondary | ICD-10-CM

## 2019-06-21 DIAGNOSIS — J45990 Exercise induced bronchospasm: Secondary | ICD-10-CM

## 2019-06-21 DIAGNOSIS — Z131 Encounter for screening for diabetes mellitus: Secondary | ICD-10-CM

## 2019-06-21 DIAGNOSIS — E559 Vitamin D deficiency, unspecified: Secondary | ICD-10-CM

## 2019-06-21 DIAGNOSIS — J309 Allergic rhinitis, unspecified: Secondary | ICD-10-CM

## 2019-06-21 DIAGNOSIS — E039 Hypothyroidism, unspecified: Secondary | ICD-10-CM | POA: Diagnosis not present

## 2019-06-21 NOTE — Telephone Encounter (Signed)
I called the pt and scheduled the appts as below.

## 2019-06-21 NOTE — Progress Notes (Signed)
Virtual Visit via Video Note  I connected with Tracey Giles   on 06/21/19 at 10:00 AM EDT by a video enabled telemedicine application and verified that I am speaking with the correct person using two identifiers.  Location patient: home Location provider:work office Persons participating in the virtual visit: patient, provider  I discussed the limitations of evaluation and management by telemedicine and the availability of in person appointments. The patient expressed understanding and agreed to proceed.   [facility] DOB: 09-23-1951 Encounter date: 06/21/2019  This is a 68 y.o. female who presents to establish care.  No chief complaint on file.  Last visit with HK was 01/05/19. Last AWV was 09/2018  History of present illness:  Jaw started hurting 3 months ago - right side. Sometimes notes in joint itself; sometimes feels more like lower jaw. Right below gum line in soft tissue seems to be very sensitive. Sometimes clicking with chewing. Not painful with eating. Hurting consistently. Not red that she can tell. Some days worse than others. Has been over 6 months since seeing dentist; visit was cancelled with COVID. Takes extra strength tylenol which helps some. Not had this issue in past. Had issues with root canal and had tooth pulled over there about a year ago.   Notes vibration in feet - feels it when she is sitting in evening. With feet on floor feels like floor is vibrating. Feet not numb/tingly. Notes more on left side. Happening for a couple of months. Not had this sensation in hands. If she walks around it goes away. Notes with sitting/lying down. No cramping in muscles. No noted weakness.   Mother is in nursing home; increased stress for patient with this. Mother doesn't understand why she is there and why no one can see her.   ZOX:[id number]:evaluated by cardiology in Jan after chest pressure. Stable.  Sleep apnea: feels this resolved with 30lb weight loss. Sleeps well,  rested in morning.   Allergic rhinitis: claritin, flonase. Has seen pulm in past (2017) for dyspnea/upper airway cough syndrome.  Exercise induced asthma: does still get this on occasion. Just with activity.   GERD:prilosec 20mg  daily. Just uses half tab daily which controls symptoms.   Hypothyroid: last rx for synthroid made her feel like she was breaking out in hives; but new vendor/med seems better. Not having any problems.   Meniere's disease: does still have issues with this - sometimes legs wobbly. Some days has spinning sensation. Not every day thing. Sometimes she thinks triggered by allergies/stress. Doesn't take any medication for this.   Osteoporosis:takes some vitamin D, calcium intake through diet; doesn't want to be on medications. Weight bearing exercise.   HL: not on medication.   [facility] Does see them regularly.   Past Medical History:  Diagnosis Date  . Essential hypertension   . History of dermatitis   . History of migraine headaches   . Hyperlipemia 05/24/2016  . Hypothyroidism   . MVP (mitral valve prolapse)    Reported history  . Palpitations    Rare PVCs by Holter monitor April 2011   . Sleep apnea    Reportedly mild  . Vitamin D insufficiency 05/24/2016   Past Surgical History:  Procedure Laterality Date  . APPENDECTOMY    . BREAST LUMPECTOMY     Benign   Allergies  Allergen Reactions  . Tums [Calcium Carbonate Antacid] Hives    Burning sensation in mouth, rash on torso  .  Cephalexin   . Penicillins     REACTION: unspecified  . Sulfamethoxazole-Trimethoprim     REACTION: unspecified   Current Meds  Medication Sig  . albuterol (PROVENTIL HFA;VENTOLIN HFA) 108 (90 Base) MCG/ACT inhaler Inhale 2 puffs into the lungs every 6 (six) hours as needed.  . Cholecalciferol (VITAMIN D3 PO) Take 1,000 Units by mouth daily.  Marland Kitchen levothyroxine (SYNTHROID, LEVOTHROID) 75 MCG tablet Take 1 tablet by mouth once daily  .  metoprolol succinate (TOPROL-XL) 100 MG 24 hr tablet TAKE WITH OR IMMEDIATELY FOLLOWING A MEAL  . omeprazole (PRILOSEC) 20 MG capsule Take 20 mg by mouth daily.   Social History   Tobacco Use  . Smoking status: Never Smoker  . Smokeless tobacco: Never Used  Substance Use Topics  . Alcohol use: No   Family History  Problem Relation Age of Onset  . Melanoma Father   . Stroke Mother   . Asthma Mother   . Stroke Maternal Grandfather   . Asthma Brother   . Thyroid disease Neg Hx      Review of Systems  Constitutional: Negative for chills, fatigue and fever.  Respiratory: Negative for cough, chest tightness, shortness of breath and wheezing.   Cardiovascular: Negative for chest pain, palpitations and leg swelling.  Neurological:       See HPI    Objective:  BP 129/76 Comment: taken by pt-jaf  Pulse 62   Wt 152 lb (68.9 kg)   BMI 23.81 kg/Giles   Weight: 152 lb (68.9 kg)   BP Readings from Last 3 Encounters:  06/21/19 129/76  01/05/19 120/82  01/04/19 136/62   Wt Readings from Last 3 Encounters:  06/21/19 152 lb (68.9 kg)  01/05/19 154 lb 3.2 oz (69.9 kg)  01/04/19 154 lb (69.9 kg)    EXAM:  GENERAL: alert, oriented, appears well and in no acute distress  HEENT: atraumatic, conjunctiva clear, no obvious abnormalities on inspection of external nose and ears  NECK: normal movements of the head and neck  LUNGS: on inspection no signs of respiratory distress, breathing rate appears normal, no obvious gross SOB, gasping or wheezing  CV: no obvious cyanosis  MS: moves all visible extremities without noticeable abnormality  PSYCH/NEURO: pleasant and cooperative, no obvious depression or anxiety, speech and thought processing grossly intact  Assessment/Plan  1. Hypothyroidism, unspecified type Doing ok with current synthroid type.  - TSH; Future  2. Hyperlipidemia, unspecified hyperlipidemia type - Comprehensive metabolic panel; Future  3. Gastroesophageal  reflux disease without esophagitis prilosec 10mg  daily controlling sx.  4. Osteoporosis, unspecified osteoporosis type, unspecified pathological fracture presence Continue with weight bearing exercise, supplemental vitamin d  - VITAMIN D 25 Hydroxy (Vit-D Deficiency, Fractures); Future  5. Vitamin D insufficiency  - VITAMIN D 25 Hydroxy (Vit-D Deficiency, Fractures); Future  6. Exercise-induced asthma Stable. Intermittent albuterol use.  7. Allergic rhinitis, unspecified seasonality, unspecified trigger Stable with flonase, claritin  8. Meniere's disease of left ear Stable. Occasional sx. Tolerates without medication.  9. Neuropathy New comlaint.  Describes more of a vibration sensation on the feet when sitting with feet on the ground.  More noted in the evening.  Further evaluation pending blood work results. - CBC with Differential/Platelet; Future - Vitamin B12; Future - IBC + Ferritin; Future  10. Screening for diabetes mellitus  - Hemoglobin A1c; Future  11. Jaw pain I have asked her to see her dentist first for evaluation of this.  If she is unable to get an appointment or needs  to be seen sooner, I am happy to see her in office so that this can be better evaluated.  We did discuss trying ice, TMJ massage, chewy foods that may aggravate jaw issue.  Return labwork; then AWV with HK in October..  I discussed the assessment and treatment plan with the patient. The patient was provided an opportunity to ask questions and all were answered. The patient agreed with the plan and demonstrated an understanding of the instructions.   The patient was advised to call back or seek an in-person evaluation if the symptoms worsen or if the condition fails to improve as anticipated.  I provided 32 minutes of non-face-to-face time during this encounter.   Theodis ShoveJunell , MD

## 2019-06-21 NOTE — Telephone Encounter (Signed)
-----   Message from Caren Macadam, MD sent at 06/21/2019 10:36 AM EDT ----- Please schedule bloodwork for her when able; then AWV with [facility] in October.

## 2019-06-26 ENCOUNTER — Other Ambulatory Visit: Payer: Self-pay

## 2019-06-26 ENCOUNTER — Other Ambulatory Visit (INDEPENDENT_AMBULATORY_CARE_PROVIDER_SITE_OTHER): Payer: Medicare Other

## 2019-06-26 DIAGNOSIS — E039 Hypothyroidism, unspecified: Secondary | ICD-10-CM

## 2019-06-26 DIAGNOSIS — E538 Deficiency of other specified B group vitamins: Secondary | ICD-10-CM

## 2019-06-26 DIAGNOSIS — M810 Age-related osteoporosis without current pathological fracture: Secondary | ICD-10-CM | POA: Diagnosis not present

## 2019-06-26 DIAGNOSIS — E785 Hyperlipidemia, unspecified: Secondary | ICD-10-CM

## 2019-06-26 DIAGNOSIS — E559 Vitamin D deficiency, unspecified: Secondary | ICD-10-CM

## 2019-06-26 DIAGNOSIS — G629 Polyneuropathy, unspecified: Secondary | ICD-10-CM

## 2019-06-26 LAB — CBC WITH DIFFERENTIAL/PLATELET
Basophils Absolute: 0.1 10*3/uL (ref 0.0–0.1)
Basophils Relative: 1.4 % (ref 0.0–3.0)
Eosinophils Absolute: 0.5 10*3/uL (ref 0.0–0.7)
Eosinophils Relative: 5.8 % — ABNORMAL HIGH (ref 0.0–5.0)
HCT: 42.6 % (ref 36.0–46.0)
Hemoglobin: 14.3 g/dL (ref 12.0–15.0)
Lymphocytes Relative: 20.3 % (ref 12.0–46.0)
Lymphs Abs: 1.6 10*3/uL (ref 0.7–4.0)
MCHC: 33.5 g/dL (ref 30.0–36.0)
MCV: 92.5 fl (ref 78.0–100.0)
Monocytes Absolute: 0.5 10*3/uL (ref 0.1–1.0)
Monocytes Relative: 7 % (ref 3.0–12.0)
Neutro Abs: 5.1 10*3/uL (ref 1.4–7.7)
Neutrophils Relative %: 65.5 % (ref 43.0–77.0)
Platelets: 285 10*3/uL (ref 150.0–400.0)
RBC: 4.6 Mil/uL (ref 3.87–5.11)
RDW: 13.4 % (ref 11.5–15.5)
WBC: 7.8 10*3/uL (ref 4.0–10.5)

## 2019-06-27 LAB — VITAMIN D 25 HYDROXY (VIT D DEFICIENCY, FRACTURES): VITD: 36.27 ng/mL (ref 30.00–100.00)

## 2019-06-27 LAB — COMPREHENSIVE METABOLIC PANEL
ALT: 8 U/L (ref 0–35)
AST: 15 U/L (ref 0–37)
Albumin: 4.2 g/dL (ref 3.5–5.2)
Alkaline Phosphatase: 102 U/L (ref 39–117)
BUN: 15 mg/dL (ref 6–23)
CO2: 25 mEq/L (ref 19–32)
Calcium: 8.9 mg/dL (ref 8.4–10.5)
Chloride: 106 mEq/L (ref 96–112)
Creatinine, Ser: 0.83 mg/dL (ref 0.40–1.20)
GFR: 68.4 mL/min (ref >60.00)
Glucose, Bld: 95 mg/dL (ref 70–99)
Potassium: 4.5 mEq/L (ref 3.5–5.1)
Sodium: 140 mEq/L (ref 135–145)
Total Bilirubin: 0.4 mg/dL (ref 0.2–1.2)
Total Protein: 6.7 g/dL (ref 6.0–8.3)

## 2019-06-27 LAB — IBC + FERRITIN
Ferritin: 92.6 ng/mL (ref 10.0–291.0)
Iron: 55 ug/dL (ref 42–145)
Saturation Ratios: 14.8 % — ABNORMAL LOW (ref 20.0–50.0)
Transferrin: 265 mg/dL (ref 212.0–360.0)

## 2019-06-27 LAB — VITAMIN B12: Vitamin B-12: 135 pg/mL — ABNORMAL LOW (ref 211–911)

## 2019-06-27 LAB — TSH: TSH: 1.48 u[IU]/mL (ref 0.35–4.50)

## 2019-07-06 NOTE — Progress Notes (Signed)
vitsmin

## 2019-07-26 ENCOUNTER — Other Ambulatory Visit: Payer: Self-pay | Admitting: Family Medicine

## 2019-07-26 NOTE — Telephone Encounter (Signed)
Forwarding to Dr. Kim

## 2019-08-01 NOTE — Telephone Encounter (Signed)
Please call pt. We received a refill request for a controlled medication. This is not a medication we have prescribed recently.Recommend an appt - happy to see her today - with me or Dr. Ethlyn Gallery to help figure out the best option.

## 2019-08-02 NOTE — Telephone Encounter (Signed)
Virtual visit scheduled with Dr. Maudie Mercury for 08/03/2019 at 10:20 AM

## 2019-08-02 NOTE — Telephone Encounter (Signed)
Noted. Looks like last ativan rx was 2017 from [facility]; previously had diazepam from Dr. Arnoldo Morale. No controlled substances since 2017. Looks like [facility] will address 8/27.

## 2019-08-03 ENCOUNTER — Other Ambulatory Visit: Payer: Self-pay

## 2019-08-03 ENCOUNTER — Encounter: Payer: Self-pay | Admitting: Family Medicine

## 2019-08-03 ENCOUNTER — Telehealth (INDEPENDENT_AMBULATORY_CARE_PROVIDER_SITE_OTHER): Payer: Medicare Other | Admitting: Family Medicine

## 2019-08-03 DIAGNOSIS — F439 Reaction to severe stress, unspecified: Secondary | ICD-10-CM

## 2019-08-03 DIAGNOSIS — F410 Panic disorder [episodic paroxysmal anxiety] without agoraphobia: Secondary | ICD-10-CM | POA: Diagnosis not present

## 2019-08-03 DIAGNOSIS — H8102 Meniere's disease, left ear: Secondary | ICD-10-CM

## 2019-08-03 MED ORDER — LORAZEPAM 0.5 MG PO TABS: ORAL_TABLET | ORAL | 0 refills | Status: DC

## 2019-08-03 NOTE — Progress Notes (Signed)
Virtual Visit via Video Note  I connected with Tracey Giles  on 08/03/19 at 10:20 AM EDT by a video enabled telemedicine application and verified that I am speaking with the correct person using two identifiers.  Location patient: home Location provider:work or home office Persons participating in the virtual visit: patient, provider  I discussed the limitations of evaluation and management by telemedicine and the availability of in person appointments. The patient expressed understanding and agreed to proceed.   HPI:  Acute visit for Stress: -mother is in hospice -she has a lot going on -gets stress sometimes and also when meniere's flares up -she has used a very low dose 0.25mg  of lorazepam rarely in these cases - she still has a few pills left from a prescription 3 years ago and requests a refill as it is expired -denies depression, thoughts of harm severe symptoms or change in chronic Meniere's symptoms  ROS: See pertinent positives and negatives per HPI.  Past Medical History:  Diagnosis Date  . Essential hypertension   . History of dermatitis   . History of migraine headaches   . Hyperlipemia 05/24/2016  . Hypothyroidism   . MVP (mitral valve prolapse)    Reported history  . Palpitations    Rare PVCs by Holter monitor April 2011   . Sleep apnea    Reportedly mild  . Vitamin D insufficiency 05/24/2016    Past Surgical History:  Procedure Laterality Date  . APPENDECTOMY    . BREAST LUMPECTOMY     Benign    Family History  Problem Relation Age of Onset  . Melanoma Father 69  . Stroke Mother   . Asthma Mother   . Stroke Maternal Grandfather   . Asthma Brother   . Deep vein thrombosis Brother 45       had back surgery with post op complications of blood clots which were fatal  . Thyroid disease Neg Hx     SOCIAL HX: see hpi   Current Outpatient Medications:  .  albuterol (PROVENTIL HFA;VENTOLIN HFA) 108 (90 Base) MCG/ACT inhaler, Inhale 2 puffs into the lungs  every 6 (six) hours as needed., Disp: 1 Inhaler, Rfl: 0 .  Cholecalciferol (VITAMIN D3 PO), Take 1,000 Units by mouth daily., Disp: , Rfl:  .  levothyroxine (SYNTHROID, LEVOTHROID) 75 MCG tablet, Take 1 tablet by mouth once daily, Disp: 90 tablet, Rfl: 1 .  metoprolol succinate (TOPROL-XL) 100 MG 24 hr tablet, TAKE WITH OR IMMEDIATELY FOLLOWING A MEAL, Disp: 90 tablet, Rfl: 0 .  omeprazole (PRILOSEC) 20 MG capsule, Take 20 mg by mouth daily., Disp: , Rfl:  .  LORazepam (ATIVAN) 0.5 MG tablet, Use 1/2 to 1 tablet as needed for panic attacks/anxiety, Disp: 10 tablet, Rfl: 0  EXAM:  VITALS per patient if applicable:  GENERAL: alert, oriented, appears well and in no acute distress  HEENT: atraumatic, conjunttiva clear, no obvious abnormalities on inspection of external nose and ears  NECK: normal movements of the head and neck  LUNGS: on inspection no signs of respiratory distress, breathing rate appears normal, no obvious gross SOB, gasping or wheezing  CV: no obvious cyanosis  MS: moves all visible extremities without noticeable abnormality  PSYCH/NEURO: pleasant and cooperative, no obvious depression or anxiety, speech and thought processing grossly intact  ASSESSMENT AND PLAN:  Discussed the following assessment and plan:  Meniere's disease of left ear  Panic disorder - Plan: LORazepam (ATIVAN) 0.5 MG tablet  Stress  Discussed options for the management of stress,  anxiety, meniere's and the risks. Discussed the significant risks with benzos, however, she uses a very low dose very rarely with success and likely the risks is minimal compared to regular or higher dose use. She prefers to continue this as needed and she may also try a low dose of magnesium. She is on B12 as we for B12 deficiency. Follow up as scheduled in 3 months for AWV and follow up.    I discussed the assessment and treatment plan with the patient. The patient was provided an opportunity to ask questions and all  were answered. The patient agreed with the plan and demonstrated an understanding of the instructions.   The patient was advised to call back or seek an in-person evaluation if the symptoms worsen or if the condition fails to improve as anticipated.   Lucretia Kern, DO   Patient Instructions  Lorazepam tablets What is this medicine? LORAZEPAM (lor A ze pam) is a benzodiazepine. It is used to treat anxiety. This medicine may be used for other purposes; ask your health care provider or pharmacist if you have questions. COMMON BRAND NAME(S): Ativan What should I tell my health care provider before I take this medicine? They need to know if you have any of these conditions:  glaucoma  history of drug or alcohol abuse problem  kidney disease  liver disease  lung or breathing disease, like asthma  mental illness  myasthenia gravis  Parkinson's disease  suicidal thoughts, plans, or attempt; a previous suicide attempt by you or a family member  an unusual or allergic reaction to lorazepam, other medicines, foods, dyes, or preservatives  pregnant or trying to get pregnant  breast-feeding How should I use this medicine? Take this medicine by mouth with a glass of water. Follow the directions on the prescription label. Take your medicine at regular intervals. Do not take it more often than directed. Do not stop taking except on your doctor's advice. A special MedGuide will be given to you by the pharmacist with each prescription and refill. Be sure to read this information carefully each time. Talk to your pediatrician regarding the use of this medicine in children. While this drug may be used in children as young as 12 years for selected conditions, precautions do apply. Overdosage: If you think you have taken too much of this medicine contact a poison control center or emergency room at once. NOTE: This medicine is only for you. Do not share this medicine with others. What if I  miss a dose? If you miss a dose, take it as soon as you can. If it is almost time for your next dose, take only that dose. Do not take double or extra doses. What may interact with this medicine? Do not take this medicine with any of the following medications:  narcotic medicines for cough  sodium oxybate This medicine may also interact with the following medications:  alcohol  antihistamines for allergy, cough and cold  certain medicines for anxiety or sleep  certain medicines for depression, like amitriptyline, fluoxetine, sertraline  certain medicines for seizures like carbamazepine, phenobarbital, phenytoin, primidone  general anesthetics like lidocaine, pramoxine, tetracaine  MAOIs like Carbex, Eldepryl, Marplan, Nardil, and Parnate  medicines that relax muscles for surgery  narcotic medicines for pain  phenothiazines like chlorpromazine, mesoridazine, prochlorperazine, thioridazine This list may not describe all possible interactions. Give your health care provider a list of all the medicines, herbs, non-prescription drugs, or dietary supplements you use. Also tell them  if you smoke, drink alcohol, or use illegal drugs. Some items may interact with your medicine. What should I watch for while using this medicine? Tell your doctor or health care professional if your symptoms do not start to get better or if they get worse. Do not stop taking except on your doctor's advice. You may develop a severe reaction. Your doctor will tell you how much medicine to take. You may get drowsy or dizzy. Do not drive, use machinery, or do anything that needs mental alertness until you know how this medicine affects you. To reduce the risk of dizzy and fainting spells, do not stand or sit up quickly, especially if you are an older patient. Alcohol may increase dizziness and drowsiness. Avoid alcoholic drinks. If you are taking another medicine that also causes drowsiness, you may have more side  effects. Give your health care provider a list of all medicines you use. Your doctor will tell you how much medicine to take. Do not take more medicine than directed. Call emergency for help if you have problems breathing or unusual sleepiness. What side effects may I notice from receiving this medicine? Side effects that you should report to your doctor or health care professional as soon as possible:  allergic reactions like skin rash, itching or hives, swelling of the face, lips, or tongue  breathing problems  confusion  loss of balance or coordination  signs and symptoms of low blood pressure like dizziness; feeling faint or lightheaded, falls; unusually weak or tired  suicidal thoughts or other mood changes Side effects that usually do not require medical attention (report to your doctor or health care professional if they continue or are bothersome):  dizziness  headache  nausea, vomiting  tiredness This list may not describe all possible side effects. Call your doctor for medical advice about side effects. You may report side effects to FDA at 1-800-FDA-1088. Where should I keep my medicine? Keep out of the reach of children. This medicine can be abused. Keep your medicine in a safe place to protect it from theft. Do not share this medicine with anyone. Selling or giving away this medicine is dangerous and against the law. This medicine may cause accidental overdose and death if taken by other adults, children, or pets. Mix any unused medicine with a substance like cat litter or coffee grounds. Then throw the medicine away in a sealed container like a sealed bag or a coffee can with a lid. Do not use the medicine after the expiration date. Store at room temperature between 20 and 25 degrees C (68 and 77 degrees F). Protect from light. Keep container tightly closed. NOTE: This sheet is a summary. It may not cover all possible information. If you have questions about this medicine,  talk to your doctor, pharmacist, or health care provider.  2020 Elsevier/Gold Standard (2015-08-22 15:54:27)

## 2019-08-03 NOTE — Patient Instructions (Signed)
Lorazepam tablets What is this medicine? LORAZEPAM (lor A ze pam) is a benzodiazepine. It is used to treat anxiety. This medicine may be used for other purposes; ask your health care provider or pharmacist if you have questions. COMMON BRAND NAME(S): Ativan What should I tell my health care provider before I take this medicine? They need to know if you have any of these conditions:  glaucoma  history of drug or alcohol abuse problem  kidney disease  liver disease  lung or breathing disease, like asthma  mental illness  myasthenia gravis  Parkinson's disease  suicidal thoughts, plans, or attempt; a previous suicide attempt by you or a family member  an unusual or allergic reaction to lorazepam, other medicines, foods, dyes, or preservatives  pregnant or trying to get pregnant  breast-feeding How should I use this medicine? Take this medicine by mouth with a glass of water. Follow the directions on the prescription label. Take your medicine at regular intervals. Do not take it more often than directed. Do not stop taking except on your doctor's advice. A special MedGuide will be given to you by the pharmacist with each prescription and refill. Be sure to read this information carefully each time. Talk to your pediatrician regarding the use of this medicine in children. While this drug may be used in children as young as 12 years for selected conditions, precautions do apply. Overdosage: If you think you have taken too much of this medicine contact a poison control center or emergency room at once. NOTE: This medicine is only for you. Do not share this medicine with others. What if I miss a dose? If you miss a dose, take it as soon as you can. If it is almost time for your next dose, take only that dose. Do not take double or extra doses. What may interact with this medicine? Do not take this medicine with any of the following medications:  narcotic medicines for  cough  sodium oxybate This medicine may also interact with the following medications:  alcohol  antihistamines for allergy, cough and cold  certain medicines for anxiety or sleep  certain medicines for depression, like amitriptyline, fluoxetine, sertraline  certain medicines for seizures like carbamazepine, phenobarbital, phenytoin, primidone  general anesthetics like lidocaine, pramoxine, tetracaine  MAOIs like Carbex, Eldepryl, Marplan, Nardil, and Parnate  medicines that relax muscles for surgery  narcotic medicines for pain  phenothiazines like chlorpromazine, mesoridazine, prochlorperazine, thioridazine This list may not describe all possible interactions. Give your health care provider a list of all the medicines, herbs, non-prescription drugs, or dietary supplements you use. Also tell them if you smoke, drink alcohol, or use illegal drugs. Some items may interact with your medicine. What should I watch for while using this medicine? Tell your doctor or health care professional if your symptoms do not start to get better or if they get worse. Do not stop taking except on your doctor's advice. You may develop a severe reaction. Your doctor will tell you how much medicine to take. You may get drowsy or dizzy. Do not drive, use machinery, or do anything that needs mental alertness until you know how this medicine affects you. To reduce the risk of dizzy and fainting spells, do not stand or sit up quickly, especially if you are an older patient. Alcohol may increase dizziness and drowsiness. Avoid alcoholic drinks. If you are taking another medicine that also causes drowsiness, you may have more side effects. Give your health care provider a   list of all medicines you use. Your doctor will tell you how much medicine to take. Do not take more medicine than directed. Call emergency for help if you have problems breathing or unusual sleepiness. What side effects may I notice from  receiving this medicine? Side effects that you should report to your doctor or health care professional as soon as possible:  allergic reactions like skin rash, itching or hives, swelling of the face, lips, or tongue  breathing problems  confusion  loss of balance or coordination  signs and symptoms of low blood pressure like dizziness; feeling faint or lightheaded, falls; unusually weak or tired  suicidal thoughts or other mood changes Side effects that usually do not require medical attention (report to your doctor or health care professional if they continue or are bothersome):  dizziness  headache  nausea, vomiting  tiredness This list may not describe all possible side effects. Call your doctor for medical advice about side effects. You may report side effects to FDA at 1-800-FDA-1088. Where should I keep my medicine? Keep out of the reach of children. This medicine can be abused. Keep your medicine in a safe place to protect it from theft. Do not share this medicine with anyone. Selling or giving away this medicine is dangerous and against the law. This medicine may cause accidental overdose and death if taken by other adults, children, or pets. Mix any unused medicine with a substance like cat litter or coffee grounds. Then throw the medicine away in a sealed container like a sealed bag or a coffee can with a lid. Do not use the medicine after the expiration date. Store at room temperature between 20 and 25 degrees C (68 and 77 degrees F). Protect from light. Keep container tightly closed. NOTE: This sheet is a summary. It may not cover all possible information. If you have questions about this medicine, talk to your doctor, pharmacist, or health care provider.  2020 Elsevier/Gold Standard (2015-08-22 15:54:27)  

## 2019-08-17 ENCOUNTER — Other Ambulatory Visit: Payer: Self-pay | Admitting: Family Medicine

## 2019-10-05 ENCOUNTER — Other Ambulatory Visit (INDEPENDENT_AMBULATORY_CARE_PROVIDER_SITE_OTHER): Payer: Medicare Other

## 2019-10-05 ENCOUNTER — Other Ambulatory Visit: Payer: Self-pay

## 2019-10-05 ENCOUNTER — Ambulatory Visit (INDEPENDENT_AMBULATORY_CARE_PROVIDER_SITE_OTHER): Payer: Medicare Other

## 2019-10-05 ENCOUNTER — Encounter: Payer: Self-pay | Admitting: Family Medicine

## 2019-10-05 DIAGNOSIS — Z23 Encounter for immunization: Secondary | ICD-10-CM

## 2019-10-05 DIAGNOSIS — E538 Deficiency of other specified B group vitamins: Secondary | ICD-10-CM

## 2019-10-06 ENCOUNTER — Other Ambulatory Visit: Payer: Medicare Other

## 2019-10-06 LAB — VITAMIN B12: Vitamin B-12: 532 pg/mL (ref 211–911)

## 2019-10-10 ENCOUNTER — Other Ambulatory Visit: Payer: Self-pay

## 2019-10-10 ENCOUNTER — Ambulatory Visit (INDEPENDENT_AMBULATORY_CARE_PROVIDER_SITE_OTHER): Payer: Medicare Other | Admitting: Family Medicine

## 2019-10-10 ENCOUNTER — Encounter: Payer: Self-pay | Admitting: Family Medicine

## 2019-10-10 VITALS — BP 123/74 | HR 57 | Temp 97.3°F | Wt 150.7 lb

## 2019-10-10 DIAGNOSIS — J45990 Exercise induced bronchospasm: Secondary | ICD-10-CM

## 2019-10-10 DIAGNOSIS — Z0000 Encounter for general adult medical examination without abnormal findings: Secondary | ICD-10-CM | POA: Diagnosis not present

## 2019-10-10 DIAGNOSIS — E785 Hyperlipidemia, unspecified: Secondary | ICD-10-CM | POA: Diagnosis not present

## 2019-10-10 DIAGNOSIS — N644 Mastodynia: Secondary | ICD-10-CM

## 2019-10-10 DIAGNOSIS — R209 Unspecified disturbances of skin sensation: Secondary | ICD-10-CM

## 2019-10-10 MED ORDER — ALBUTEROL SULFATE HFA 108 (90 BASE) MCG/ACT IN AERS: 2.0000 | INHALATION_SPRAY | Freq: Four times a day (QID) | RESPIRATORY_TRACT | 1 refills | Status: DC | PRN

## 2019-10-10 NOTE — Progress Notes (Signed)
Medicare Annual Preventive Care Visit  (initial annual wellness or annual wellness exam)  Virtual Visit via Video Note  I connected with Santina EvansCatherine by a video enabled telemedicine application and verified that I am speaking with the correct person using two identifiers.  Location patient: home Location provider:work or home office Persons participating in the virtual visit: patient, provider  Concerns and/or follow up today:  Has a PMH of hypertension, stress, Meniere's, hyperlipidemia, hypothyroidism, Sleep apnea (resolved w/ wt reduction), GERD and osteoporosis. She repleated B12 since last lab check with Dr. Hassan RowanKoberlein (PCP). She was having vibratory sensation in the top of the foot and the calf of the L leg which prompted this check. No pain or weakness. Going on for several months and not improving. No swelling or discoloration. No energy changes or unexplained wt loss. No fevers. She lost mother in September. Coping ok.  Tenderness in one little spot in the L breast for the last month. Deep pain behind the nipple. No appreciable lump, discharge or skin changes. Only hurts when she touches it.  Due for lipid check. Request refill on albuterol for EIB - reports current inhaler is expired when she checked it.  See HM section in Epic for other details of completed HM. See scanned documentation under Media Tab for further documentation HPI, health risk assessment. See Media Tab and Care Teams sections in Epic for other providers.  ROS: negative for report of fevers, unintentional weight loss, vision changes, vision loss, hearing loss or change, chest pain, sob, hemoptysis, melena, hematochezia, hematuria, genital discharge or lesions, falls, bleeding or bruising, loc, thoughts of suicide or self harm, memory loss  1.) Patient-completed health risk assessment  - completed and reviewed, see scanned documentation  2.) Review of Medical History: -PMH, PSH, Family History and current specialty  and care providers reviewed and updated and listed below  - see scanned in document in chart and below Patient Care Team: Wynn BankerKoberlein, Junell C, MD as PCP - General (Family Medicine) Jonelle SidleMcDowell, Samuel G, MD as PCP - Cardiology (Cardiology) Ermalinda BarriosKraus, Eric, MD as Consulting Physician (Otolaryngology) Cherlyn RobertsLupton, Frederick, MD as Consulting Physician (Dermatology)   Past Medical History:  Diagnosis Date  . Essential hypertension   . History of dermatitis   . History of migraine headaches   . Hyperlipemia 05/24/2016  . Hypothyroidism   . MVP (mitral valve prolapse)    Reported history  . Palpitations    Rare PVCs by Holter monitor April 2011   . Sleep apnea    Reportedly mild  . Vitamin D insufficiency 05/24/2016    Past Surgical History:  Procedure Laterality Date  . APPENDECTOMY    . BREAST LUMPECTOMY     Benign    Social History   Socioeconomic History  . Marital status: Married    Spouse name: Not on file  . Number of children: Not on file  . Years of education: Not on file  . Highest education level: Not on file  Occupational History  . Occupation: Development worker, [employer]    Employer: [employer]  Social Needs  . Financial resource strain: Not on file  . Food insecurity    Worry: Not on file    Inability: Not on file  . Transportation needs    Medical: Not on file    Non-medical: Not on file  Tobacco Use  . Smoking status: Never Smoker  . Smokeless tobacco: Never Used  Substance and Sexual Activity  . Alcohol use: No  . Drug use: No  .  Sexual activity: Yes  Lifestyle  . Physical activity    Days per week: Not on file    Minutes per session: Not on file  . Stress: Not on file  Relationships  . Social Herbalist on phone: Not on file    Gets together: Not on file    Attends religious service: Not on file    Active member of club or organization: Not on file    Attends meetings of clubs or organizations: Not on file    Relationship status: Not on file  . Intimate  partner violence    Fear of current or ex partner: Not on file    Emotionally abused: Not on file    Physically abused: Not on file    Forced sexual activity: Not on file  Other Topics Concern  . Not on file  Social History Narrative   Work or School: retired - used to work for [employer] Situation: lives with husband      Spiritual Beliefs: Christian      Lifestyle: no regular exercise; diet is healthy       Family History  Problem Relation Age of Onset  . Melanoma Father 63  . Stroke Mother   . Asthma Mother   . Stroke Maternal Grandfather   . Asthma Brother   . Deep vein thrombosis Brother 68       had back surgery with post op complications of blood clots which were fatal  . Thyroid disease Neg Hx     Current Outpatient Medications on File Prior to Visit  Medication Sig Dispense Refill  . albuterol (PROVENTIL HFA;VENTOLIN HFA) 108 (90 Base) MCG/ACT inhaler Inhale 2 puffs into the lungs every 6 (six) hours as needed. 1 Inhaler 0  . Cholecalciferol (VITAMIN D3 PO) Take 1,000 Units by mouth daily.    . Cyanocobalamin (B-12) 1000 MCG TABS Take by mouth daily.    Marland Kitchen levothyroxine (SYNTHROID) 75 MCG tablet Take 1 tablet by mouth once daily 90 tablet 0  . LORazepam (ATIVAN) 0.5 MG tablet Use 1/2 to 1 tablet as needed for panic attacks/anxiety 10 tablet 0  . metoprolol succinate (TOPROL-XL) 100 MG 24 hr tablet TAKE WITH OR IMMEDIATELY FOLLOWING A MEAL 90 tablet 0  . omeprazole (PRILOSEC) 20 MG capsule Take 20 mg by mouth daily.     No current facility-administered medications on file prior to visit.      3.) Review of functional ability and level of safety:  Any difficulty hearing?  See scanned documentation  History of falling?  See scanned documentation  Any trouble with IADLs - using a phone, using transportation, grocery shopping, preparing meals, doing housework, doing laundry, taking medications and managing money?  See scanned  documentation  Advance Directives? Discussed. She has paperwork but is working on filling it out.  See summary of recommendations in Patient Instructions below.  4.) Physical Exam VITALS per patient if applicable: Vitals:   95/28/41 0921  BP: 123/74  Pulse: (!) 57  Temp: (!) 97.3 F (36.3 C)   Estimated body mass index is 23.6 kg/m as calculated from the following:   Height as of 01/05/19: 5\' 7"  (1.702 m).   Weight as of this encounter: 150 lb 11.2 oz (68.4 kg).  EKG (optional): deferred  GENERAL: alert, oriented, appears well and in no acute distress; visual acuity grossly intact, full vision exam deferred due to pandemic and/or virtual encounter  HEENT:  atraumatic, conjunttiva clear, no obvious abnormalities on inspection of external nose and ears  NECK: normal movements of the head and neck  LUNGS: on inspection no signs of respiratory distress, breathing rate appears normal, no obvious gross SOB, gasping or wheezing  CV: no obvious cyanosis  MS: moves all visible extremities without noticeable abnormality  PSYCH/NEURO: pleasant and cooperative, no obvious depression or anxiety, speech and thought processing grossly intact, Cognitive function grossly intact  Depression screen [facility] 2/9 10/10/2019 10/04/2018 09/30/2017 12/11/2016  Decreased Interest 0 0 0 0  Down, Depressed, Hopeless 0 0 0 0  PHQ - 2 Score 0 0 0 0     See patient instructions for recommendations.  Education and counseling regarding the above review of health provided with a plan for the following: -see scanned patient completed form for further details -fall prevention strategies discussed - N/a, feels steady on her feet -healthy lifestyle discussed - eating healthy and trying to exercise -importance and resources for completing advanced directives discussed -see patient instructions below for any other recommendations provided  4)The following written screening schedule of preventive measures were  reviewed with assessment and plan made per below, orders and patient instructions:          Alcohol screening - no alchol     Obesity Screening and counseling done     STI screening (Hep C if born 1945-65) offered and per pt wishes - no concerns, pt declined     Tobacco Screening - no smoking       Pneumococcal (PPSV23 -one dose after 64, one before if risk factors), influenza yearly and hepatitis B vaccines (if high risk - end stage renal disease, IV drugs, homosexual men, live in home for mentally retarded, hemophilia receiving factors) ASSESSMENT/PLAN: done       Screening mammograph (yearly if >40) ASSESSMENT/PLAN: last mammo 12/2018      Screening Pap smear/pelvic exam (q2 years) ASSESSMENT/PLAN: she would like to set up a pelvic exam with Dr. Hassan Rowan (PCP)      Colorectal cancer screening (FOBT yearly or flex sig q4y or colonoscopy q10y or barium enema q4y) ASSESSMENT/PLAN: utd - cologuard done 11/ 2018      Diabetes outpatient self-management training services ASSESSMENT/PLAN: utd or done      Bone mass measurements(covered q2y if indicated - estrogen def, osteoporosis, hyperparathyroid, vertebral abnormalities, osteoporosis or steroids) ASSESSMENT/PLAN: utd, DEXA done 12/2018      Screening for glaucoma(q1y if high risk - diabetes, FH, AA and > 50 or hispanic and > 65) ASSESSMENT/PLAN: utd - reports saw her eye doctor for this in August 2020      Medical nutritional therapy for individuals with diabetes or renal disease ASSESSMENT/PLAN: n/a      Cardiovascular screening blood tests (lipids q5y) ASSESSMENT/PLAN: she will get lipids done when comes for visit with PCP      Diabetes screening tests ASSESSMENT/PLAN: see orders and labs   7.) Summary:  Medicare annual wellness visit, subsequent  Breast pain, left - Plan: MM Digital Diagnostic Unilat L, US BREAST LTD UNI LEFT INC AXILLA  Abnormal sensation of leg  Hyperlipidemia, unspecified hyperlipidemia  type  Exercise-induced asthma   -risk factors and conditions per above assessment were discussed and treatment, recommendations and referrals were offered per documentation above and orders and patient instructions. Limited given virtual visit and did recommend close follow up with PCP for several medical issues and she desires a pelvic exam.   Referred to neurology per pt preference for leg  sensation after discussion potential etiologies, options for evaluation, risks, precautions. She also agrees to follow up with PCP. Referred to breast center. Asked assistant to place referral per pt preference for evaluation of the breat pain. Follow up with PCP advised. Refill albuterol. Follow up Dr. KoberleHassan Rowan1 month for pelvic exam, follow up breast pain and for follow up leg sensation Considering shingles vaccine but will get at the pharmacy. Advised patient if the neurology appt, breast center appt and PCP appt details are not provided to her within the next several days, to call our office to check on the status of theses referrals. She agreed to do so.    Patient Instructions    Ms. Borchardt , Thank you for taking time to come for your Medicare Wellness Visit. I appreciate your ongoing commitment to your health goals. Please review the following plan we discussed and let me know if I can assist you in the future.   These are the goals we discussed: Goals   -lipid check, with office visit -We placed a referral for you as discussed for 1) the evaluation of the breast pain at the breast center and 2) the neurology referral for the leg issue. It usually takes about a few days to process the breast center referral and 1-2 weeks to process a neurology referral. If you have not heard from them regarding these appointment soon, please contact our office. -follow up with Dr. Hassan Rowan in about 1 month for the breast pain, leg discomfort and for the pelvic exam -continue a health diet and regular  exercise -complete advanced directives -consider the shingles vaccine  This is a list of the screening recommended for you and due dates:  Health Maintenance  Topic Date Due  . Cologuard (Stool DNA test)  10/12/2020  . Mammogram  12/21/2020  . DEXA scan (bone density measurement)  12/21/2020  . Tetanus Vaccine  10/01/2027  . Flu Shot  Completed  . Pneumonia vaccines  Completed  .  Hepatitis C: One time screening is recommended by Center for Disease Control  (CDC) for  adults born from [birth year] through 1965.   Addressed    Terressa Koyanagi, DO

## 2019-10-10 NOTE — Patient Instructions (Signed)
  Ms. Tracey Giles , Thank you for taking time to come for your Medicare Wellness Visit. I appreciate your ongoing commitment to your health goals. Please review the following plan we discussed and let me know if I can assist you in the future.   These are the goals we discussed: Goals   -lipid check, with office visit -We placed a referral for you as discussed for 1) the evaluation of the breast pain at the breast center and 2) the neurology referral for the leg issue. It usually takes about a few days to process the breast center referral and 1-2 weeks to process a neurology referral. If you have not heard from them regarding these appointment soon, please contact our office. -follow up with Dr. Ethlyn Gallery in about 1 month for the breast pain, leg discomfort and for the pelvic exam -continue a health diet and regular exercise -complete advanced directives -consider the shingles vaccine  This is a list of the screening recommended for you and due dates:  Health Maintenance  Topic Date Due  . Cologuard (Stool DNA test)  10/12/2020  . Mammogram  12/21/2020  . DEXA scan (bone density measurement)  12/21/2020  . Tetanus Vaccine  10/01/2027  . Flu Shot  Completed  . Pneumonia vaccines  Completed  .  Hepatitis C: One time screening is recommended by Center for Disease Control  (CDC) for  adults born from [birth year] through 1965.   Addressed

## 2019-10-16 ENCOUNTER — Telehealth: Payer: Self-pay | Admitting: Family Medicine

## 2019-10-16 NOTE — Telephone Encounter (Signed)
I spoke to patient to make a 1 mth f/u appt, however she stated she had not heard anything about the ultrasound or check on her leg that was being ordered.  I didn't make the f/u appt until I followed up further with CMA.  Patient also stated the [facility] called her to set up an appt for her breast pain, however she usually goes to [facility] so she didn't make the breast exam appt.

## 2019-10-16 NOTE — Telephone Encounter (Signed)
Per the last office, patient was informed a referral was placed to see a neurologist about her leg and someone will call with appt info.  New order completed for breast imaging and faxed to [facility] at [phone] and the pt was informed to call for an appt.

## 2019-10-18 ENCOUNTER — Ambulatory Visit: Payer: Medicare Other | Admitting: Neurology

## 2019-10-18 ENCOUNTER — Encounter: Payer: Self-pay | Admitting: Neurology

## 2019-10-18 ENCOUNTER — Other Ambulatory Visit: Payer: Self-pay

## 2019-10-18 VITALS — BP 155/79 | HR 60 | Temp 97.7°F | Ht 68.0 in | Wt 151.5 lb

## 2019-10-18 DIAGNOSIS — R200 Anesthesia of skin: Secondary | ICD-10-CM | POA: Insufficient documentation

## 2019-10-18 DIAGNOSIS — H8102 Meniere's disease, left ear: Secondary | ICD-10-CM

## 2019-10-18 DIAGNOSIS — E538 Deficiency of other specified B group vitamins: Secondary | ICD-10-CM | POA: Insufficient documentation

## 2019-10-18 DIAGNOSIS — R292 Abnormal reflex: Secondary | ICD-10-CM | POA: Insufficient documentation

## 2019-10-18 NOTE — Progress Notes (Signed)
[facility]  PATIENT: Tracey Giles DOB: June 07, 1951  REFERRING DOCTOR OR PCP: [name] SOURCE: Patient, notes from primary care  _________________________________   HISTORICAL  CHIEF COMPLAINT:  Chief Complaint  Patient presents with  . New Patient (Initial Visit)    RM 12, alone.  Internal referral for abnormal sensation of left leg. Sx started 5-6 months ago. Daily, constant. Denies any injury prior to sx.      HISTORY OF PRESENT ILLNESS:  I had the pleasure of seeing your patient, Tracey Giles, at [facility] for a neurologic consultation regarding her left leg paresthesias.    She is a 68 year old woman who reports abnormal sensations of the left leg for the past 5 to 6 months.  The onset was insidious and she reports a vibratory sensation and part of her left foot and in the calf.   The onset of symptoms was not precipitated by any unusual activity or injury. Compared to last month, symptoms are unchanged.    She notes the paresthesia more when laying or sitting and less when standing or walking.  She did not note clumsiness or loss of position sensation.   She denies any weakness and does not note any pain.  She denies symptoms in the opposite leg or either arm.  There is no change in her gait.  However, she has longstanding mild balance issues attributed to hr Meniere's disease.  Bladder function is doing well.   Vision is fine.     Of note, her vitamin B12 was low at 135 when checked on 06/26/2019 and she had B12 injections.  When B12 was rechecked 10/05/2019 it was back in the normal range at 532.  She has vertigo off and on but 15 years ago, vertigo was intense and associated with N/V.    She has enver had GI surgery.  No inflammatory bowel disorder.    REVIEW OF SYSTEMS: Constitutional: No fevers, chills, sweats, or change in appetite Eyes: No visual changes, double vision, eye pain Ear, nose and throat: No  hearing loss, ear pain, nasal congestion, sore throat Cardiovascular: No chest pain, palpitations Respiratory: No shortness of breath at rest or with exertion.   No wheezes GastrointestinaI: No nausea, vomiting, diarrhea, abdominal pain, fecal incontinence Genitourinary: No dysuria, urinary retention or frequency.  No nocturia. Musculoskeletal: No neck pain, back pain Integumentary: No rash, pruritus, skin lesions Neurological: as above Psychiatric: No depression at this time.  No anxiety Endocrine: No palpitations, diaphoresis, change in appetite, change in weigh or increased thirst Hematologic/Lymphatic: No anemia, purpura, petechiae. Allergic/Immunologic: No itchy/runny eyes, nasal congestion, recent allergic reactions, rashes  ALLERGIES: Allergies  Allergen Reactions  . Tums [Calcium Carbonate Antacid] Hives    Burning sensation in mouth, rash on torso  . Cephalexin   . Penicillins     REACTION: unspecified  . Sulfamethoxazole-Trimethoprim     REACTION: unspecified    HOME MEDICATIONS:  Current Outpatient Medications:  .  albuterol (VENTOLIN HFA) 108 (90 Base) MCG/ACT inhaler, Inhale 2 puffs into the lungs every 6 (six) hours as needed., Disp: 8.5 g, Rfl: 1 .  Cholecalciferol (VITAMIN D3 PO), Take 1,000 Units by mouth daily., Disp: , Rfl:  .  Cyanocobalamin (B-12) 1000 MCG TABS, Take by mouth daily., Disp: , Rfl:  .  levothyroxine (SYNTHROID) 75 MCG tablet, Take 1 tablet by mouth once daily, Disp: 90 tablet, Rfl: 0 .  LORazepam (ATIVAN) 0.5 MG tablet, Use 1/2 to 1 tablet as needed for panic  attacks/anxiety, Disp: 10 tablet, Rfl: 0 .  metoprolol succinate (TOPROL-XL) 100 MG 24 hr tablet, TAKE WITH OR IMMEDIATELY FOLLOWING A MEAL, Disp: 90 tablet, Rfl: 0 .  omeprazole (PRILOSEC) 20 MG capsule, Take 20 mg by mouth daily., Disp: , Rfl:   PAST MEDICAL HISTORY: Past Medical History:  Diagnosis Date  . Essential hypertension   . History of dermatitis   . History of migraine  headaches   . Hyperlipemia 05/24/2016  . Hypothyroidism   . MVP (mitral valve prolapse)    Reported history  . Palpitations    Rare PVCs by Holter monitor April 2011   . Sleep apnea    Reportedly mild  . Vitamin D insufficiency 05/24/2016    PAST SURGICAL HISTORY: Past Surgical History:  Procedure Laterality Date  . APPENDECTOMY    . BREAST LUMPECTOMY     Benign    FAMILY HISTORY: Family History  Problem Relation Age of Onset  . Melanoma Father 73  . Stroke Mother   . Asthma Mother   . Stroke Maternal Grandfather   . Asthma Brother   . Deep vein thrombosis Brother 14       had back surgery with post op complications of blood clots which were fatal  . Thyroid disease Neg Hx     SOCIAL HISTORY:  Social History   Socioeconomic History  . Marital status: Married    Spouse name: Not on file  . Number of children: 0  . Years of education: 37  . Highest education level: Not on file  Occupational History  . Occupation: Landscape architect: [employer]  . Financial resource strain: Not on file  . Food insecurity    Worry: Not on file    Inability: Not on file  . Transportation needs    Medical: Not on file    Non-medical: Not on file  Tobacco Use  . Smoking status: Never Smoker  . Smokeless tobacco: Never Used  Substance and Sexual Activity  . Alcohol use: No  . Drug use: No  . Sexual activity: Yes  Lifestyle  . Physical activity    Days per week: Not on file    Minutes per session: Not on file  . Stress: Not on file  Relationships  . Social Herbalist on phone: Not on file    Gets together: Not on file    Attends religious service: Not on file    Active member of club or organization: Not on file    Attends meetings of clubs or organizations: Not on file    Relationship status: Not on file  . Intimate partner violence    Fear of current or ex partner: Not on file    Emotionally abused: Not on file    Physically abused: Not on  file    Forced sexual activity: Not on file  Other Topics Concern  . Not on file  Social History Narrative   Work or School: retired - used to work for [employer] Situation: lives with husband      Spiritual Beliefs: Christian      Lifestyle: no regular exercise; diet is healthy      Right handed    No caffeine use:      PHYSICAL EXAM  Vitals:   10/18/19 0944  BP: (!) 155/79  Pulse: 60  Temp: 97.7 F (36.5 C)  Weight: 151 lb 8 oz (  68.7 kg)  Height:  (1.727 m)    Body mass index is 23.04 kg/m.   General: The patient is well-developed and well-nourished and in no acute distress  HEENT:  Head is [place]/AT.  Sclera are anicteric.     Neck: No carotid bruits are noted.  The neck is nontender.  Cardiovascular: The heart has a regular rate and rhythm with a normal S1 and S2. There were no murmurs, gallops or rubs.    Skin: Extremities are without rash or  edema.  Musculoskeletal:  Back is nontender  Neurologic Exam  Mental status: The patient is alert and oriented x 3 at the time of the examination. The patient has apparent normal recent and remote memory, with an apparently normal attention span and concentration ability.   Speech is normal.  Cranial nerves: Extraocular movements are full.    Facial symmetry is present. There is good facial sensation to soft touch bilaterally.Facial strength is normal.  Trapezius and sternocleidomastoid strength is normal. No dysarthria is noted. No obvious hearing deficits are noted.  Motor:  Muscle bulk is normal.   Tone is normal. Strength is  5 / 5 in all 4 extremities.   Sensory: Sensory testing is intact to pinprick, soft touch and vibration sensation in all 4 extremities.  Coordination: Cerebellar testing reveals good finger-nose-finger and heel-to-shin bilaterally.  Gait and station: Station is normal.   Gait is mildly wide and tandem gait is moderately wide. Romberg is negative.   Reflexes: Deep  tendon reflexes are symmetric and normal in the arms but 3+ at the knees with crossed abductor responses and increased at ankles without clonus.   Plantar responses are flexor.    DIAGNOSTIC DATA (LABS, IMAGING, TESTING) - I reviewed patient records, labs, notes, testing and imaging myself where available.  Lab Results  Component Value Date   WBC 7.8 06/26/2019   HGB 14.3 06/26/2019   HCT 42.6 06/26/2019   MCV 92.5 06/26/2019   PLT 285.0 06/26/2019      Component Value Date/Time   NA 140 06/26/2019 1249   K 4.5 06/26/2019 1249   CL 106 06/26/2019 1249   CO2 25 06/26/2019 1249   GLUCOSE 95 06/26/2019 1249   BUN 15 06/26/2019 1249   CREATININE 0.83 06/26/2019 1249   CALCIUM 8.9 06/26/2019 1249   PROT 6.7 06/26/2019 1249   ALBUMIN 4.2 06/26/2019 1249   AST 15 06/26/2019 1249   ALT 8 06/26/2019 1249   ALKPHOS 102 06/26/2019 1249   BILITOT 0.4 06/26/2019 1249   GFRNONAA 75.80 10/13/2010 0812   GFRAA 95 09/25/2008 0900   Lab Results  Component Value Date   CHOL 183 10/04/2018   HDL 47.70 10/04/2018   LDLCALC 110 (H) 10/04/2018   TRIG 130.0 10/04/2018   CHOLHDL 4 10/04/2018   Lab Results  Component Value Date   HGBA1C 5.6 10/04/2018   Lab Results  Component Value Date   VITAMINB12 532 10/05/2019   Lab Results  Component Value Date   TSH 1.48 06/26/2019       ASSESSMENT AND PLAN  Numbness  Abnormal reflex - Plan: MR CERVICAL SPINE WO CONTRAST, MR THORACIC SPINE WO CONTRAST  Meniere's disease of left ear  B12 deficiency   In summary, Tracey Giles is a 68 year old woman with left leg paresthesias that began 5 to 6 months ago.  On examination, she actually did not have any numbness though she was feeling the sensation.  However, she did have increased reflexes at both  knees with crossed abductor responses.  I discussed with her that the numbness and abnormal reflexes could be from her low vitamin B12 and that has already been corrected.  However, because of  the reflexes I feel we need to also investigate the possibility of a compressive myelopathy.  Therefore, we are going to check MRI of the cervical and thoracic spine.  This will allow us to determine if there is any intrinsic or extrinsic myelopathy.  Further treatment or referral may be necessary based on the response.  As she does not have any pain in the paresthesias are not too uncomfortable will hold off on any medical treatment at this time.  If the MRI is abnormal and symptoms do not improve, will consider obtaining a nerve conduction study and EMG.  She will return as needed based on the results of the studies and if she has new or worsening symptoms.  Thank you for asking me to see Tracey Giles.  Please let me know if I can be of further assistance with her or other patients in the future.  Gioia Ranes A. Epimenio FootSater, MD, [facility],FAAN 10/18/2019, 10:27 AM Certified in Neurology, Clinical Neurophysiology, Sleep Medicine and Neuroimaging  [facility] [address], [address] [place], [place] [address] [phone]

## 2019-11-12 ENCOUNTER — Other Ambulatory Visit: Payer: Self-pay

## 2019-11-12 ENCOUNTER — Ambulatory Visit
Admission: RE | Admit: 2019-11-12 | Discharge: 2019-11-12 | Disposition: A | Discharge status: Home / Self Care | Payer: Medicare Other | Source: Ambulatory Visit | Attending: Neurology | Admitting: Neurology

## 2019-11-12 DIAGNOSIS — R292 Abnormal reflex: Secondary | ICD-10-CM

## 2019-11-14 ENCOUNTER — Telehealth: Payer: Self-pay | Admitting: *Deleted

## 2019-11-14 ENCOUNTER — Other Ambulatory Visit: Payer: Self-pay | Admitting: Family Medicine

## 2019-11-14 NOTE — Telephone Encounter (Signed)
-----   Message from Britt Bottom, MD sent at 11/13/2019  4:35 PM EST ----- Please let her know that the MRI of the spine does show some degenerative changes and she has mild spinal stenosis at C3-C4.  However, this is not severe enough to cause numbness though my course of neck pain.    Her spinal cord looked normal.

## 2019-11-14 NOTE — Telephone Encounter (Signed)
Called and relayed MRI results per Dr. Felecia Shelling note. She verbalized understanding. She will call back if she has any new/worsening sx. She will f/u as needed.

## 2019-11-16 ENCOUNTER — Other Ambulatory Visit: Payer: Medicare Other

## 2019-11-21 ENCOUNTER — Other Ambulatory Visit: Payer: Self-pay | Admitting: Radiology

## 2020-01-01 DIAGNOSIS — Z1231 Encounter for screening mammogram for malignant neoplasm of breast: Secondary | ICD-10-CM | POA: Diagnosis not present

## 2020-01-01 LAB — HM MAMMOGRAPHY

## 2020-01-04 ENCOUNTER — Encounter: Payer: Self-pay | Admitting: Family Medicine

## 2020-01-16 ENCOUNTER — Ambulatory Visit: Payer: Medicare PPO | Admitting: Family Medicine

## 2020-01-16 ENCOUNTER — Encounter: Payer: Self-pay | Admitting: Family Medicine

## 2020-01-16 ENCOUNTER — Other Ambulatory Visit: Payer: Self-pay

## 2020-01-16 VITALS — BP 132/88 | HR 70 | Temp 97.4°F | Ht 68.0 in | Wt 152.7 lb

## 2020-01-16 DIAGNOSIS — L0231 Cutaneous abscess of buttock: Secondary | ICD-10-CM | POA: Diagnosis not present

## 2020-01-16 MED ORDER — DOXYCYCLINE HYCLATE 100 MG PO CAPS: 100.0000 mg | ORAL_CAPSULE | Freq: Two times a day (BID) | ORAL | 0 refills | Status: DC

## 2020-01-16 NOTE — Progress Notes (Signed)
  Subjective:     Patient ID: Tracey Giles, female   DOB: 23-Jun-1951, 69 y.o.   MRN: [id number]  HPI Patient is seen with approximately 2-week history of painful swollen area just inferior and lateral to the vagina region.  This is actually down almost on the buttock.  She has been doing some warm water soaks 2-3 times daily and this has gone down some in size and has reduced in soreness over the past few days.  She denies any fevers or chills.  No history of MRSA.  She has reported allergy to penicillin, Keflex, and Septra.  Past Medical History:  Diagnosis Date  . Essential hypertension   . History of dermatitis   . History of migraine headaches   . Hyperlipemia 05/24/2016  . Hypothyroidism   . MVP (mitral valve prolapse)    Reported history  . Palpitations    Rare PVCs by Holter monitor April 2011   . Sleep apnea    Reportedly mild  . Vitamin D insufficiency 05/24/2016   Past Surgical History:  Procedure Laterality Date  . APPENDECTOMY    . BREAST LUMPECTOMY     Benign    reports that she has never smoked. She has never used smokeless tobacco. She reports that she does not drink alcohol or use drugs. family history includes Asthma in her brother and mother; Deep vein thrombosis (age of onset: 44) in her brother; Melanoma (age of onset: 62) in her father; Stroke in her maternal grandfather and mother. Allergies  Allergen Reactions  . Tums [Calcium Carbonate Antacid] Hives    Burning sensation in mouth, rash on torso  . Cephalexin   . Penicillins     REACTION: unspecified  . Sulfamethoxazole-Trimethoprim     REACTION: unspecified     Review of Systems  Constitutional: Negative for chills and fever.       Objective:   Physical Exam Vitals reviewed.  Constitutional:      Appearance: Normal appearance.  Cardiovascular:     Rate and Rhythm: Normal rate and regular rhythm.  Skin:    Comments: She has small area of erythema and very mild swelling about 2 cm  inferior and lateral to the introitus of the vagina. There is a small amount of pus draining from this.  There is no fluctuance.  Minimal induration.  Minimally tender.  Area of erythema is approximately 1 x 1 cm.  Neurological:     Mental Status: She is alert.        Assessment:     Draining abscess left pelvic region.  This does not correspond with a Bartholin's abscess.  This is already draining and appears to be improving clinically according to patient.  Allergies reported to penicillin and sulfa as above    Plan:     -Continue warm water soaks couple times daily -Doxycycline 100 mg twice daily -Could consider anaerobic coverage with Flagyl if not continuing to improve -We did not see indication for incision and drainage since this was nonfluctuant and already draining.  We did not get a sense of deeper pocket of abscess  Kristian Covey MD [facility]

## 2020-01-16 NOTE — Patient Instructions (Signed)
Continue to do warm water soaks at least daily  You have a small abscess that is already draining and I think will continue to get better.

## 2020-01-16 NOTE — Telephone Encounter (Signed)
I called the pt and scheduled an appt for today with Dr Caryl Never to arrive at 3:30pm as PCP is out of the office.

## 2020-01-26 ENCOUNTER — Encounter: Payer: Self-pay | Admitting: Family Medicine

## 2020-01-26 NOTE — Telephone Encounter (Signed)
I have the pt scheduled at your next available in office slot which is April 7th at 11:30am. Pt is wondering if she can be worked in sooner than that?  You can send it back to me or reach the pt at [phone]

## 2020-01-29 NOTE — Telephone Encounter (Signed)
Next available opening is a virtual on 3/12.  Message sent to PCP.

## 2020-01-29 NOTE — Telephone Encounter (Signed)
I would be ok with 1200 on this weds if she would like.

## 2020-01-29 NOTE — Telephone Encounter (Signed)
Spoke with the pt and she agreed to the visit below.  Message sent to Practice Administrator to add time slot.

## 2020-01-31 ENCOUNTER — Other Ambulatory Visit (HOSPITAL_COMMUNITY)
Admission: RE | Admit: 2020-01-31 | Discharge: 2020-01-31 | Disposition: A | Discharge status: Home / Self Care | Payer: Medicare PPO | Source: Ambulatory Visit | Attending: Family Medicine | Admitting: Family Medicine

## 2020-01-31 ENCOUNTER — Ambulatory Visit (INDEPENDENT_AMBULATORY_CARE_PROVIDER_SITE_OTHER): Payer: Medicare PPO | Admitting: Family Medicine

## 2020-01-31 ENCOUNTER — Encounter: Payer: Self-pay | Admitting: Family Medicine

## 2020-01-31 ENCOUNTER — Other Ambulatory Visit: Payer: Self-pay

## 2020-01-31 ENCOUNTER — Other Ambulatory Visit: Payer: Medicare PPO

## 2020-01-31 VITALS — BP 140/80 | HR 64 | Temp 97.2°F | Ht 68.0 in | Wt 151.5 lb

## 2020-01-31 DIAGNOSIS — Z124 Encounter for screening for malignant neoplasm of cervix: Secondary | ICD-10-CM | POA: Insufficient documentation

## 2020-01-31 DIAGNOSIS — Z1151 Encounter for screening for human papillomavirus (HPV): Secondary | ICD-10-CM | POA: Insufficient documentation

## 2020-01-31 DIAGNOSIS — Z01419 Encounter for gynecological examination (general) (routine) without abnormal findings: Secondary | ICD-10-CM

## 2020-01-31 DIAGNOSIS — L0291 Cutaneous abscess, unspecified: Secondary | ICD-10-CM

## 2020-01-31 DIAGNOSIS — Z780 Asymptomatic menopausal state: Secondary | ICD-10-CM | POA: Insufficient documentation

## 2020-01-31 MED ORDER — MUPIROCIN 2 % EX OINT: 1.0000 "application " | TOPICAL_OINTMENT | Freq: Two times a day (BID) | CUTANEOUS | 0 refills | Status: DC

## 2020-01-31 NOTE — Progress Notes (Signed)
Avyonna Wagoner [facility] DOB: [birth year] Encounter date: 01/31/2020  This is a 69 y.o. female who presents with Chief Complaint  Patient presents with  . Abscess    History of present illness: Saw Dr. Elease Hashimoto on 01/16/2020 and diagnosed with abscess in the left pelvic region that was draining.  Doxycycline given and advised for follow-up.  Still there; hasn't changed since last visit. Some days feels better, but then seems to fill up again. Pressure sensation is in area of abscess. Didn't feel better after antibiotics.   No fevers or chills.  Does not feel like it is still draining.   Allergies  Allergen Reactions  . Tums [Calcium Carbonate Antacid] Hives    Burning sensation in mouth, rash on torso  . Cephalexin   . Penicillins     REACTION: unspecified  . Sulfamethoxazole-Trimethoprim     REACTION: unspecified   Current Meds  Medication Sig  . albuterol (VENTOLIN HFA) 108 (90 Base) MCG/ACT inhaler Inhale 2 puffs into the lungs every 6 (six) hours as needed.  . Cholecalciferol (VITAMIN D3 PO) Take 1,000 Units by mouth daily.  . Cyanocobalamin (B-12) 1000 MCG TABS Take by mouth daily.  Marland Kitchen doxycycline (VIBRAMYCIN) 100 MG capsule Take 1 capsule (100 mg total) by mouth 2 (two) times daily.  Marland Kitchen levothyroxine (SYNTHROID) 75 MCG tablet Take 1 tablet by mouth once daily  . LORazepam (ATIVAN) 0.5 MG tablet Use 1/2 to 1 tablet as needed for panic attacks/anxiety  . metoprolol succinate (TOPROL-XL) 100 MG 24 hr tablet TAKE WITH A MEAL OR  IMMEDIATELY  FOLLOWING  A  MEAL.  Marland Kitchen omeprazole (PRILOSEC) 20 MG capsule Take 20 mg by mouth daily.    Review of Systems  Constitutional: Negative for chills, fatigue and fever.  Respiratory: Negative for cough, chest tightness, shortness of breath and wheezing.   Cardiovascular: Negative for chest pain, palpitations and leg swelling.  Skin: Positive for color change (skin looks red/blue) and wound.    Objective:  BP 140/80 (BP Location: Left Arm,  Patient Position: Sitting, Cuff Size: Large)   Pulse 64   Temp (!) 97.2 F (36.2 C) (Temporal)   Ht 5\' 8"  (1.727 m)   Wt 151 lb 8 oz (68.7 kg)   SpO2 98%   BMI 23.04 kg/m   Weight: 151 lb 8 oz (68.7 kg)   BP Readings from Last 3 Encounters:  01/31/20 140/80  01/16/20 132/88  10/18/19 (!) 155/79   Wt Readings from Last 3 Encounters:  01/31/20 151 lb 8 oz (68.7 kg)  01/16/20 152 lb 11.2 oz (69.3 kg)  10/18/19 151 lb 8 oz (68.7 kg)    Physical Exam Exam conducted with a chaperone present.  Constitutional:      Appearance: Normal appearance. She is not ill-appearing.  Pulmonary:     Effort: Pulmonary effort is normal.  Genitourinary:    Exam position: Supine.     Vagina: Normal.     Cervix: Erythema and eversion present.     Uterus: Normal.      Adnexa:        Left: Fullness present.        Comments: There is a superficial abscess left labia.  It is tender to the touch.  No significant erythema or surrounding erythema.  There is some mid left lower abdominal fullness, although no specific abnormalities palpated.  Patient does have some tenderness with palpation of the left lower abdomen. Neurological:     Mental Status: She is alert.   Incision  and drainage procedure: After verbal consent, vulvar skin cleansed with ChloraPrep.  Lidocaine with 1% epi 1 cc injected in area of fluctuant abscess approximately 1.25 cm in size.  Using an 11 blade scalpel, area opened.  Single 0.3 cm calcified tissue relieved.  There was small amount of blood and purulent discharge.  Patient tolerated procedure well and had some immediate improvement with palpation of this area.  Abscess does not feel to be deeper than the superficial area.  No internal tenderness on exam.  Assessment/Plan  1. Abscess Relieved in the office today.  She did have improvement in symptoms, and we did culture this area.  I will report back to her and check in once we get culture reports back.  I do not feel she needs  to be on antibiotics at this time, as there are no surrounding redness or induration.  I feel that irritation was only present due to calcification that was within wound. - mupirocin ointment (BACTROBAN) 2 %; Place 1 application into the nose 2 (two) times daily.  Dispense: 22 g; Refill: 0  2. Pap smear, low-risk We will get in touch with her once we get Pap smear results.  There was some irritation to the cervix.  There is also some fullnesstenderness on bimanual exam to the left side.  Consider pelvic ultrasound pending Pap smear results.   Return if symptoms worsen or fail to improve.    [name], MD

## 2020-01-31 NOTE — Patient Instructions (Addendum)
Warm soaks (sitz baths) daily to twice daily for next week to help with healing. Wash abscess area with dial soap. If feeling irritated; ok to use antibiotic ointment to area. Please let me know if any increased discomfort or pain. I will be in touch with you when I get pap smear results.

## 2020-02-02 LAB — CYTOLOGY - PAP
Comment: NEGATIVE
Diagnosis: NEGATIVE
High risk HPV: NEGATIVE

## 2020-02-03 LAB — WOUND CULTURE
MICRO NUMBER:: 10183583
RESULT:: NO GROWTH
SPECIMEN QUALITY:: ADEQUATE

## 2020-02-20 ENCOUNTER — Other Ambulatory Visit: Payer: Self-pay | Admitting: Family Medicine

## 2020-03-12 ENCOUNTER — Other Ambulatory Visit: Payer: Self-pay

## 2020-03-13 ENCOUNTER — Ambulatory Visit: Payer: Medicare PPO | Admitting: Family Medicine

## 2020-03-13 ENCOUNTER — Other Ambulatory Visit: Payer: Self-pay | Admitting: Neurology

## 2020-03-13 ENCOUNTER — Encounter: Payer: Self-pay | Admitting: Family Medicine

## 2020-03-13 ENCOUNTER — Ambulatory Visit (INDEPENDENT_AMBULATORY_CARE_PROVIDER_SITE_OTHER): Payer: Medicare PPO | Admitting: Family Medicine

## 2020-03-13 VITALS — BP 120/80 | HR 62 | Temp 98.0°F | Ht 68.0 in | Wt 149.8 lb

## 2020-03-13 DIAGNOSIS — J309 Allergic rhinitis, unspecified: Secondary | ICD-10-CM | POA: Diagnosis not present

## 2020-03-13 DIAGNOSIS — M26621 Arthralgia of right temporomandibular joint: Secondary | ICD-10-CM | POA: Diagnosis not present

## 2020-03-13 DIAGNOSIS — E538 Deficiency of other specified B group vitamins: Secondary | ICD-10-CM

## 2020-03-13 DIAGNOSIS — H8102 Meniere's disease, left ear: Secondary | ICD-10-CM

## 2020-03-13 DIAGNOSIS — R350 Frequency of micturition: Secondary | ICD-10-CM | POA: Diagnosis not present

## 2020-03-13 DIAGNOSIS — E039 Hypothyroidism, unspecified: Secondary | ICD-10-CM | POA: Diagnosis not present

## 2020-03-13 DIAGNOSIS — R200 Anesthesia of skin: Secondary | ICD-10-CM

## 2020-03-13 DIAGNOSIS — R1900 Intra-abdominal and pelvic swelling, mass and lump, unspecified site: Secondary | ICD-10-CM | POA: Diagnosis not present

## 2020-03-13 DIAGNOSIS — E785 Hyperlipidemia, unspecified: Secondary | ICD-10-CM | POA: Diagnosis not present

## 2020-03-13 DIAGNOSIS — J45990 Exercise induced bronchospasm: Secondary | ICD-10-CM | POA: Diagnosis not present

## 2020-03-13 LAB — URINALYSIS
Bilirubin Urine: NEGATIVE
Hgb urine dipstick: NEGATIVE
Ketones, ur: NEGATIVE
Leukocytes,Ua: NEGATIVE
Nitrite: NEGATIVE
Specific Gravity, Urine: >=1.03 — AB (ref 1.000–1.030)
Total Protein, Urine: NEGATIVE
Urine Glucose: NEGATIVE
Urobilinogen, UA: 0.2 (ref 0.0–1.0)
pH: 5.5 (ref 5.0–8.0)

## 2020-03-13 MED ORDER — SYNTHROID 75 MCG PO TABS: 75.0000 ug | ORAL_TABLET | Freq: Every day | ORAL | 1 refills | Status: DC

## 2020-03-13 NOTE — Patient Instructions (Signed)

## 2020-03-13 NOTE — Progress Notes (Addendum)
Sherri Mcarthy [facility] DOB: July 02, 1951 Encounter date: 03/13/2020  This is a 69 y.o. female who presents with Chief Complaint  Patient presents with  . Follow-up    History of present illness: Abscess did well after last visit.   Still has some fullness sensation in vaginal or rectal area. Every once in awhile is painful. Feels like weak muscles.   Still has some vibration sensation in calves and feet. Notes it right now. Present all the time. Worse in morning. Is aggravating to her.   Still some jaw pain. Now that she has had COVID injections she will return to dentist for eval. Jaw bone still tender.   Notes vibration in feet - feels it when she is sitting in evening. With feet on floor feels like floor is vibrating. Feet not numb/tingly. Notes more on left side. Happening for a couple of months. Not had this sensation in hands. If she walks around it goes away. Notes with sitting/lying down. No cramping in muscles. No noted weakness.   Mother passed in September.  BJS:[id number] by cardiology in Jan after chest pressure. Stable.  Sleep apnea: feels this resolved with 30lb weight loss. Sleeps well, rested in morning.   Allergic rhinitis: claritin, flonase. Has seen pulm in past (2017) for dyspnea/upper airway cough syndrome. Allergies are messing with her now.   Exercise induced asthma: does still get this on occasion. Just with activity.   GERD:prilosec 20mg  daily. Just uses half tab daily which controls symptoms.   Hypothyroid: for last year since switching synthroid to generic she is getting hives. Tends to occur with new rx; and notes for first 1-2 weeks with starting a new bottle of medication. She gets itching, worse on chest, red bumps, and then they subside. No swelling in tongue/no problems breathing. Never had this issue with name brand synthroid.   Meniere's disease: occasional vertigo. No bad spells in a few months, but feels dizzy every couple of months. Balance  is issue all the time. Just takes time and makes sure feet are well planted before getting moving.   Osteoporosis:takes some vitamin D, calcium intake through diet; doesn't want to be on medications. She continues to exercise on regular basis.   HL: diet controlled.  [facility] Does see them regularly.   Allergies  Allergen Reactions  . Tums [Calcium Carbonate Antacid] Hives    Burning sensation in mouth, rash on torso  . Cephalexin   . Penicillins     REACTION: unspecified  . Sulfamethoxazole-Trimethoprim     REACTION: unspecified   Current Meds  Medication Sig  . albuterol (VENTOLIN HFA) 108 (90 Base) MCG/ACT inhaler Inhale 2 puffs into the lungs every 6 (six) hours as needed.  . Cholecalciferol (VITAMIN D3 PO) Take 1,000 Units by mouth daily.  . Cyanocobalamin (B-12) 1000 MCG TABS Take by mouth daily.  doxycycline (VIBRAMYCIN) 100 MG capsule Take 1 capsule (100 mg total) by mouth 2 (two) times daily.  Marland Kitchen LORazepam (ATIVAN) 0.5 MG tablet Use 1/2 to 1 tablet as needed for panic attacks/anxiety  . metoprolol succinate (TOPROL-XL) 100 MG 24 hr tablet TAKE 1 TABLET BY MOUTH ONCE DAILY WITH A MEAL OR  IMMEDIATELY  FOLLOWING  A  MEAL  . mupirocin ointment (BACTROBAN) 2 % Place 1 application into the nose 2 (two) times daily.  Marland Kitchen omeprazole (PRILOSEC) 20 MG capsule Take 20 mg by mouth daily.  . [DISCONTINUED] levothyroxine (SYNTHROID) 75 MCG tablet Take 1 tablet by  mouth once daily    Review of Systems  Constitutional: Negative for chills, fatigue and fever.  Respiratory: Negative for cough, chest tightness, shortness of breath and wheezing.   Cardiovascular: Negative for chest pain, palpitations and leg swelling.  Gastrointestinal: Positive for abdominal pain. Negative for abdominal distention, anal bleeding, blood in stool, constipation, diarrhea, nausea and vomiting.  Musculoskeletal: Negative for back pain.  Skin: Positive for rash (with new  generic synthroid).  Neurological: Positive for dizziness and numbness. Negative for weakness.    Objective:  BP 120/80 (BP Location: Left Arm, Patient Position: Sitting, Cuff Size: Normal)   Pulse 62   Temp 98 F (36.7 C) (Temporal)   Ht 5\' 8"  (1.727 m)   Wt 149 lb 12.8 oz (67.9 kg)   BMI 22.78 kg/m   Weight: 149 lb 12.8 oz (67.9 kg)   BP Readings from Last 3 Encounters:  03/13/20 120/80  01/31/20 140/80  01/16/20 132/88   Wt Readings from Last 3 Encounters:  03/13/20 149 lb 12.8 oz (67.9 kg)  01/31/20 151 lb 8 oz (68.7 kg)  01/16/20 152 lb 11.2 oz (69.3 kg)    Physical Exam Constitutional:      General: She is not in acute distress.    Appearance: She is well-developed.  HENT:     Mouth/Throat:     Mouth: Mucous membranes are moist.     Pharynx: Oropharynx is clear.     Comments: No obvious tooth abnormalities or dental abnormalities noted.  She does have significant TMJ tenderness on the right only. Cardiovascular:     Rate and Rhythm: Normal rate and regular rhythm.     Heart sounds: Normal heart sounds. No murmur. No friction rub.  Pulmonary:     Effort: Pulmonary effort is normal. No respiratory distress.     Breath sounds: Normal breath sounds. No wheezing or rales.  Abdominal:     General: Abdomen is flat. Bowel sounds are normal. There is no distension.     Palpations: Abdomen is soft.     Tenderness: There is abdominal tenderness in the left lower quadrant. There is no right CVA tenderness, left CVA tenderness, guarding or rebound. Negative signs include Murphy's sign and McBurney's sign.  Musculoskeletal:     Right lower leg: No edema.     Left lower leg: No edema.  Neurological:     Mental Status: She is alert and oriented to person, place, and time.  Psychiatric:        Behavior: Behavior normal.     Assessment/Plan  1. Urinary frequency Mentions occasionally up four times a night to urinate. No discomfort with urination.  - Urinalysis;  Future - Culture, Urine; Future - Culture, Urine - Urinalysis  2. Pelvic fullness Will complete 03/15/20 for further evaluation. LLQ tenderness that is reproducible on exam. Further eval pending [place] results. - US Pelvic Complete With Transvaginal; Future - CBC with Differential/Platelet; Future - Comprehensive metabolic panel; Future  3. Allergic rhinitis, unspecified seasonality, unspecified trigger Currently well controlled.   4. Exercise-induced asthma Well controlled. Has good allergy control.   5. Hypothyroidism, unspecified type Having rashes  - TSH; Future  6. Meniere's disease of left ear Stable; patient manages symptoms well.  7. Hyperlipidemia, unspecified hyperlipidemia type - Lipid panel; Future  8. B12 deficiency Taking replacement. Last levels were normal in 09/2019. - Vitamin B12; Future  9. Arthralgia of right temporomandibular joint Demonstrated tmj release, but encouraged folllow up with dentist for further evaluation.  10. Numbness:  Initial eval with neurology included MR cervical and thoracic spine. Patient would like to pursue additional eval to pinpoint cause of numbness which bothers her on regular basis. I will cc neurology on this note.   Return for pending imaging/labs.    Micheline Rough, MD

## 2020-03-14 LAB — URINE CULTURE
MICRO NUMBER:: 10337143
Result:: NO GROWTH
SPECIMEN QUALITY:: ADEQUATE

## 2020-03-20 ENCOUNTER — Ambulatory Visit
Admission: RE | Admit: 2020-03-20 | Discharge: 2020-03-20 | Disposition: A | Discharge status: Home / Self Care | Payer: Medicare PPO | Source: Ambulatory Visit | Attending: Family Medicine | Admitting: Family Medicine

## 2020-03-20 DIAGNOSIS — R102 Pelvic and perineal pain: Secondary | ICD-10-CM | POA: Diagnosis not present

## 2020-03-20 DIAGNOSIS — R1900 Intra-abdominal and pelvic swelling, mass and lump, unspecified site: Secondary | ICD-10-CM

## 2020-03-21 ENCOUNTER — Telehealth: Payer: Self-pay | Admitting: Family Medicine

## 2020-03-21 ENCOUNTER — Other Ambulatory Visit: Payer: Self-pay | Admitting: *Deleted

## 2020-03-21 DIAGNOSIS — N840 Polyp of corpus uteri: Secondary | ICD-10-CM

## 2020-03-21 NOTE — Telephone Encounter (Signed)
Results note completed.

## 2020-03-21 NOTE — Telephone Encounter (Signed)
Diane from [facility] wanted to make sure that we see the Transvaginal Imaging Report and to notify the CMA to review it ASAP.

## 2020-04-02 ENCOUNTER — Ambulatory Visit: Payer: Medicare PPO | Admitting: Obstetrics & Gynecology

## 2020-04-03 ENCOUNTER — Other Ambulatory Visit: Payer: Self-pay

## 2020-04-04 ENCOUNTER — Ambulatory Visit: Payer: Medicare PPO | Admitting: Obstetrics and Gynecology

## 2020-04-04 ENCOUNTER — Encounter: Payer: Self-pay | Admitting: Obstetrics and Gynecology

## 2020-04-04 VITALS — BP 138/76 | HR 75 | Temp 98.3°F | Ht 66.25 in | Wt 158.0 lb

## 2020-04-04 DIAGNOSIS — N849 Polyp of female genital tract, unspecified: Secondary | ICD-10-CM | POA: Diagnosis not present

## 2020-04-04 DIAGNOSIS — R935 Abnormal findings on diagnostic imaging of other abdominal regions, including retroperitoneum: Secondary | ICD-10-CM

## 2020-04-04 NOTE — Patient Instructions (Signed)
Hysteroscopy °Hysteroscopy is a procedure that is used to examine the inside of a woman's womb (uterus). This may be done for various reasons, including: °· To look for lumps (tumors) and other growths in the uterus. °· To evaluate abnormal bleeding, fibroid tumors, polyps, scar tissue (adhesions), or cancer of the uterus. °· To determine the cause of an inability to get pregnant (infertility) or repeated losses of pregnancies (miscarriages). °· To find a lost IUD (intrauterine device). °· To perform a procedure that permanently prevents pregnancy (sterilization). °During this procedure, a thin, flexible tube with a small light and camera (hysteroscope) is used to examine the uterus. The camera sends images to a monitor in the room so that your health care provider can view the inside of your uterus. A hysteroscopy should be done right after a menstrual period to make sure that you are not pregnant. °Tell a health care provider about: °· Any allergies you have. °· All medicines you are taking, including vitamins, herbs, eye drops, creams, and over-the-counter medicines. °· Any problems you or family members have had with the use of anesthetic medicines. °· Any blood disorders you have. °· Any surgeries you have had. °· Any medical conditions you have. °· Whether you are pregnant or may be pregnant. °What are the risks? °Generally, this is a safe procedure. However, problems may occur, including: °· Excessive bleeding. °· Infection. °· Damage to the uterus or other structures or organs. °· Allergic reaction to medicines or fluids that are used in the procedure. °What happens before the procedure? °Staying hydrated °Follow instructions from your health care provider about hydration, which may include: °· Up to 2 hours before the procedure - you may continue to drink clear liquids, such as water, clear fruit juice, black coffee, and plain tea. °Eating and drinking restrictions °Follow instructions from your health care  provider about eating and drinking, which may include: °· 8 hours before the procedure - stop eating solid foods and drink clear liquids only °· 2 hours before the procedure - stop drinking clear liquids. °General instructions °· Ask your health care provider about: °? Changing or stopping your normal medicines. This is important if you take diabetes medicines or blood thinners. °? Taking medicines such as aspirin and ibuprofen. These medicines can thin your blood and cause bleeding. Do not take these medicines for 1 week before your procedure, or as told by your health care provider. °· Do not use any products that contain nicotine or tobacco for 2 weeks before the procedure. This includes cigarettes and e-cigarettes. If you need help quitting, ask your health care provider. °· Medicine may be placed in your cervix the day before the procedure. This medicine causes the cervix to have a larger opening (dilate). The larger opening makes it easier for the hysteroscope to be inserted into the uterus during the procedure. °· Plan to have someone with you for the first 24-48 hours after the procedure, especially if you are given a medicine to make you fall asleep (general anesthetic). °· Plan to have someone take you home from the hospital or clinic. °What happens during the procedure? °· To lower your risk of infection: °? Your health care team will wash or sanitize their hands. °? Your skin will be washed with soap. °? Hair may be removed from the surgical area. °· An IV tube will be inserted into one of your veins. °· You may be given one or more of the following: °? A medicine to help   you relax (sedative). °? A medicine that numbs the area around the cervix (local anesthetic). °? A medicine to make you fall asleep (general anesthetic). °· A hysteroscope will be inserted through your vagina and into your uterus. °· Air or fluid will be used to enlarge your uterus, enabling your health care provider to see your uterus  better. The amount of fluid used will be carefully checked throughout the procedure. °· In some cases, tissue may be gently scraped from inside the uterus and sent to a lab for testing (biopsy). °The procedure may vary among health care providers and hospitals. °What happens after the procedure? °· Your blood pressure, heart rate, breathing rate, and blood oxygen level will be monitored until the medicines you were given have worn off. °· You may have some cramping. You may be given medicines for this. °· You may have bleeding, which varies from light spotting to menstrual-like bleeding. This is normal. °· If you had a biopsy done, it is your responsibility to get the results of your procedure. Ask your health care provider, or the department performing the procedure, when your results will be ready. °Summary °· Hysteroscopy is a procedure that is used to examine the inside of a woman's womb (uterus). °· After the procedure, you may have bleeding, which varies from light spotting to menstrual-like bleeding. This is normal. You may also have cramping. °· Plan to have someone take you home from the hospital or clinic. °This information is not intended to replace advice given to you by your health care provider. Make sure you discuss any questions you have with your health care provider. °Document Revised: 11/05/2017 Document Reviewed: 12/22/2016 °Elsevier Patient Education © 2020 [employer] ° °

## 2020-04-04 NOTE — Progress Notes (Signed)
69 y.o. G0P0000 Married White or Caucasian Not Hispanic or Latino female here for a consultation from Dr Hassan Rowan for a new finding of an endocervical vs endometrial polyp. The ultrasound was done for evaluation of pelvic pain and fullness.  She c/o a 6 month h/o vaginal fullness and pressure. The pain is ntermittent, some days uncomfortable, other days she doesn't notice. Not sure if there is a bulge, hasn't noticed one. No bowel or bladder c/o. She feels the pressure slightly ~3 days a week, ~1 x a week very uncomfortable when she goes to sit down.  Not sexually active secondary entry dyspareunia. Not helped enough with lubricant.   Results frin 03/20/20 ultrasound FINDINGS: Uterus  Measurements: 6.5 x 2.4 x 3.2 cm = volume: 26 mL. Anteverted and anteflexed. Ovoid area of abnormal echogenicity at lower uterine segment/cervix 2.0 x 1.3 x 1.4 cm, appears centered at the endocervical canal question endocervical polyp versus prolapsed endometrial polyp. Heterogeneous myometrium. No additional masses.  Endometrium  Thickness: 2 mm.  No endometrial fluid or focal abnormality  Right ovary  Not visualized, likely obscured by bowel  Left ovary  Not visualized, likely obscured by bowel  Other findings  No free pelvic fluid or adnexal masses.  IMPRESSION: Nonvisualization of ovaries.  2.0 x 1.3 x 1.4 cm diameter heterogeneous ovoid appearing mass identified at cervix extending to lower uterine segment, appears centered at the endocervical canal, question endocervical polyp versus prolapsed endometrial polyp; consider sonohysterogram for further evaluation versus hysteroscopy or endometrial biopsy      No LMP recorded. Patient is postmenopausal.          Sexually active: No.  The current method of family planning is post menopausal status.    Exercising: Yes.    Walking  Smoker:  no  Health Maintenance: Pap:  01/31/20 Normal  History of abnormal Pap:  no MMG:   1//01/11/20  Birads 1 neg  BMD:  12/21/18 osteoporosis  Colonoscopy: cologaurd 2018 TDaP:  09/2017 Gardasil: na    reports that she has never smoked. She has never used smokeless tobacco. She reports that she does not drink alcohol or use drugs. Retired  Past Medical History:  Diagnosis Date  . Essential hypertension   . History of dermatitis   . History of migraine headaches   . Hyperlipemia 05/24/2016  . Hypothyroidism   . MVP (mitral valve prolapse)    Reported history  . Palpitations    Rare PVCs by Holter monitor April 2011   . Sleep apnea    Reportedly mild  . Vitamin D insufficiency 05/24/2016  Sleep apnea improved with 30 lb weight loss.   Past Surgical History:  Procedure Laterality Date  . APPENDECTOMY    . BREAST LUMPECTOMY     Benign    Current Outpatient Medications  Medication Sig Dispense Refill  . albuterol (VENTOLIN HFA) 108 (90 Base) MCG/ACT inhaler Inhale 2 puffs into the lungs every 6 (six) hours as needed. 8.5 g 1  . Cholecalciferol (VITAMIN D3 PO) Take 1,000 Units by mouth daily.    . Cyanocobalamin (B-12) 1000 MCG TABS Take by mouth daily.    Marland Kitchen doxycycline (VIBRAMYCIN) 100 MG capsule Take 1 capsule (100 mg total) by mouth 2 (two) times daily. 20 capsule 0  . LORazepam (ATIVAN) 0.5 MG tablet Use 1/2 to 1 tablet as needed for panic attacks/anxiety 10 tablet 0  . metoprolol succinate (TOPROL-XL) 100 MG 24 hr tablet TAKE 1 TABLET BY MOUTH ONCE DAILY WITH A MEAL OR  IMMEDIATELY  FOLLOWING  A  MEAL 90 tablet 0  . omeprazole (PRILOSEC) 20 MG capsule Take 20 mg by mouth daily.    Marland Kitchen SYNTHROID 75 MCG tablet Take 1 tablet (75 mcg total) by mouth daily before breakfast. 90 tablet 1   No current facility-administered medications for this visit.    Family History  Problem Relation Age of Onset  . Melanoma Father 63  . Stroke Mother   . Asthma Mother   . Stroke Maternal Grandfather   . Asthma Brother   . Deep vein thrombosis Brother 69       had back surgery  with post op complications of blood clots which were fatal  . Thyroid disease Neg Hx     Review of Systems  Genitourinary: Positive for pelvic pain.  All other systems reviewed and are negative.   Exam:   BP 138/76   Pulse 75   Temp 98.3 F (36.8 C)   Ht 5' 6.25" (1.683 m)   Wt 158 lb (71.7 kg)   SpO2 98%   BMI 25.31 kg/m   Weight change: @WEIGHTCHANGE @ Height:   Height: 5' 6.25" (168.3 cm)  Ht Readings from Last 3 Encounters:  04/04/20 5' 6.25" (1.683 m)  03/13/20 5\' 8"  (1.727 m)  01/31/20 5\' 8"  (1.727 m)    General appearance: alert, cooperative and appears stated age Head: Normocephalic, without obvious abnormality, atraumatic Neck: no adenopathy, supple, symmetrical, trachea midline and thyroid normal to inspection and palpation Lungs: clear to auscultation bilaterally Cardiovascular: regular rate and rhythm Abdomen: soft, non-tender; non distended,  no masses,  no organomegaly Extremities: extremities normal, atraumatic, no cyanosis or edema Skin: Skin color, texture, turgor normal. No rashes or lesions Lymph nodes: Cervical, supraclavicular, and axillary nodes normal. No abnormal inguinal nodes palpated Neurologic: Grossly normal   Pelvic: External genitalia:  no lesions              Urethra:  normal appearing urethra with no masses, tenderness or lesions              Bartholins and Skenes: normal                 Vagina: atrophic appearing vagina with normal color and discharge, no lesions. No prolapse              Cervix: no lesions  She is uncomfortable even with use of a pediatric speculum.                Bimanual Exam:  Uterus:  normal size, contour, position, consistency, mobility, non-tender              Adnexa: no mass, fullness, tenderness   No pelvic floor tenderness.                 Gae Dry chaperoned for the exam.  A:  Abnormal ultrasound, endometrial vs endocervical polyp. No bleeding  C/o pelvic pain and fullness, normal exam, no prolapse,  not tender. I don't think the polyp is causing her pain.   P:   Discussed option of sonohysterogram or hysteroscopy. If the polyp is in her cervix it doesn't need to be removed if not symptomatic. If the polyp in in her uterine cavity the polyp should be removed irregardless to symptoms.   Plan: hysteroscopy, polypectomy, dilation and curettage. Reviewed risks, including: bleeding, infection, uterine perforation, fluid overload, need for further sugery  Will pretreat with cytotec

## 2020-04-08 ENCOUNTER — Telehealth: Payer: Self-pay | Admitting: Obstetrics and Gynecology

## 2020-04-08 MED ORDER — MISOPROSTOL 200 MCG PO TABS: ORAL_TABLET | ORAL | 0 refills | Status: DC

## 2020-04-08 NOTE — Telephone Encounter (Addendum)
Spoke with patient. Patient would like to proceed with surgery on 05/07/2020. Pre op scheduled for 04/15/2020 at 11 am with Dr.Jertson. Patient scheduled for COVID 19 test on 05/03/2020 at [facility] location. Patient is aware of the need to quarantine after test until surgery. Surgery scheduled for 05/07/2020 at 10:30 am. Patient is agreeable to dates and times. Surgery instructions given. Cytotec 200 mcg place 2 tablets vaginally the night before surgery sent to pharmacy on file. Patient verbalizes understanding of instructions. Patient verbalizes understanding and mailed to verified home address on file.   Routing to provider and will close encounter.

## 2020-04-08 NOTE — Telephone Encounter (Signed)
Spoke with patient regarding surgery benefits. Patient acknowledges understanding of information presented. Patient is aware that benefits presented are for professional benefits only. Patient is aware that once surgery is scheduled, the hospital will call with separate benefits. Patient is aware of surgery cancellation policy. ° °Patient is ready to proceed with scheduling. °

## 2020-04-12 ENCOUNTER — Other Ambulatory Visit: Payer: Self-pay

## 2020-04-12 ENCOUNTER — Telehealth: Payer: Self-pay

## 2020-04-12 MED ORDER — MISOPROSTOL 200 MCG PO TABS: ORAL_TABLET | ORAL | 0 refills | Status: DC

## 2020-04-12 NOTE — Telephone Encounter (Signed)
Spoke with Wandra Mannan at [facility] on file. Brand Cytotec and generic Cytotec are on backorder through the manufacturer  until the end of May. Patient is supposed to use Cytotec the night before surgery which is scheduled for 05/07/2020. Routing to Dr.Jertson for review.

## 2020-04-12 NOTE — Telephone Encounter (Signed)
Spoke with [facility] in [place]. They have Cytotec in stock. Spoke with patient who is agreeable to pick up rx from there. Rx for Cytotec 200 mcg place 2 tablets vaginally the night before surgery #2 0RF sent to pharmacy.  Routing to provider and will close encounter.

## 2020-04-12 NOTE — Telephone Encounter (Signed)
Can someone check if one of the other pharmacies has it? If we can't get it, we will proceed, but it would be preferable to have it.

## 2020-04-15 ENCOUNTER — Other Ambulatory Visit: Payer: Self-pay

## 2020-04-15 ENCOUNTER — Encounter: Payer: Self-pay | Admitting: Obstetrics and Gynecology

## 2020-04-15 ENCOUNTER — Ambulatory Visit (INDEPENDENT_AMBULATORY_CARE_PROVIDER_SITE_OTHER): Payer: Medicare PPO | Admitting: Obstetrics and Gynecology

## 2020-04-15 VITALS — BP 130/68 | HR 66 | Temp 97.0°F | Ht 67.0 in | Wt 148.0 lb

## 2020-04-15 DIAGNOSIS — R935 Abnormal findings on diagnostic imaging of other abdominal regions, including retroperitoneum: Secondary | ICD-10-CM

## 2020-04-15 DIAGNOSIS — N849 Polyp of female genital tract, unspecified: Secondary | ICD-10-CM

## 2020-04-15 NOTE — H&P (View-Only) (Signed)
GYNECOLOGY  VISIT   HPI: 69 y.o.   Married White or Caucasian Not Hispanic or Latino  female   G0P0000 with No LMP recorded. Patient is postmenopausal.   here for a preoperative visit. She had an ultrasound done for abdominal/pelvic pain and a large endometrial vs endocervical polyp was noted, no other abnormal findings. She has significant vaginal atrophy and discomfort even with the use of a pediatric speculum. We discussed the option of office sonohysterogram vs hysteroscopy and polypectomy in the OR, she desires hysteroscopy.   She reports normal BM's daily, no increase in gas.  H/O vaginal atrophy, hasn't been able to be sexually active.  GYNECOLOGIC HISTORY: No LMP recorded. Patient is postmenopausal. Contraception:PMP Menopausal hormone therapy: none        OB History    Gravida  0   Para  0   Term  0   Preterm  0   AB  0   Living  0     SAB  0   TAB  0   Ectopic  0   Multiple  0   Live Births  0              Patient Active Problem List   Diagnosis Date Noted  . Abnormal reflex 10/18/2019  . Numbness 10/18/2019  . B12 deficiency 10/18/2019  . Osteoporosis 01/04/2017  . Exercise-induced asthma 11/20/2016  . Upper airway cough syndrome 07/01/2016  . Dyspnea 07/01/2016  . Vitamin D insufficiency 05/24/2016  . Hyperlipemia 05/24/2016  . Palpitations 11/15/2014  . Meniere's disease 07/21/2007  . GASTROESOPHAGEAL REFLUX DISEASE, SEVERE 07/21/2007  . Hypothyroidism 07/08/2007  . Allergic rhinitis 07/08/2007    Past Medical History:  Diagnosis Date  . Essential hypertension   . History of dermatitis   . History of migraine headaches   . Hyperlipemia 05/24/2016  . Hypothyroidism   . MVP (mitral valve prolapse)    Reported history  . Palpitations    Rare PVCs by Holter monitor April 2011   . Sleep apnea    Reportedly mild  . Vitamin D insufficiency 05/24/2016    Past Surgical History:  Procedure Laterality Date  . APPENDECTOMY    . BREAST  LUMPECTOMY     Benign    Current Outpatient Medications  Medication Sig Dispense Refill  . albuterol (VENTOLIN HFA) 108 (90 Base) MCG/ACT inhaler Inhale 2 puffs into the lungs every 6 (six) hours as needed. 8.5 g 1  . Cholecalciferol (VITAMIN D3 PO) Take 1,000 Units by mouth daily.    . Cyanocobalamin (B-12) 1000 MCG TABS Take by mouth daily.    Marland Kitchen LORazepam (ATIVAN) 0.5 MG tablet Use 1/2 to 1 tablet as needed for panic attacks/anxiety 10 tablet 0  . metoprolol succinate (TOPROL-XL) 100 MG 24 hr tablet TAKE 1 TABLET BY MOUTH ONCE DAILY WITH A MEAL OR  IMMEDIATELY  FOLLOWING  A  MEAL 90 tablet 0  . omeprazole (PRILOSEC) 20 MG capsule Take 20 mg by mouth daily.    Marland Kitchen SYNTHROID 75 MCG tablet Take 1 tablet (75 mcg total) by mouth daily before breakfast. 90 tablet 1  . misoprostol (CYTOTEC) 200 MCG tablet Place 2 tablets vaginally the night before surgery. (Patient not taking: Reported on 04/15/2020) 2 tablet 0   No current facility-administered medications for this visit.     ALLERGIES: Tums [calcium carbonate antacid], Cephalexin, Penicillins, and Sulfamethoxazole-trimethoprim  Tum causes itching, the antibiotics cause hives.   Family History  Problem Relation Age of Onset  .  Melanoma Father 44  . Stroke Mother   . Asthma Mother   . Stroke Maternal Grandfather   . Asthma Brother   . Deep vein thrombosis Brother 65       had back surgery with post op complications of blood clots which were fatal  . Thyroid disease Neg Hx     Social History   Socioeconomic History  . Marital status: Married    Spouse name: Not on file  . Number of children: 0  . Years of education: 32  . Highest education level: Not on file  Occupational History  . 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 Smoking status: Never Smoker  . Smokeless tobacco: Never Used  Substance and Sexual Activity  . Alcohol use: No  . Drug use: No  . Sexual activity: Not Currently  Other Topics Concern   . Not on file  Social History Narrative   Work or School: retired - used to work for [employer] Situation: lives with husband      Spiritual Beliefs: Christian      Lifestyle: no regular exercise; diet is healthy      Right handed    No caffeine use:    Social Determinants of Radio broadcast assistant Strain:   . Difficulty of Paying Living Expenses:   Food Insecurity:   . Worried About Charity fundraiser in the Last Year:   . Arboriculturist in the Last Year:   Transportation Needs:   . Film/video editor (Medical):   Marland Kitchen Lack of Transportation (Non-Medical):   Physical Activity:   . Days of Exercise per Week:   . Minutes of Exercise per Session:   Stress:   . Feeling of Stress :   Social Connections:   . Frequency of Communication with Friends and Family:   . Frequency of Social Gatherings with Friends and Family:   . Attends Religious Services:   . Active Member of Clubs or Organizations:   . Attends Archivist Meetings:   Marland Kitchen Marital Status:   Intimate Partner Violence:   . Fear of Current or Ex-Partner:   . Emotionally Abused:   Marland Kitchen Physically Abused:   . Sexually Abused:     Review of Systems  Constitutional: Negative.   HENT: Negative.   Respiratory: Negative.   Cardiovascular: Negative.   Gastrointestinal: Negative.   Genitourinary: Negative.   Musculoskeletal: Negative.   Skin: Negative.   Neurological: Negative.   Endo/Heme/Allergies: Negative.   Psychiatric/Behavioral: Negative.     PHYSICAL EXAMINATION:    BP 130/68   Pulse 66   Temp (!) 97 F (36.1 C)   Ht 5\' 7"  (1.702 m)   Wt 148 lb (67.1 kg)   SpO2 100%   BMI 23.18 kg/m     General appearance: alert, cooperative and appears stated age Neck: no adenopathy, supple, symmetrical, trachea midline and thyroid normal to inspection and palpation Heart: regular rate and rhythm Lungs: CTAB Abdomen: soft, mildly tender in the LLQ, no rebound, no guarding,  not tender with palpation when she is tensing her muscles; no masses,  no organomegaly Extremities: normal, atraumatic, no cyanosis Skin: normal color, texture and turgor, no rashes or lesions Lymph: normal cervical supraclavicular and inguinal nodes Neurologic: grossly normal    ASSESSMENT Large endometrial vs endocervical polyp, no bleeding.  Vaginal atrophy, discussed the option of vaginal estrogen  PLAN Plan: hysteroscopy, polypectomy, dilation and curettage. Reviewed risks, including: bleeding, infection, uterine perforation, fluid overload, need for further sugery Will pre treat with cytotec At her post op visit will further discuss the option of vaginal estrogen and possibly vaginal dilators (I think she will need these if she wants to be sexually active)   CC: Dr [name]

## 2020-04-15 NOTE — Progress Notes (Signed)
GYNECOLOGY  VISIT   HPI: 69 y.o.   Married White or Caucasian Not Hispanic or Latino  female   G0P0000 with No LMP recorded. Patient is postmenopausal.   here for a preoperative visit. She had an ultrasound done for abdominal/pelvic pain and a large endometrial vs endocervical polyp was noted, no other abnormal findings. She has significant vaginal atrophy and discomfort even with the use of a pediatric speculum. We discussed the option of office sonohysterogram vs hysteroscopy and polypectomy in the OR, she desires hysteroscopy.   She reports normal BM's daily, no increase in gas.  H/O vaginal atrophy, hasn't been able to be sexually active.  GYNECOLOGIC HISTORY: No LMP recorded. Patient is postmenopausal. Contraception:PMP Menopausal hormone therapy: none        OB History    Gravida  0   Para  0   Term  0   Preterm  0   AB  0   Living  0     SAB  0   TAB  0   Ectopic  0   Multiple  0   Live Births  0              Patient Active Problem List   Diagnosis Date Noted  . Abnormal reflex 10/18/2019  . Numbness 10/18/2019  . B12 deficiency 10/18/2019  . Osteoporosis 01/04/2017  . Exercise-induced asthma 11/20/2016  . Upper airway cough syndrome 07/01/2016  . Dyspnea 07/01/2016  . Vitamin D insufficiency 05/24/2016  . Hyperlipemia 05/24/2016  . Palpitations 11/15/2014  . Meniere's disease 07/21/2007  . GASTROESOPHAGEAL REFLUX DISEASE, SEVERE 07/21/2007  . Hypothyroidism 07/08/2007  . Allergic rhinitis 07/08/2007    Past Medical History:  Diagnosis Date  . Essential hypertension   . History of dermatitis   . History of migraine headaches   . Hyperlipemia 05/24/2016  . Hypothyroidism   . MVP (mitral valve prolapse)    Reported history  . Palpitations    Rare PVCs by Holter monitor April 2011   . Sleep apnea    Reportedly mild  . Vitamin D insufficiency 05/24/2016    Past Surgical History:  Procedure Laterality Date  . APPENDECTOMY    . BREAST  LUMPECTOMY     Benign    Current Outpatient Medications  Medication Sig Dispense Refill  . albuterol (VENTOLIN HFA) 108 (90 Base) MCG/ACT inhaler Inhale 2 puffs into the lungs every 6 (six) hours as needed. 8.5 g 1  . Cholecalciferol (VITAMIN D3 PO) Take 1,000 Units by mouth daily.    . Cyanocobalamin (B-12) 1000 MCG TABS Take by mouth daily.    . LORazepam (ATIVAN) 0.5 MG tablet Use 1/2 to 1 tablet as needed for panic attacks/anxiety 10 tablet 0  . metoprolol succinate (TOPROL-XL) 100 MG 24 hr tablet TAKE 1 TABLET BY MOUTH ONCE DAILY WITH A MEAL OR  IMMEDIATELY  FOLLOWING  A  MEAL 90 tablet 0  . omeprazole (PRILOSEC) 20 MG capsule Take 20 mg by mouth daily.    . SYNTHROID 75 MCG tablet Take 1 tablet (75 mcg total) by mouth daily before breakfast. 90 tablet 1  . misoprostol (CYTOTEC) 200 MCG tablet Place 2 tablets vaginally the night before surgery. (Patient not taking: Reported on 04/15/2020) 2 tablet 0   No current facility-administered medications for this visit.     ALLERGIES: Tums [calcium carbonate antacid], Cephalexin, Penicillins, and Sulfamethoxazole-trimethoprim  Tum causes itching, the antibiotics cause hives.   Family History  Problem Relation Age of Onset  .   Melanoma Father 44  . Stroke Mother   . Asthma Mother   . Stroke Maternal Grandfather   . Asthma Brother   . Deep vein thrombosis Brother 65       had back surgery with post op complications of blood clots which were fatal  . Thyroid disease Neg Hx     Social History   Socioeconomic History  . Marital status: Married    Spouse name: Not on file  . Number of children: 0  . Years of education: 32  . Highest education level: Not on file  Occupational History  . 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 Smoking status: Never Smoker  . Smokeless tobacco: Never Used  Substance and Sexual Activity  . Alcohol use: No  . Drug use: No  . Sexual activity: Not Currently  Other Topics Concern   . Not on file  Social History Narrative   Work or School: retired - used to work for [employer] Situation: lives with husband      Spiritual Beliefs: Christian      Lifestyle: no regular exercise; diet is healthy      Right handed    No caffeine use:    Social Determinants of Radio broadcast assistant Strain:   . Difficulty of Paying Living Expenses:   Food Insecurity:   . Worried About Charity fundraiser in the Last Year:   . Arboriculturist in the Last Year:   Transportation Needs:   . Film/video editor (Medical):   Marland Kitchen Lack of Transportation (Non-Medical):   Physical Activity:   . Days of Exercise per Week:   . Minutes of Exercise per Session:   Stress:   . Feeling of Stress :   Social Connections:   . Frequency of Communication with Friends and Family:   . Frequency of Social Gatherings with Friends and Family:   . Attends Religious Services:   . Active Member of Clubs or Organizations:   . Attends Archivist Meetings:   Marland Kitchen Marital Status:   Intimate Partner Violence:   . Fear of Current or Ex-Partner:   . Emotionally Abused:   Marland Kitchen Physically Abused:   . Sexually Abused:     Review of Systems  Constitutional: Negative.   HENT: Negative.   Respiratory: Negative.   Cardiovascular: Negative.   Gastrointestinal: Negative.   Genitourinary: Negative.   Musculoskeletal: Negative.   Skin: Negative.   Neurological: Negative.   Endo/Heme/Allergies: Negative.   Psychiatric/Behavioral: Negative.     PHYSICAL EXAMINATION:    BP 130/68   Pulse 66   Temp (!) 97 F (36.1 C)   Ht 5\' 7"  (1.702 m)   Wt 148 lb (67.1 kg)   SpO2 100%   BMI 23.18 kg/m     General appearance: alert, cooperative and appears stated age Neck: no adenopathy, supple, symmetrical, trachea midline and thyroid normal to inspection and palpation Heart: regular rate and rhythm Lungs: CTAB Abdomen: soft, mildly tender in the LLQ, no rebound, no guarding,  not tender with palpation when she is tensing her muscles; no masses,  no organomegaly Extremities: normal, atraumatic, no cyanosis Skin: normal color, texture and turgor, no rashes or lesions Lymph: normal cervical supraclavicular and inguinal nodes Neurologic: grossly normal    ASSESSMENT Large endometrial vs endocervical polyp, no bleeding.  Vaginal atrophy, discussed the option of vaginal estrogen  PLAN Plan: hysteroscopy, polypectomy, dilation and curettage. Reviewed risks, including: bleeding, infection, uterine perforation, fluid overload, need for further sugery Will pre treat with cytotec At her post op visit will further discuss the option of vaginal estrogen and possibly vaginal dilators (I think she will need these if she wants to be sexually active)   CC: Dr [name]

## 2020-04-23 ENCOUNTER — Encounter: Payer: Medicare PPO | Admitting: Neurology

## 2020-04-23 ENCOUNTER — Ambulatory Visit (INDEPENDENT_AMBULATORY_CARE_PROVIDER_SITE_OTHER): Payer: Medicare PPO | Admitting: Neurology

## 2020-04-23 ENCOUNTER — Other Ambulatory Visit: Payer: Self-pay

## 2020-04-23 DIAGNOSIS — R200 Anesthesia of skin: Secondary | ICD-10-CM

## 2020-04-23 DIAGNOSIS — Z0289 Encounter for other administrative examinations: Secondary | ICD-10-CM

## 2020-04-23 NOTE — Progress Notes (Signed)
Full Name: Tracey Giles Gender: Female MRN #: [id number] Date of Birth: 1951/09/05    Visit Date: 04/23/2020 09:04 Age: 69 Years Examining Physician: Despina Arias, MD  Referring Physician: Despina Arias, MD Height: 5 feet 7 inch     History: [name] is a 69 year old woman with tingling in both legs.  The sensation can be uncomfortable but is usually not painful.  She had previously had MRI of the cervical spine and thoracic spine and no spinal cord pathology is noted.  On examination, strength and sensation was normal and symmetric.  The tandem gait is wide.  DTRs were 3 at the knees and ankles.  There was no clonus.  Nerve conduction studies: Left median,ulnar, peroneal and tibial motor responses had normal distal latencies, amplitudes and conduction velocities.  The tibial and ulnar F-wave latencies were normal.  Left median, ulnar or, sural and superficial peroneal sensory responses had normal peak latencies and amplitudes.   Galvanic skin response was present but blunted in the left Giles and normal in the left hand  Electromyography: Needle EMG of selected muscles of the left leg was performed.  Motor unit morphology and recruitment was normal.  There was no abnormal spontaneous activity.  Impression: This was a normal NCV/EMG study.  There is no evidence of polyneuropathy or significant radiculopathy.   Tracey Dame A. Epimenio Foot, MD, PhD, FAAN Certified in Neurology, Clinical Neurophysiology, Sleep Medicine, Pain Medicine and Neuroimaging Director, [facility]  [facility] [address] [place], [place] [address]  Verbal informed consent was obtained from the patient, patient was informed of potential risk of procedure, including bruising, bleeding, hematoma formation, infection, muscle weakness, muscle pain, numbness, among others.         MNC    Nerve / Sites Muscle Latency Ref.  Amplitude Ref. Rel Amp Segments Distance Velocity Ref. Area    ms ms mV mV %  cm m/s m/s mVms  L Median - APB     Wrist APB 3.1 ?4.4 8.4 ?4.0 100 Wrist - APB 7   34.5     Upper arm APB 7.5  8.0  95.5 Upper arm - Wrist 23 53 ?49 33.0  L Ulnar - ADM     Wrist ADM 2.8 ?3.3 12.8 ?6.0 100 Wrist - ADM 7   38.0     B.Elbow ADM 5.9  11.9  93 B.Elbow - Wrist 19 60 ?49 36.8     A.Elbow ADM 7.6  11.9  100 A.Elbow - B.Elbow 10 60 ?49 37.3         A.Elbow - Wrist      L Peroneal - EDB     Ankle EDB 4.8 ?6.5 4.2 ?2.0 100 Ankle - EDB 9   12.7     Fib head EDB 10.9  3.8  90 Fib head - Ankle 27 45 ?44 12.1     Pop fossa EDB 13.1  3.6  95.3 Pop fossa - Fib head 10 45 ?44 12.2         Pop fossa - Ankle      L Tibial - AH     Ankle AH 4.8 ?5.8 20.1 ?4.0 100 Ankle - AH 9   50.4     Pop fossa AH 12.7  13.1  65.2 Pop fossa - Ankle 36 45 ?41 39.9             SSR    Nerve /  Sites Latency   s  L Sympathetic - Palm     Palm 1.58  L Sympathetic - Giles     Giles 2.79            SNC    Nerve / Sites Rec. Site Peak Lat Ref.  Amp Ref. Segments Distance    ms ms V V  cm  L Sural - Ankle (Calf)     Calf Ankle 4.2 ?4.4 17 ?6 Calf - Ankle 14  L Superficial peroneal - Ankle     Lat leg Ankle 4.2 ?4.4 8 ?6 Lat leg - Ankle 14  L Median - Orthodromic (Dig II, Mid palm)     Dig II Wrist 3.0 ?3.4 16 ?10 Dig II - Wrist 13  L Ulnar - Orthodromic, (Dig V, Mid palm)     Dig V Wrist 3.0 ?3.1 9 ?5 Dig V - Wrist 55             F  Wave    Nerve F Lat Ref.   ms ms  L Tibial - AH 52.9 ?56.0  L Ulnar - ADM 28.8 ?32.0         EMG Summary Table    Spontaneous MUAP Recruitment  Muscle IA Fib PSW Fasc Other Amp Dur. Poly Pattern  L. Vastus medialis Normal None None None _______ Normal Normal 1+ Normal  L. Peroneus longus Normal None None None _______ Normal Normal Normal Normal  L. Tibialis anterior Normal None None None _______ Normal Normal Normal Normal  L. Gastrocnemius (Medial head) Normal None None None  _______ Normal Normal 1+ Normal  L. Extensor digitorum brevis Normal None None None _______ Normal Normal Normal Normal  L. Gluteus medius Normal None None None _______ Normal Normal 1+ Normal  L. Iliopsoas Normal None None None _______ Normal Normal Normal Normal        [phone]

## 2020-04-29 ENCOUNTER — Encounter (HOSPITAL_BASED_OUTPATIENT_CLINIC_OR_DEPARTMENT_OTHER): Payer: Self-pay | Admitting: Obstetrics and Gynecology

## 2020-04-29 ENCOUNTER — Other Ambulatory Visit: Payer: Self-pay

## 2020-04-29 NOTE — Progress Notes (Signed)
Spoke w/ via phone for pre-op interview---patient Lab needs dos----I stat 8, ekg              COVID test ------[id number]  am Arrive at -------900 am 2-21 NPO after ------midnight Medications to take morning of surgery -----lorazepam prn, albuterol inhaler prn/bring inhaler, metorpolol succinate, synthroid, omeprazole Diabetic medication -----n/a Patient Special Instructions -----none Pre-Op special Istructions -----none Patient verbalized understanding of instructions that were given at this phone interview. Patient denies shortness of breath, chest pain, fever, cough a this phone interview.  Anesthesia :  LKG:MWNUUV kim Cardiologist :dr Rozetta Nunnery 01-04-2019 sees prn for mvp Chest x-ray: none EKG :none Echo :01-06-2019 epic lov neurology dr Epimenio Foot 04-23-2020 epic Cardiac Cath : none Sleep Study/ CPAP :mild osa per sleep study no cpap needed Fasting Blood Sugar :      / Checks Blood Sugar -- times a day:  n/a Blood Thinner/ Instructions /Last Dose:n/a ASA / Instructions/ Last Dose : n/a  Patient denies shortness of breath, chest pain, fever, and cough at this phone interview.

## 2020-05-03 ENCOUNTER — Other Ambulatory Visit (HOSPITAL_COMMUNITY)
Admission: RE | Admit: 2020-05-03 | Discharge: 2020-05-03 | Disposition: A | Discharge status: Home / Self Care | Payer: Medicare PPO | Source: Ambulatory Visit | Attending: Obstetrics and Gynecology | Admitting: Obstetrics and Gynecology

## 2020-05-03 DIAGNOSIS — Z01812 Encounter for preprocedural laboratory examination: Secondary | ICD-10-CM | POA: Diagnosis not present

## 2020-05-03 DIAGNOSIS — Z20822 Contact with and (suspected) exposure to covid-19: Secondary | ICD-10-CM | POA: Diagnosis not present

## 2020-05-03 LAB — SARS CORONAVIRUS 2 (TAT 6-24 HRS): SARS Coronavirus 2: NEGATIVE

## 2020-05-07 ENCOUNTER — Ambulatory Visit (HOSPITAL_BASED_OUTPATIENT_CLINIC_OR_DEPARTMENT_OTHER)
Admission: RE | Admit: 2020-05-07 | Discharge: 2020-05-07 | Disposition: A | Discharge status: Home / Self Care | Payer: Medicare PPO | Attending: Obstetrics and Gynecology | Admitting: Obstetrics and Gynecology

## 2020-05-07 ENCOUNTER — Encounter (HOSPITAL_BASED_OUTPATIENT_CLINIC_OR_DEPARTMENT_OTHER)
Admission: RE | Disposition: A | Discharge status: Home / Self Care | Payer: Self-pay | Source: Home / Self Care | Attending: Obstetrics and Gynecology

## 2020-05-07 ENCOUNTER — Encounter (HOSPITAL_BASED_OUTPATIENT_CLINIC_OR_DEPARTMENT_OTHER): Payer: Self-pay | Admitting: Obstetrics and Gynecology

## 2020-05-07 ENCOUNTER — Ambulatory Visit (HOSPITAL_BASED_OUTPATIENT_CLINIC_OR_DEPARTMENT_OTHER): Payer: Medicare PPO | Admitting: Certified Registered"

## 2020-05-07 DIAGNOSIS — E039 Hypothyroidism, unspecified: Secondary | ICD-10-CM | POA: Diagnosis not present

## 2020-05-07 DIAGNOSIS — Z880 Allergy status to penicillin: Secondary | ICD-10-CM | POA: Insufficient documentation

## 2020-05-07 DIAGNOSIS — N848 Polyp of other parts of female genital tract: Secondary | ICD-10-CM | POA: Diagnosis not present

## 2020-05-07 DIAGNOSIS — J45990 Exercise induced bronchospasm: Secondary | ICD-10-CM | POA: Insufficient documentation

## 2020-05-07 DIAGNOSIS — I10 Essential (primary) hypertension: Secondary | ICD-10-CM | POA: Diagnosis not present

## 2020-05-07 DIAGNOSIS — K219 Gastro-esophageal reflux disease without esophagitis: Secondary | ICD-10-CM | POA: Diagnosis not present

## 2020-05-07 DIAGNOSIS — N858 Other specified noninflammatory disorders of uterus: Secondary | ICD-10-CM | POA: Insufficient documentation

## 2020-05-07 DIAGNOSIS — G4730 Sleep apnea, unspecified: Secondary | ICD-10-CM | POA: Insufficient documentation

## 2020-05-07 DIAGNOSIS — Z79899 Other long term (current) drug therapy: Secondary | ICD-10-CM | POA: Diagnosis not present

## 2020-05-07 DIAGNOSIS — Z881 Allergy status to other antibiotic agents status: Secondary | ICD-10-CM | POA: Diagnosis not present

## 2020-05-07 DIAGNOSIS — N840 Polyp of corpus uteri: Secondary | ICD-10-CM | POA: Diagnosis not present

## 2020-05-07 DIAGNOSIS — N841 Polyp of cervix uteri: Secondary | ICD-10-CM | POA: Insufficient documentation

## 2020-05-07 DIAGNOSIS — E559 Vitamin D deficiency, unspecified: Secondary | ICD-10-CM | POA: Insufficient documentation

## 2020-05-07 DIAGNOSIS — Z79890 Hormone replacement therapy (postmenopausal): Secondary | ICD-10-CM | POA: Insufficient documentation

## 2020-05-07 DIAGNOSIS — F419 Anxiety disorder, unspecified: Secondary | ICD-10-CM | POA: Diagnosis not present

## 2020-05-07 DIAGNOSIS — N888 Other specified noninflammatory disorders of cervix uteri: Secondary | ICD-10-CM | POA: Diagnosis not present

## 2020-05-07 HISTORY — DX: Anxiety disorder, unspecified: F41.9

## 2020-05-07 HISTORY — DX: Paresthesia of skin: R20.2

## 2020-05-07 HISTORY — PX: DILATATION & CURETTAGE/HYSTEROSCOPY WITH MYOSURE: SHX6511

## 2020-05-07 HISTORY — DX: Meniere's disease, unspecified ear: H81.09

## 2020-05-07 HISTORY — DX: Cardiac murmur, unspecified: R01.1

## 2020-05-07 HISTORY — DX: Unspecified asthma, uncomplicated: J45.909

## 2020-05-07 HISTORY — DX: Gastro-esophageal reflux disease without esophagitis: K21.9

## 2020-05-07 LAB — POCT I-STAT, CHEM 8
BUN: 12 mg/dL (ref 8–23)
Calcium, Ion: 1.26 mmol/L (ref 1.15–1.40)
Chloride: 105 mmol/L (ref 98–111)
Creatinine, Ser: 0.8 mg/dL (ref 0.44–1.00)
Glucose, Bld: 103 mg/dL — ABNORMAL HIGH (ref 70–99)
HCT: 44 % (ref 36.0–46.0)
Hemoglobin: 15 g/dL (ref 12.0–15.0)
Potassium: 5.2 mmol/L — ABNORMAL HIGH (ref 3.5–5.1)
Sodium: 142 mmol/L (ref 135–145)
TCO2: 27 mmol/L (ref 22–32)

## 2020-05-07 SURGERY — DILATATION & CURETTAGE/HYSTEROSCOPY WITH MYOSURE
Anesthesia: General | Site: Vagina

## 2020-05-07 MED ORDER — DEXAMETHASONE SODIUM PHOSPHATE 10 MG/ML IJ SOLN
INTRAMUSCULAR | Status: AC
  Filled 2020-05-07: qty 1

## 2020-05-07 MED ORDER — ONDANSETRON HCL 4 MG/2ML IJ SOLN
INTRAMUSCULAR | Status: AC
  Filled 2020-05-07: qty 2

## 2020-05-07 MED ORDER — PROPOFOL 10 MG/ML IV BOLUS
INTRAVENOUS | Status: DC | PRN
  Administered 2020-05-07: 160 mg via INTRAVENOUS

## 2020-05-07 MED ORDER — OXYCODONE HCL 5 MG PO TABS: 5.0000 mg | ORAL_TABLET | Freq: Once | ORAL | Status: DC | PRN

## 2020-05-07 MED ORDER — ACETAMINOPHEN 500 MG PO TABS
ORAL_TABLET | ORAL | Status: AC
  Filled 2020-05-07: qty 2

## 2020-05-07 MED ORDER — OXYCODONE HCL 5 MG/5ML PO SOLN: 5.0000 mg | Freq: Once | ORAL | Status: DC | PRN

## 2020-05-07 MED ORDER — ONDANSETRON HCL 4 MG/2ML IJ SOLN
INTRAMUSCULAR | Status: DC | PRN
  Administered 2020-05-07: 4 mg via INTRAVENOUS

## 2020-05-07 MED ORDER — SODIUM CHLORIDE 0.9 % IR SOLN
Status: DC | PRN
  Administered 2020-05-07: 3000 mL

## 2020-05-07 MED ORDER — FENTANYL CITRATE (PF) 100 MCG/2ML IJ SOLN
INTRAMUSCULAR | Status: AC
  Filled 2020-05-07: qty 2

## 2020-05-07 MED ORDER — ACETAMINOPHEN 500 MG PO TABS
1000.0000 mg | ORAL_TABLET | ORAL | Status: AC
  Administered 2020-05-07: 1000 mg via ORAL

## 2020-05-07 MED ORDER — MIDAZOLAM HCL 5 MG/5ML IJ SOLN
INTRAMUSCULAR | Status: DC | PRN
  Administered 2020-05-07: 2 mg via INTRAVENOUS

## 2020-05-07 MED ORDER — LIDOCAINE 2% (20 MG/ML) 5 ML SYRINGE
INTRAMUSCULAR | Status: AC
  Filled 2020-05-07: qty 5

## 2020-05-07 MED ORDER — MIDAZOLAM HCL 2 MG/2ML IJ SOLN
INTRAMUSCULAR | Status: AC
  Filled 2020-05-07: qty 2

## 2020-05-07 MED ORDER — SODIUM CHLORIDE 0.9 % IV SOLN: INTRAVENOUS | Status: DC

## 2020-05-07 MED ORDER — CHLORHEXIDINE GLUCONATE 0.12 % MT SOLN: 15.0000 mL | Freq: Once | OROMUCOSAL | Status: DC

## 2020-05-07 MED ORDER — SODIUM CHLORIDE 0.45 % IV SOLN: INTRAVENOUS | Status: DC

## 2020-05-07 MED ORDER — ORAL CARE MOUTH RINSE: 15.0000 mL | Freq: Once | OROMUCOSAL | Status: DC

## 2020-05-07 MED ORDER — ONDANSETRON HCL 4 MG/2ML IJ SOLN: 4.0000 mg | Freq: Once | INTRAMUSCULAR | Status: DC | PRN

## 2020-05-07 MED ORDER — LIDOCAINE 2% (20 MG/ML) 5 ML SYRINGE
INTRAMUSCULAR | Status: DC | PRN
  Administered 2020-05-07: 80 mg via INTRAVENOUS

## 2020-05-07 MED ORDER — PROPOFOL 10 MG/ML IV BOLUS
INTRAVENOUS | Status: AC
  Filled 2020-05-07: qty 20

## 2020-05-07 MED ORDER — FENTANYL CITRATE (PF) 100 MCG/2ML IJ SOLN: 25.0000 ug | INTRAMUSCULAR | Status: DC | PRN

## 2020-05-07 MED ORDER — FENTANYL CITRATE (PF) 100 MCG/2ML IJ SOLN
INTRAMUSCULAR | Status: DC | PRN
  Administered 2020-05-07 (×2): 25 ug via INTRAVENOUS
  Administered 2020-05-07: 50 ug via INTRAVENOUS

## 2020-05-07 MED ORDER — DEXAMETHASONE SODIUM PHOSPHATE 10 MG/ML IJ SOLN
INTRAMUSCULAR | Status: DC | PRN
  Administered 2020-05-07: 10 mg via INTRAVENOUS

## 2020-05-07 SURGICAL SUPPLY — 21 items
CATH ROBINSON RED A/P 16FR (CATHETERS) ×1 IMPLANT
COVER WAND RF STERILE (DRAPES) ×2 IMPLANT
DEVICE MYOSURE LITE (MISCELLANEOUS) IMPLANT
DEVICE MYOSURE REACH (MISCELLANEOUS) ×1 IMPLANT
DILATOR CANAL MILEX (MISCELLANEOUS) IMPLANT
GAUZE 4X4 16PLY RFD (DISPOSABLE) ×2 IMPLANT
GLOVE BIO SURGEON STRL SZ 6.5 (GLOVE) ×2 IMPLANT
GOWN STRL REUS W/TWL LRG LVL3 (GOWN DISPOSABLE) ×2 IMPLANT
IV NS IRRIG 3000ML ARTHROMATIC (IV SOLUTION) ×2 IMPLANT
KIT PROCEDURE FLUENT (KITS) ×2 IMPLANT
KIT TURNOVER CYSTO (KITS) ×2 IMPLANT
MYOSURE XL FIBROID (MISCELLANEOUS)
PACK VAGINAL MINOR WOMEN LF (CUSTOM PROCEDURE TRAY) ×2 IMPLANT
PAD OB MATERNITY 4.3X12.25 (PERSONAL CARE ITEMS) ×2 IMPLANT
PAD PREP 24X48 CUFFED NSTRL (MISCELLANEOUS) ×2 IMPLANT
SEAL CERVICAL OMNI LOK (ABLATOR) IMPLANT
SEAL ROD LENS SCOPE MYOSURE (ABLATOR) ×2 IMPLANT
SYR 20ML LL LF (SYRINGE) IMPLANT
SYSTEM TISS REMOVAL MYOSURE XL (MISCELLANEOUS) IMPLANT
TOWEL OR 17X26 10 PK STRL BLUE (TOWEL DISPOSABLE) ×2 IMPLANT
WATER STERILE IRR 500ML POUR (IV SOLUTION) ×1 IMPLANT

## 2020-05-07 NOTE — Op Note (Signed)
Preoperative Diagnosis: Endometrial vs endocervical polyp  Postoperative Diagnosis: Endometrial and endocervical polyps  Procedure: Hysteroscopy, polypectomy, dilation and curettage  Surgeon: Dr Gertie Exon  Assistants: None  Anesthesia: General via LMA  EBL: minimal  Fluids: 750 cc  Fluid deficit: 135 cc   Urine output: not recorded  Indications for surgery: The patient is a 69 yo female, who presented with an incidental finding of a large endometrial vs endocervical polyp. The ultrasound was done for pain, no bleeding. We discussed the option of office sonohysterogram or hysteroscopy in the OR. She preferred hysteroscopy.  The risks of the surgery were reviewed with the patient and the consent form was signed prior to her surgery.  Findings: Normal sized uterus, endometrial and endocervical polyps, thin endometrium, normal tubal ostia bilaterally  Specimens: Endometrial and endocervical polyps, endocervical curettage, endometrial curettage.    Procedure: The patient was taken to the operating room with an IV in place. She was placed in the dorsal lithotomy position and anesthesia was administered. She was prepped and draped in the usual sterile fashion for a vaginal procedure. She voided on the way to the OR. A weighted speculum was placed in the vagina and a single tooth tenaculum was placed on the anterior lip of the cervix. The cervix was dilated to a #6 hagar dilator. The uterus was sounded to 6 cm. The myosure hysteroscope was inserted into the uterine cavity. With continuous infusion of normal saline, the uterine cavity was visualized with the above findings. The myosure reach was used to resect the polyps. The myosure was then removed. An endocervical curettage was obtained. The endometrial cavity was then curetted with the small sharp curette. The cavity had the characteristically gritty texture at the end of the procedure. The curette and the single tooth tenaculum were  removed. The speculum was removed. The patients perineum was cleansed of betadine and she was taken out of the dorsal lithotomy position.  Upon awakening the LMA was removed and the patient was transferred to the recovery room in stable and awake condition.  The sponge and instrument count were correct. There were no complications.    CC: [name], MD

## 2020-05-07 NOTE — Anesthesia Procedure Notes (Signed)
Procedure Name: LMA Insertion Date/Time: 05/07/2020 11:33 AM Performed by: Kemal Amores D, CRNA Pre-anesthesia Checklist: Patient identified, Emergency Drugs available, Suction available and Patient being monitored Patient Re-evaluated:Patient Re-evaluated prior to induction Oxygen Delivery Method: Circle system utilized Preoxygenation: Pre-oxygenation with 100% oxygen Induction Type: IV induction Ventilation: Mask ventilation without difficulty LMA: LMA inserted LMA Size: 4.0 Tube type: Oral Number of attempts: 1 Placement Confirmation: positive ETCO2 and breath sounds checked- equal and bilateral Tube secured with: Tape Dental Injury: Teeth and Oropharynx as per pre-operative assessment

## 2020-05-07 NOTE — Transfer of Care (Signed)
Immediate Anesthesia Transfer of Care Note  Patient: Tracey Giles  Procedure(s) Performed: DILATATION & CURETTAGE/HYSTEROSCOPY WITH MYOSURE/POLYP REMOVAL (N/A Vagina )  Patient Location: PACU  Anesthesia Type:General  Level of Consciousness: awake, alert  and oriented  Airway & Oxygen Therapy: Patient Spontanous Breathing and Patient connected to nasal cannula oxygen  Post-op Assessment: Report given to RN and Post -op Vital signs reviewed and stable  Post vital signs: Reviewed and stable  Last Vitals:  Vitals Value Taken Time  BP 155/66 05/07/20 1211  Temp 37 C 05/07/20 1211  Pulse 79 05/07/20 1212  Resp 12 05/07/20 1212  SpO2 100 % 05/07/20 1212  Vitals shown include unvalidated device data.  Last Pain:  Vitals:   05/07/20 0947  TempSrc: Oral  PainSc: 0-No pain      Patients Stated Pain Goal: 4 (05/07/20 0947)  Complications: No apparent anesthesia complications

## 2020-05-07 NOTE — Discharge Instructions (Signed)
  Post Anesthesia Home Care Instructions  Activity: Get plenty of rest for the remainder of the day. A responsible individual must stay with you for 24 hours following the procedure.  For the next 24 hours, DO NOT: -Drive a car -Advertising copywriter -Drink alcoholic beverages -Take any medication unless instructed by your physician -Make any legal decisions or sign important papers.  Meals: Start with liquid foods such as gelatin or soup. Progress to regular foods as tolerated. Avoid greasy, spicy, heavy foods. If nausea and/or vomiting occur, drink only clear liquids until the nausea and/or vomiting subsides. Call your physician if vomiting continues.  Special Instructions/Symptoms: Your throat may feel dry or sore from the anesthesia or the breathing tube placed in your throat during surgery. If this causes discomfort, gargle with warm salt water. The discomfort should disappear within 24 hours.      D & C Home care Instructions:   Personal hygiene:  Used sanitary napkins for vaginal drainage not tampons. Shower or tub bathe the day after your procedure. No douching until bleeding stops. Always wipe from front to back after  Elimination.  Activity: Do not drive or operate any equipment today. The effects of the anesthesia are still present and drowsiness may result. Rest today, not necessarily flat bed rest, just take it easy. You may resume your normal activity in one to 2 days.  Sexual activity: No intercourse for one week or as indicated by your physician  Diet: Eat a light diet as desired this evening. You may resume a regular diet tomorrow.  Return to work: One to 2 days.  General Expectations of your surgery: Vaginal bleeding should be no heavier than a normal period. Spotting may continue up to 10 days. Mild cramps may continue for a couple of days. You may have a regular period in 2-6 weeks.  Unexpected observations call your doctor if these occur: persistent or heavy  bleeding. Severe abdominal cramping or pain. Elevation of temperature greater than 100F.

## 2020-05-07 NOTE — Anesthesia Postprocedure Evaluation (Signed)
Anesthesia Post Note  Patient: Tracey Giles [facility]  Procedure(s) Performed: DILATATION & CURETTAGE/HYSTEROSCOPY WITH MYOSURE/POLYP REMOVAL (N/A Vagina )     Patient location during evaluation: PACU Anesthesia Type: General Level of consciousness: awake and alert Pain management: pain level controlled Vital Signs Assessment: post-procedure vital signs reviewed and stable Respiratory status: spontaneous breathing, nonlabored ventilation and respiratory function stable Cardiovascular status: blood pressure returned to baseline and stable Postop Assessment: no apparent nausea or vomiting Anesthetic complications: no    Last Vitals:  Vitals:   05/07/20 1300 05/07/20 1330  BP: (!) 171/73 (!) 151/64  Pulse: 65 63  Resp: 10   Temp:    SpO2: 98% 96%    Last Pain:  Vitals:   05/07/20 1257  TempSrc:   PainSc: 0-No pain                 Lucretia Kern

## 2020-05-07 NOTE — Interval H&P Note (Signed)
History and Physical Interval Note:  05/07/2020 11:04 AM  Tracey Giles  has presented today for surgery, with the diagnosis of Polyp of female genital tract.  The various methods of treatment have been discussed with the patient and family. After consideration of risks, benefits and other options for treatment, the patient has consented to  Procedure(s) with comments: DILATATION & CURETTAGE/HYSTEROSCOPY WITH MYOSURE/POLYP REMOVAL (N/A) - polyp removal as a surgical intervention.  The patient's history has been reviewed, patient examined, no change in status, stable for surgery.  I have reviewed the patient's chart and labs.  Questions were answered to the patient's satisfaction.     Romualdo Bolk

## 2020-05-07 NOTE — Anesthesia Preprocedure Evaluation (Signed)
Anesthesia Evaluation  Patient identified by MRN, date of birth, ID band Patient awake    Reviewed: Allergy & Precautions, NPO status , Patient's Chart, lab work & pertinent test results, reviewed documented beta blocker date and time   History of Anesthesia Complications Negative for: history of anesthetic complications  Airway Mallampati: III  TM Distance: >3 FB Neck ROM: Full    Dental  (+) Teeth Intact   Pulmonary asthma , sleep apnea ,    Pulmonary exam normal        Cardiovascular hypertension, Pt. on medications and Pt. on home beta blockers Normal cardiovascular exam     Neuro/Psych Anxiety negative neurological ROS     GI/Hepatic Neg liver ROS, GERD  ,  Endo/Other  Hypothyroidism   Renal/GU negative Renal ROS  negative genitourinary   Musculoskeletal negative musculoskeletal ROS (+)   Abdominal   Peds  Hematology negative hematology ROS (+)   Anesthesia Other Findings  Echo 01/06/19: EF >65%, normal valves  Exercise test 2017: Exercise testing with gas exchange demonstrates a mild-to-moderate functional impairment when compared to matched sedentary norms. Patient appears to likely have mild circulatory limitation indicated with low VO2WorkSLope and low-flat O2 pulse (suggesting possible ischemia, however ECG within normal limits). However, at peak exercise, patient appears primarily limited due to exercise-induced bronchospasm with mild-moderate reduction in FEV1 for up to 15 minutes post-exercise.   Reproductive/Obstetrics                          Anesthesia Physical Anesthesia Plan  ASA: II  Anesthesia Plan: General   Post-op Pain Management:    Induction: Intravenous  PONV Risk Score and Plan: 4 or greater and Ondansetron, Dexamethasone, Midazolam and Treatment may vary due to age or medical condition  Airway Management Planned: LMA  Additional Equipment: None  Intra-op  Plan:   Post-operative Plan: Extubation in OR  Informed Consent: I have reviewed the patients History and Physical, chart, labs and discussed the procedure including the risks, benefits and alternatives for the proposed anesthesia with the patient or authorized representative who has indicated his/her understanding and acceptance.     Dental advisory given  Plan Discussed with:   Anesthesia Plan Comments:         Anesthesia Quick Evaluation

## 2020-05-09 LAB — SURGICAL PATHOLOGY

## 2020-05-20 ENCOUNTER — Other Ambulatory Visit: Payer: Self-pay | Admitting: Family Medicine

## 2020-05-21 ENCOUNTER — Other Ambulatory Visit: Payer: Self-pay

## 2020-05-22 ENCOUNTER — Telehealth: Payer: Self-pay

## 2020-05-22 ENCOUNTER — Encounter: Payer: Self-pay | Admitting: Obstetrics and Gynecology

## 2020-05-22 ENCOUNTER — Ambulatory Visit: Payer: Medicare PPO | Admitting: Obstetrics and Gynecology

## 2020-05-22 VITALS — BP 122/64 | HR 68 | Temp 98.2°F | Wt 146.0 lb

## 2020-05-22 DIAGNOSIS — N952 Postmenopausal atrophic vaginitis: Secondary | ICD-10-CM

## 2020-05-22 DIAGNOSIS — Z9889 Other specified postprocedural states: Secondary | ICD-10-CM | POA: Diagnosis not present

## 2020-05-22 MED ORDER — ESTRADIOL 10 MCG VA TABS: ORAL_TABLET | VAGINAL | 0 refills | Status: DC

## 2020-05-22 NOTE — Patient Instructions (Signed)
Atrophic Vaginitis  Atrophic vaginitis is a condition in which the tissues that line the vagina become dry and thin. This condition is most common in women who have stopped having regular menstrual periods (are in menopause). This usually starts when a woman is 45-69 years old. That is the time when a woman's estrogen levels begin to drop (decrease). Estrogen is a female hormone. It helps to keep the tissues of the vagina moist. It stimulates the vagina to produce a clear fluid that lubricates the vagina for sexual intercourse. This fluid also protects the vagina from infection. Lack of estrogen can cause the lining of the vagina to get thinner and dryer. The vagina may also shrink in size. It may become less elastic. Atrophic vaginitis tends to get worse over time as a woman's estrogen level drops. What are the causes? This condition is caused by the normal drop in estrogen that happens around the time of menopause. What increases the risk? Certain conditions or situations may lower a woman's estrogen level, leading to a higher risk for atrophic vaginitis. You are more likely to develop this condition if:  You are taking medicines that block estrogen.  You have had your ovaries removed.  You are being treated for cancer with X-ray (radiation) or medicines (chemotherapy).  You have given birth or are breastfeeding.  You are older than age 50.  You smoke. What are the signs or symptoms? Symptoms of this condition include:  Pain, soreness, or bleeding during sexual intercourse (dyspareunia).  Vaginal burning, irritation, or itching.  Pain or bleeding when a speculum is used in a vaginal exam (pelvic exam).  Having burning pain when passing urine.  Vaginal discharge that is brown or yellow. In some cases, there are no symptoms. How is this diagnosed? This condition is diagnosed by taking a medical history and doing a physical exam. This will include a pelvic exam that checks the  vaginal tissues. Though rare, you may also have other tests, including:  A urine test.  A test that checks the acid balance in your vagina (acid balance test). How is this treated? Treatment for this condition depends on how severe your symptoms are. Treatment may include:  Using an over-the-counter vaginal lubricant before sex.  Using a long-acting vaginal moisturizer.  Using low-dose vaginal estrogen for moderate to severe symptoms that do not respond to other treatments. Options include creams, tablets, and inserts (vaginal rings). Before you use a vaginal estrogen, tell your health care provider if you have a history of: ? Breast cancer. ? Endometrial cancer. ? Blood clots. If you are not sexually active and your symptoms are very mild, you may not need treatment. Follow these instructions at home: Medicines  Take over-the-counter and prescription medicines only as told by your health care provider. Do not use herbal or alternative medicines unless your health care provider says that you can.  Use over-the-counter creams, lubricants, or moisturizers for dryness only as directed by your health care provider. General instructions  If your atrophic vaginitis is caused by menopause, discuss all of your menopause symptoms and treatment options with your health care provider.  Do not douche.  Do not use products that can make your vagina dry. These include: ? Scented feminine sprays. ? Scented tampons. ? Scented soaps.  Vaginal intercourse can help to improve blood flow and elasticity of vaginal tissue. If it hurts to have sex, try using a lubricant or moisturizer just before having intercourse. Contact a health care provider if:    Your discharge looks different than normal.  Your vagina has an unusual smell.  You have new symptoms.  Your symptoms do not improve with treatment.  Your symptoms get worse. Summary  Atrophic vaginitis is a condition in which the tissues that  line the vagina become dry and thin. It is most common in women who have stopped having regular menstrual periods (are in menopause).  Treatment options include using vaginal lubricants and low-dose vaginal estrogen.  Contact a health care provider if your vagina has an unusual smell, or if your symptoms get worse or do not improve after treatment. This information is not intended to replace advice given to you by your health care provider. Make sure you discuss any questions you have with your health care provider. Document Revised: 11/05/2017 Document Reviewed: 08/19/2017 Elsevier Patient Education  2020 Elsevier Inc.  

## 2020-05-22 NOTE — Telephone Encounter (Signed)
Pt seen today. Pt needing 1 month follow up per Dr Oscar La. Pt scheduled for 7/26 at 4:30 pm. Pt agreeable and verbalized understanding.  Encounter closed.

## 2020-05-22 NOTE — Telephone Encounter (Signed)
Dr. Oscar La would like to see patient in 1 month around (716/21) for follow up. Next available appointment is end of August. Need triage to assist in scheduling.

## 2020-05-22 NOTE — Progress Notes (Signed)
GYNECOLOGY  VISIT   HPI: 69 y.o.   Married White or Caucasian Not Hispanic or Latino  female   [id number] with No LMP recorded. Patient is postmenopausal.   here for 2wk post opt following a D&C hysteroscopy. Patient states that she had spotting for about 4 days.  She had endometrial and endocervical polyps. Pathology was benign.   GYNECOLOGIC HISTORY: No LMP recorded. Patient is postmenopausal. Contraception:PMP Menopausal hormone therapy: none        OB History    Gravida  0   Para  0   Term  0   Preterm  0   AB  0   Living  0     SAB  0   TAB  0   Ectopic  0   Multiple  0   Live Births  0              Patient Active Problem List   Diagnosis Date Noted  . Abnormal reflex 10/18/2019  . Numbness 10/18/2019  . B12 deficiency 10/18/2019  . Osteoporosis 01/04/2017  . Exercise-induced asthma 11/20/2016  . Upper airway cough syndrome 07/01/2016  . Dyspnea 07/01/2016  . Vitamin D insufficiency 05/24/2016  . Hyperlipemia 05/24/2016  . Palpitations 11/15/2014  . Meniere's disease 07/21/2007  . GASTROESOPHAGEAL REFLUX DISEASE, SEVERE 07/21/2007  . Hypothyroidism 07/08/2007  . Allergic rhinitis 07/08/2007    Past Medical History:  Diagnosis Date  . Anxiety   . Asthma    exercise induced asthma   . Essential hypertension   . GERD (gastroesophageal reflux disease)   . Heart murmur    mitral valve prolapse  . History of dermatitis   . History of migraine headaches yrs ago, none recent  . Hyperlipemia 05/24/2016  . Hypothyroidism   . Meniere's disease   . Palpitations    Rare PVCs by Holter monitor April 2011   . Sleep apnea    Reportedly mild, no cpap needed  . Tingling    both legs saw dr Epimenio Foot 04-23-2020 no cause found  . Vitamin D insufficiency 05/24/2016    Past Surgical History:  Procedure Laterality Date  . APPENDECTOMY  as child  . BREAST LUMPECTOMY  yrs ago   Benign  . DILATATION & CURETTAGE/HYSTEROSCOPY WITH MYOSURE N/A 05/07/2020    Procedure: DILATATION & CURETTAGE/HYSTEROSCOPY WITH MYOSURE/POLYP REMOVAL;  Surgeon: Romualdo Bolk, MD;  Location: [facility];  Service: Gynecology;  Laterality: N/A;  polyp removal    Current Outpatient Medications  Medication Sig Dispense Refill  . albuterol (VENTOLIN HFA) 108 (90 Base) MCG/ACT inhaler Inhale 2 puffs into the lungs every 6 (six) hours as needed. 8.5 g 1  . Cholecalciferol (VITAMIN D3 PO) Take 1,000 Units by mouth daily.    . Cyanocobalamin (B-12) 1000 MCG TABS Take by mouth daily.    Marland Kitchen LORazepam (ATIVAN) 0.5 MG tablet Use 1/2 to 1 tablet as needed for panic attacks/anxiety 10 tablet 0  . metoprolol succinate (TOPROL-XL) 100 MG 24 hr tablet TAKE 1 TABLET BY MOUTH ONCE DAILY WITH MEALS OR  IMMEDIATELY  FOLLOWING  A  MEAL 90 tablet 0  . omeprazole (PRILOSEC) 20 MG capsule Take 20 mg by mouth daily.    Marland Kitchen SYNTHROID 75 MCG tablet Take 1 tablet (75 mcg total) by mouth daily before breakfast. 90 tablet 1   No current facility-administered medications for this visit.     ALLERGIES: Tums [calcium carbonate antacid], Cephalexin, Penicillins, and Sulfamethoxazole-trimethoprim  Family History  Problem Relation Age  of Onset  . Melanoma Father 14  . Stroke Mother   . Asthma Mother   . Stroke Maternal Grandfather   . Asthma Brother   . Deep vein thrombosis Brother 40       had back surgery with post op complications of blood clots which were fatal  . Thyroid disease Neg Hx     Social History   Socioeconomic History  . Marital status: Married    Spouse name: Marcello Moores  . Number of children: 0  . Years of education: 32  . Highest education level: Not on file  Occupational History  . Occupation: [employer]    Employer: [employer]  Tobacco Use  . Smoking status: Never Smoker  . Smokeless tobacco: Never Used  Vaping Use  . Vaping Use: Never used  Substance and Sexual Activity  . Alcohol use: No  . Drug use: No  . Sexual activity: Not Currently  Other  Topics Concern  . Not on file  Social History Narrative   Work or School: retired - used to work for [employer] Situation: lives with husband      Spiritual Beliefs: Christian      Lifestyle: no regular exercise; diet is healthy      Right handed    No caffeine use:    Social Determinants of Radio broadcast assistant Strain:   . Difficulty of Paying Living Expenses:   Food Insecurity:   . Worried About Charity fundraiser in the Last Year:   . Arboriculturist in the Last Year:   Transportation Needs:   . Film/video editor (Medical):   Marland Kitchen Lack of Transportation (Non-Medical):   Physical Activity:   . Days of Exercise per Week:   . Minutes of Exercise per Session:   Stress:   . Feeling of Stress :   Social Connections:   . Frequency of Communication with Friends and Family:   . Frequency of Social Gatherings with Friends and Family:   . Attends Religious Services:   . Active Member of Clubs or Organizations:   . Attends Archivist Meetings:   Marland Kitchen Marital Status:   Intimate Partner Violence:   . Fear of Current or Ex-Partner:   . Emotionally Abused:   Marland Kitchen Physically Abused:   . Sexually Abused:     Review of Systems  All other systems reviewed and are negative.   PHYSICAL EXAMINATION:    There were no vitals taken for this visit.    General appearance: alert, cooperative and appears stated age   ASSESSMENT S/P hysteroscopy, polypectomy with benign pathology Vaginal atrophy, too uncomfortable to be sexually active    PLAN Will start vaginal estrogen F/u in one month, she may need vaginal dilators If she does attempt to be sexually active prior to her next visit, she should use lots of lubrication and she should control rate and depth of penetration.

## 2020-05-30 ENCOUNTER — Telehealth: Payer: Self-pay

## 2020-05-30 NOTE — Telephone Encounter (Signed)
Patient cancelled follow up appointment. Patient did not want to reschedule.

## 2020-06-12 DIAGNOSIS — K1123 Chronic sialoadenitis: Secondary | ICD-10-CM | POA: Diagnosis not present

## 2020-06-12 DIAGNOSIS — H6123 Impacted cerumen, bilateral: Secondary | ICD-10-CM | POA: Diagnosis not present

## 2020-07-01 ENCOUNTER — Ambulatory Visit: Payer: Self-pay | Admitting: Obstetrics and Gynecology

## 2020-07-03 DIAGNOSIS — K1123 Chronic sialoadenitis: Secondary | ICD-10-CM | POA: Diagnosis not present

## 2020-07-04 ENCOUNTER — Other Ambulatory Visit (HOSPITAL_COMMUNITY): Payer: Self-pay | Admitting: Otolaryngology

## 2020-07-04 ENCOUNTER — Other Ambulatory Visit: Payer: Self-pay | Admitting: Otolaryngology

## 2020-07-04 DIAGNOSIS — R220 Localized swelling, mass and lump, head: Secondary | ICD-10-CM

## 2020-07-11 DIAGNOSIS — H524 Presbyopia: Secondary | ICD-10-CM | POA: Diagnosis not present

## 2020-07-11 DIAGNOSIS — H25813 Combined forms of age-related cataract, bilateral: Secondary | ICD-10-CM | POA: Diagnosis not present

## 2020-07-11 DIAGNOSIS — H04123 Dry eye syndrome of bilateral lacrimal glands: Secondary | ICD-10-CM | POA: Diagnosis not present

## 2020-07-11 DIAGNOSIS — H0100A Unspecified blepharitis right eye, upper and lower eyelids: Secondary | ICD-10-CM | POA: Diagnosis not present

## 2020-07-26 ENCOUNTER — Ambulatory Visit (HOSPITAL_COMMUNITY)
Admission: RE | Admit: 2020-07-26 | Discharge: 2020-07-26 | Disposition: A | Discharge status: Home / Self Care | Payer: Medicare PPO | Source: Ambulatory Visit | Attending: Otolaryngology | Admitting: Otolaryngology

## 2020-07-26 ENCOUNTER — Other Ambulatory Visit: Payer: Self-pay

## 2020-07-26 DIAGNOSIS — M50323 Other cervical disc degeneration at C6-C7 level: Secondary | ICD-10-CM | POA: Diagnosis not present

## 2020-07-26 DIAGNOSIS — M50322 Other cervical disc degeneration at C5-C6 level: Secondary | ICD-10-CM | POA: Diagnosis not present

## 2020-07-26 DIAGNOSIS — R220 Localized swelling, mass and lump, head: Secondary | ICD-10-CM

## 2020-07-26 DIAGNOSIS — R221 Localized swelling, mass and lump, neck: Secondary | ICD-10-CM | POA: Diagnosis not present

## 2020-07-26 MED ORDER — IOHEXOL 300 MG/ML  SOLN
75.0000 mL | Freq: Once | INTRAMUSCULAR | Status: AC | PRN
  Administered 2020-07-26: 75 mL via INTRAVENOUS

## 2020-08-15 ENCOUNTER — Other Ambulatory Visit: Payer: Self-pay | Admitting: Family Medicine

## 2020-08-27 DIAGNOSIS — M5030 Other cervical disc degeneration, unspecified cervical region: Secondary | ICD-10-CM | POA: Diagnosis not present

## 2020-08-27 DIAGNOSIS — M542 Cervicalgia: Secondary | ICD-10-CM | POA: Diagnosis not present

## 2020-09-23 ENCOUNTER — Encounter: Payer: Self-pay | Admitting: Family Medicine

## 2020-09-23 NOTE — Telephone Encounter (Signed)
Spoke with the pt and informed her a visit is needed to evaluate this problem.  Appt scheduled for 10/19 to arrive at 10:45am with Dr Caryl Never as PCP does not have any openings.

## 2020-09-24 ENCOUNTER — Ambulatory Visit: Payer: Medicare PPO | Admitting: Family Medicine

## 2020-09-24 ENCOUNTER — Other Ambulatory Visit: Payer: Self-pay

## 2020-09-24 ENCOUNTER — Encounter: Payer: Self-pay | Admitting: Family Medicine

## 2020-09-24 VITALS — BP 120/60 | HR 56 | Temp 97.6°F | Ht 67.0 in | Wt 140.1 lb

## 2020-09-24 DIAGNOSIS — Z23 Encounter for immunization: Secondary | ICD-10-CM | POA: Diagnosis not present

## 2020-09-24 DIAGNOSIS — R350 Frequency of micturition: Secondary | ICD-10-CM | POA: Diagnosis not present

## 2020-09-24 DIAGNOSIS — E039 Hypothyroidism, unspecified: Secondary | ICD-10-CM | POA: Diagnosis not present

## 2020-09-24 LAB — POCT URINALYSIS DIP (CLINITEK)
Blood, UA: NEGATIVE
Glucose, UA: NEGATIVE mg/dL
Leukocytes, UA: NEGATIVE
Nitrite, UA: NEGATIVE
Spec Grav, UA: >=1.03 — AB (ref 1.010–1.025)
Urobilinogen, UA: NEGATIVE E.U./dL — AB
pH, UA: 5.5 (ref 5.0–8.0)

## 2020-09-24 MED ORDER — MIRABEGRON ER 25 MG PO TB24: 25.0000 mg | ORAL_TABLET | Freq: Every day | ORAL | 2 refills | Status: DC

## 2020-09-24 NOTE — Progress Notes (Signed)
Established Patient Office Visit  Subjective:  Patient ID: Tracey Giles, female    DOB: [birth year]  Age: 69 y.o. MRN: [id number]  CC:  Chief Complaint  Patient presents with  . Nocturia     stomach pain , burning with urination, no pressure, no blood in urine , no fever or chills     HPI Tracey Giles [facility] presents for 3 to 4-week history of urine frequency and occasional burning with urination.  Her main symptom is nocturia- getting up about 4 times per night.  She has definite sense of urgency.  No stress incontinence.  No hematuria.  No nausea or vomiting.  No fevers or chills.  She initially states that she has occasionally sensation of straining to urinate.  This seems to be worse at night.  No anticholinergic medication use.  No caffeine use.  Denies abdominal pain.  There is chief complaint of "stomach pain "and nursing notes but patient denies any true abdominal pain.  She has hypothyroidism and has not had labs in over a year.  Takes levothyroxine 75 mcg daily.  Compliant with therapy.  She is requesting thyroid check while she is here today.  Past Medical History:  Diagnosis Date  . Anxiety   . Asthma    exercise induced asthma   . Essential hypertension   . GERD (gastroesophageal reflux disease)   . Heart murmur    mitral valve prolapse  . History of dermatitis   . History of migraine headaches yrs ago, none recent  . Hyperlipemia 05/24/2016  . Hypothyroidism   . Meniere's disease   . Palpitations    Rare PVCs by Holter monitor April 2011   . Sleep apnea    Reportedly mild, no cpap needed  . Tingling    both legs saw dr Epimenio Foot 04-23-2020 no cause found  . Vitamin D insufficiency 05/24/2016    Past Surgical History:  Procedure Laterality Date  . APPENDECTOMY  as child  . BREAST LUMPECTOMY  yrs ago   Benign  . DILATATION & CURETTAGE/HYSTEROSCOPY WITH MYOSURE N/A 05/07/2020   Procedure: DILATATION & CURETTAGE/HYSTEROSCOPY WITH MYOSURE/POLYP REMOVAL;   Surgeon: Romualdo Bolk, MD;  Location: [facility] [place];  Service: Gynecology;  Laterality: N/A;  polyp removal    Family History  Problem Relation Age of Onset  . Melanoma Father 8  . Stroke Mother   . Asthma Mother   . Stroke Maternal Grandfather   . Asthma Brother   . Deep vein thrombosis Brother 45       had back surgery with post op complications of blood clots which were fatal  . Thyroid disease Neg Hx     Social History   Socioeconomic History  . Marital status: Married    Spouse name: Tracey Giles  . Number of children: 0  . Years of education: 56  . Highest education level: Not on file  Occupational History  . 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 Smoking status: Never Smoker  . Smokeless tobacco: Never Used  Vaping Use  . Vaping Use: Never used  Substance and Sexual Activity  . Alcohol use: No  . Drug use: No  . Sexual activity: Not Currently  Other Topics Concern  . Not on file  Social History Narrative   Work or School: retired - used to work for [employer] Situation: lives with husband      Spiritual Beliefs:  Christian      Lifestyle: no regular exercise; diet is healthy      Right handed    No caffeine use:    Social Determinants of Health   Financial Resource Strain:   . Difficulty of Paying Living Expenses: Not on file  Food Insecurity:   . Worried About Programme researcher, broadcasting/film/video in the Last Year: Not on file  . Ran Out of Food in the Last Year: Not on file  Transportation Needs:   . Lack of Transportation (Medical): Not on file  . Lack of Transportation (Non-Medical): Not on file  Physical Activity:   . Days of Exercise per Week: Not on file  . Minutes of Exercise per Session: Not on file  Stress:   . Feeling of Stress : Not on file  Social Connections:   . Frequency of Communication with Friends and Family: Not on file  . Frequency of Social Gatherings with Friends and Family: Not  on file  . Attends Religious Services: Not on file  . Active Member of Clubs or Organizations: Not on file  . Attends Banker Meetings: Not on file  . Marital Status: Not on file  Intimate Partner Violence:   . Fear of Current or Ex-Partner: Not on file  . Emotionally Abused: Not on file  . Physically Abused: Not on file  . Sexually Abused: Not on file    Outpatient Medications Prior to Visit  Medication Sig Dispense Refill  . albuterol (VENTOLIN HFA) 108 (90 Base) MCG/ACT inhaler Inhale 2 puffs into the lungs every 6 (six) hours as needed. 8.5 g 1  . Cholecalciferol (VITAMIN D3 PO) Take 1,000 Units by mouth daily.    . Cyanocobalamin (B-12) 1000 MCG TABS Take by mouth daily.    Marland Kitchen LORazepam (ATIVAN) 0.5 MG tablet Use 1/2 to 1 tablet as needed for panic attacks/anxiety 10 tablet 0  . metoprolol succinate (TOPROL-XL) 100 MG 24 hr tablet TAKE 1 TABLET BY MOUTH ONCE DAILY WITH  OR  IMMEDIATELY  FOLLOWING  A  MEAL 90 tablet 0  . omeprazole (PRILOSEC) 20 MG capsule Take 20 mg by mouth daily.    Marland Kitchen SYNTHROID 75 MCG tablet Take 1 tablet (75 mcg total) by mouth daily before breakfast. 90 tablet 1  . Estradiol 10 MCG TABS vaginal tablet Place one tablet vaginally qhs x 1 week, then change to 2 x a week at hs. (Patient not taking: Reported on 09/24/2020) 24 tablet 0   No facility-administered medications prior to visit.    Allergies  Allergen Reactions  . Tums [Calcium Carbonate Antacid] Hives    Burning sensation in mouth, rash on torso  . Cephalexin     itching  . Penicillins     itching  . Sulfamethoxazole-Trimethoprim     hives    ROS Review of Systems  Constitutional: Negative for appetite change, chills and fever.  Gastrointestinal: Negative for abdominal pain, constipation, diarrhea, nausea and vomiting.  Genitourinary: Positive for dysuria and frequency. Negative for flank pain and hematuria.  Musculoskeletal: Negative for back pain.  Neurological: Negative for  dizziness.      Objective:    Physical Exam Vitals reviewed.  Constitutional:      Appearance: Normal appearance.  Cardiovascular:     Rate and Rhythm: Normal rate and regular rhythm.  Pulmonary:     Effort: Pulmonary effort is normal.     Breath sounds: Normal breath sounds.  Abdominal:     Palpations: Abdomen is  soft.     Tenderness: There is no abdominal tenderness.  Neurological:     Mental Status: She is alert.     BP 120/60 (BP Location: Left Arm, Patient Position: Sitting, Cuff Size: Normal)   Pulse (!) 56   Temp 97.6 F (36.4 C) (Oral)   Ht 5\' 7"  (1.702 m)   Wt 140 lb 1.6 oz (63.5 kg)   SpO2 97%   BMI 21.94 kg/m  Wt Readings from Last 3 Encounters:  09/24/20 140 lb 1.6 oz (63.5 kg)  05/22/20 146 lb (66.2 kg)  05/07/20 147 lb 3.2 oz (66.8 kg)     Health Maintenance Due  Topic Date Due  . INFLUENZA VACCINE  07/07/2020    There are no preventive care reminders to display for this patient.  Lab Results  Component Value Date   TSH 1.48 06/26/2019   Lab Results  Component Value Date   WBC 7.8 06/26/2019   HGB 15.0 05/07/2020   HCT 44.0 05/07/2020   MCV 92.5 06/26/2019   PLT 285.0 06/26/2019   Lab Results  Component Value Date   NA 142 05/07/2020   K 5.2 (H) 05/07/2020   CO2 25 06/26/2019   GLUCOSE 103 (H) 05/07/2020   BUN 12 05/07/2020   CREATININE 0.80 05/07/2020   BILITOT 0.4 06/26/2019   ALKPHOS 102 06/26/2019   AST 15 06/26/2019   ALT 8 06/26/2019   PROT 6.7 06/26/2019   ALBUMIN 4.2 06/26/2019   CALCIUM 8.9 06/26/2019   GFR 68.40 06/26/2019   Lab Results  Component Value Date   CHOL 183 10/04/2018   Lab Results  Component Value Date   HDL 47.70 10/04/2018   Lab Results  Component Value Date   LDLCALC 110 (H) 10/04/2018   Lab Results  Component Value Date   TRIG 130.0 10/04/2018   Lab Results  Component Value Date   CHOLHDL 4 10/04/2018   Lab Results  Component Value Date   HGBA1C 5.6 10/04/2018      Assessment  & Plan:   Problem List Items Addressed This Visit      Unprioritized   Hypothyroidism   Relevant Orders   TSH    Other Visit Diagnoses    Urine frequency    -  Primary   Relevant Orders   POCT URINALYSIS DIP (CLINITEK) (Completed)   Need for influenza vaccination       Relevant Orders   Flu vaccine HIGH DOSE PF (Fluzone High dose)    Urine dipstick reveals trace ketones.  There are no signs of infection with negative blood, nitrites, and leukocytes.  She seems to have more of a urinary urgency.  No regular alcohol use and no caffeine use.  -Hydrate well during the day but avoid excessive fluids at night -Discussed trial of Myrbetriq 25 mg nightly. -Recheck TSH -Follow-up with primary if above not helping and symptoms persist  Meds ordered this encounter  Medications  . mirabegron ER (MYRBETRIQ) 25 MG TB24 tablet    Sig: Take 1 tablet (25 mg total) by mouth daily.    Dispense:  30 tablet    Refill:  2    Follow-up: No follow-ups on file.    10/06/2018, MD

## 2020-09-24 NOTE — Patient Instructions (Signed)

## 2020-09-25 LAB — TSH: TSH: 2.11 mIU/L (ref 0.40–4.50)

## 2020-10-30 DIAGNOSIS — W172XXA Fall into hole, initial encounter: Secondary | ICD-10-CM | POA: Diagnosis not present

## 2020-10-30 DIAGNOSIS — M7989 Other specified soft tissue disorders: Secondary | ICD-10-CM | POA: Diagnosis not present

## 2020-10-30 DIAGNOSIS — I10 Essential (primary) hypertension: Secondary | ICD-10-CM | POA: Diagnosis not present

## 2020-10-30 DIAGNOSIS — S92155A Nondisplaced avulsion fracture (chip fracture) of left talus, initial encounter for closed fracture: Secondary | ICD-10-CM | POA: Diagnosis not present

## 2020-10-30 DIAGNOSIS — M25552 Pain in left hip: Secondary | ICD-10-CM | POA: Diagnosis not present

## 2020-10-30 DIAGNOSIS — M79672 Pain in left foot: Secondary | ICD-10-CM | POA: Diagnosis not present

## 2020-11-03 IMAGING — CT CT NECK W/ CM
3 of 4 series · 12 of 34 positions shown, 14 images · IV contrast (Omnipaque or Isovue)
Comparison: Cervical spine MRI 11/12/2019. Thyroid ultrasound
12/31/2016. CT of the paranasal sinuses 07/02/2016.

CLINICAL DATA: Mass of right submandibular region.

EXAM:
CT NECK WITH CONTRAST
TECHNIQUE: Multidetector CT imaging of the neck was performed using the
standard protocol following the bolus administration of intravenous
contrast.
CONTRAST:  75mL OMNIPAQUE IOHEXOL 300 MG/ML  SOLN

[Series 5: cor neck · coronal · 0.35mm/px · 3 of 109 slices shown]
[im 22/109  bone]
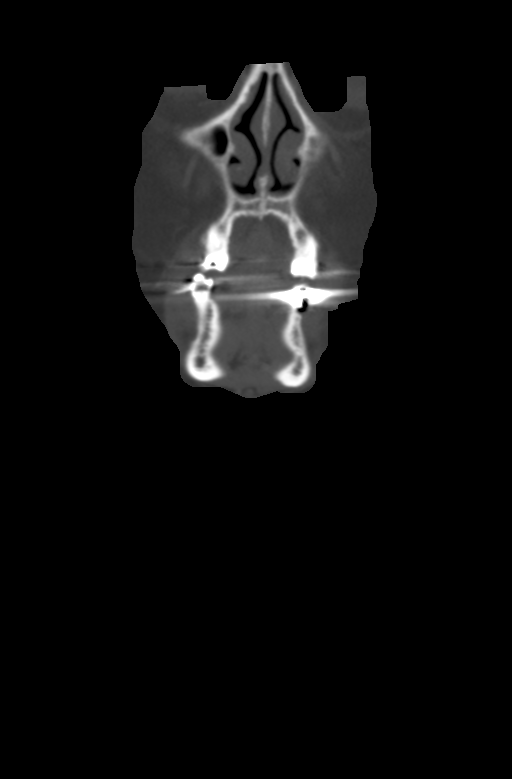
[im 44/109  bone]
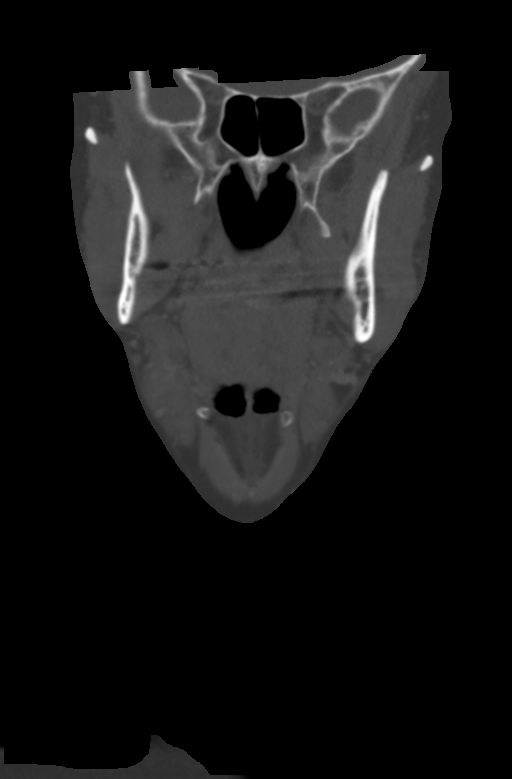
[im 65/109  bone]
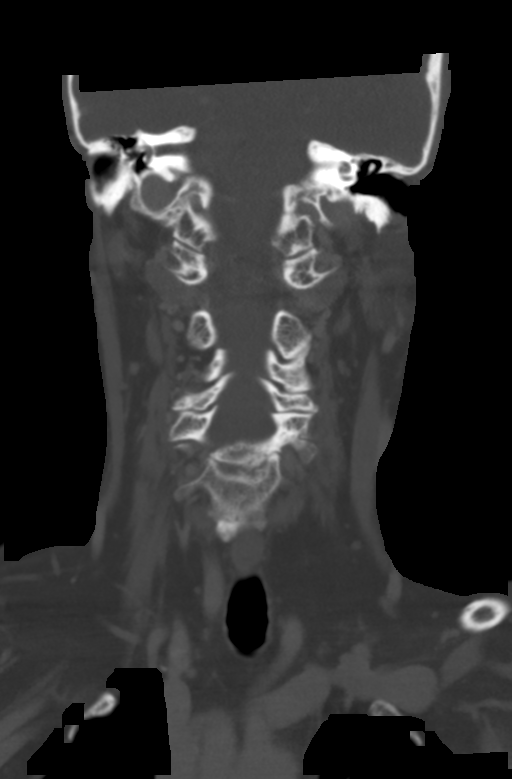

[Series 6: sag neck · sagittal · 0.52mm/px · 5 of 101 slices shown, 6 images]
[im 34/101  bone]
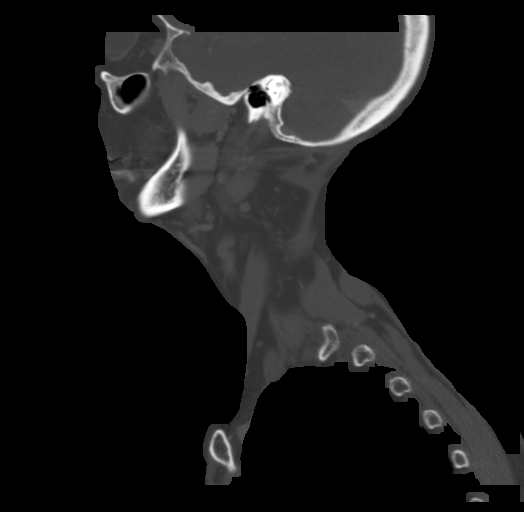
[im 42/101  bone]
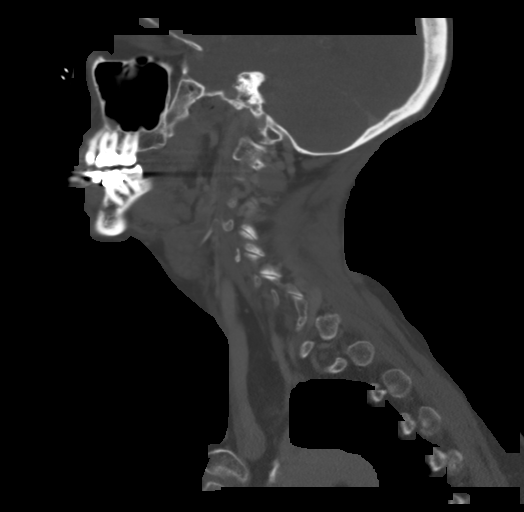
[im 51/101  soft-tissue]
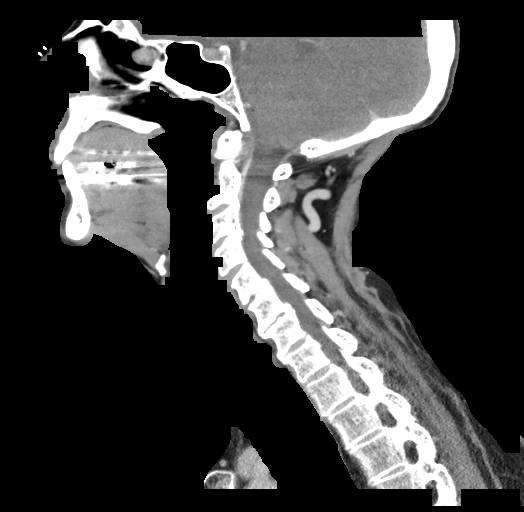
[im 51/101  bone]
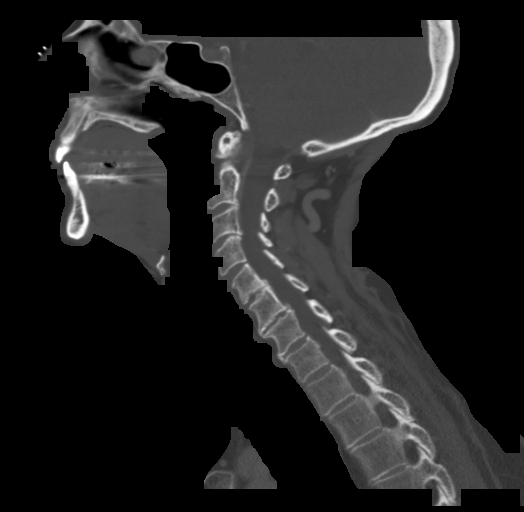
[im 59/101  bone]
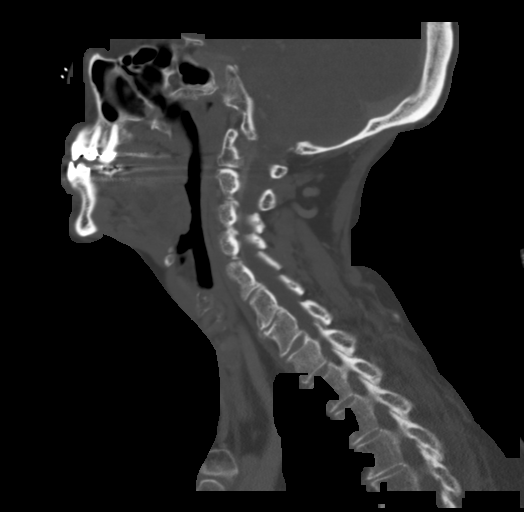
[im 67/101  bone]
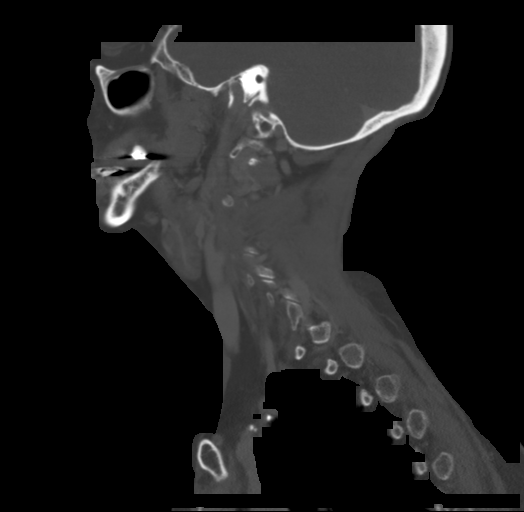

[Series 7: ax oropharynx · axial · 0.39mm/px · z∈[-81,+99]mm · 4 of 136 slices shown, 5 images]
[im 23/136  soft-tissue]
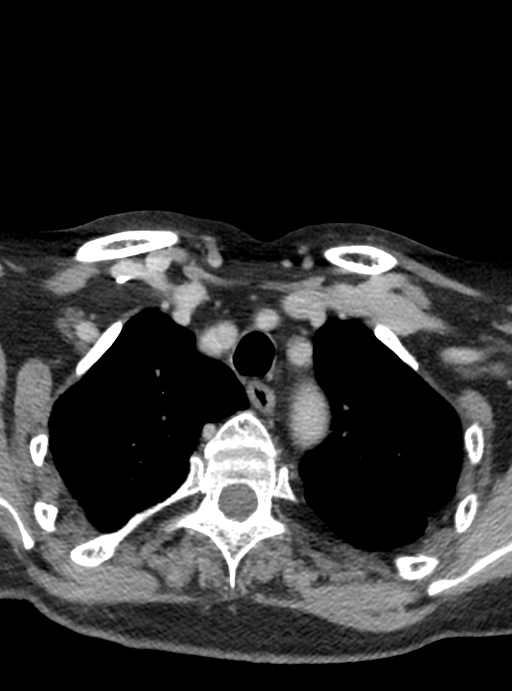
[im 23/136  bone]
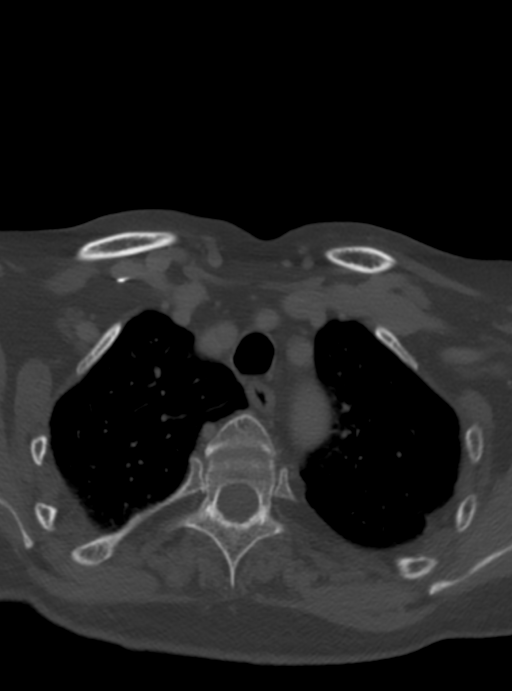
[im 46/136  bone]
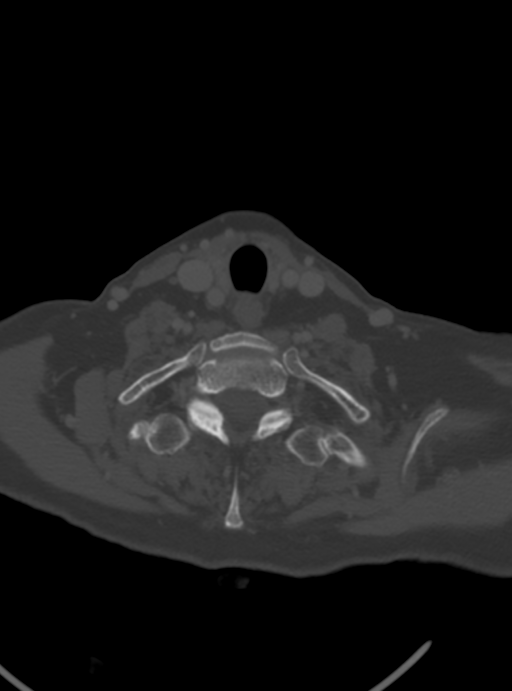
[im 91/136  bone]
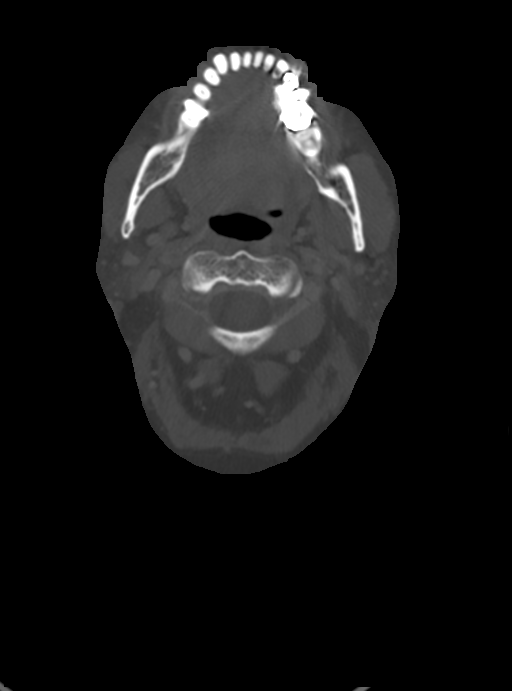
[im 113/136  bone]
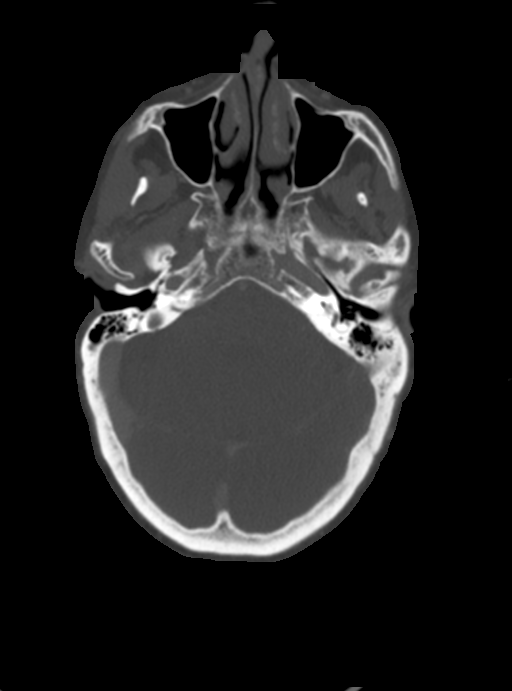

[12 of 34 positions shown; findings below may reference images not displayed]

FINDINGS: Pharynx and larynx: Streak artifact from dental restoration limits
evaluation of the oral cavity. Within this limitation, no swelling
or discrete mass is identified within the oral cavity, pharynx or
larynx.

Salivary glands: No inflammation, mass, or stone. Specifically, no
right submandibular gland mass is demonstrated.

Thyroid: The thyroid gland is small but otherwise unremarkable.

Lymph nodes: No pathologically enlarged cervical chain lymph nodes.

Vascular: The major vascular structures of the neck are patent.

Limited intracranial: No acute abnormality identified.

Visualized orbits: Incompletely imaged. Visualized orbits show no
acute finding.

Mastoids and visualized paranasal sinuses: Mild ethmoid and left
maxillary sinus mucosal thickening. No significant mastoid effusion.

Skeleton: No acute bony abnormality or aggressive osseous lesion.
Cervical spondylosis. Most notably at C5-C6 and C6-C7, there is
moderate/advanced disc degeneration with shallow posterior disc
osteophytes and uncovertebral hypertrophy.

Upper chest: No consolidation within the imaged lung apices.

No soft tissue neck
IMPRESSION: No mass or pathologically enlarged cervical chain lymph nodes are
identified. No submandibular gland mass is demonstrated.

Mild paranasal sinus mucosal thickening.

Cervical spondylosis greatest at C5-C6 and C6-C7.

## 2020-11-06 ENCOUNTER — Encounter: Payer: Self-pay | Admitting: Family Medicine

## 2020-11-06 DIAGNOSIS — S92909D Unspecified fracture of unspecified foot, subsequent encounter for fracture with routine healing: Secondary | ICD-10-CM

## 2020-11-06 DIAGNOSIS — M25569 Pain in unspecified knee: Secondary | ICD-10-CM

## 2020-11-18 ENCOUNTER — Other Ambulatory Visit: Payer: Self-pay | Admitting: Family Medicine

## 2020-11-18 DIAGNOSIS — F410 Panic disorder [episodic paroxysmal anxiety] without agoraphobia: Secondary | ICD-10-CM

## 2020-11-19 ENCOUNTER — Other Ambulatory Visit: Payer: Self-pay

## 2020-11-19 ENCOUNTER — Ambulatory Visit: Payer: Medicare PPO

## 2020-11-19 VITALS — BP 138/82 | HR 72 | Temp 98.4°F | Resp 20 | Wt 138.8 lb

## 2020-11-19 DIAGNOSIS — Z1211 Encounter for screening for malignant neoplasm of colon: Secondary | ICD-10-CM

## 2020-11-19 DIAGNOSIS — Z0000 Encounter for general adult medical examination without abnormal findings: Secondary | ICD-10-CM | POA: Diagnosis not present

## 2020-11-19 NOTE — Progress Notes (Signed)
Subjective:   Tracey Giles is a 69 y.o. female who presents for Medicare Annual (Subsequent) preventive examination.  Review of Systems     Cardiac Risk Factors include: advanced age (>74men, >92 women);dyslipidemia     Objective:    Today's Vitals   11/19/20 1037 11/19/20 1103  BP:  138/82  Pulse:  72  Resp:  20  Temp:  98.4 F (36.9 C)  SpO2:  97%  Weight:  138 lb 12.8 oz (63 kg)  PainSc: 4     Body mass index is 21.74 kg/m.  Advanced Directives 11/19/2020 05/07/2020 10/04/2018  Does Patient Have a Medical Advance Directive? No No No  Would patient like information on creating a medical advance directive? No - Patient declined No - Patient declined -    Current Medications (verified) Outpatient Encounter Medications as of 11/19/2020  Medication Sig  . albuterol (VENTOLIN HFA) 108 (90 Base) MCG/ACT inhaler Inhale 2 puffs into the lungs every 6 (six) hours as needed.  . Cholecalciferol (VITAMIN D3 PO) Take 1,000 Units by mouth daily.  . Cyanocobalamin (B-12) 1000 MCG TABS Take by mouth daily.  Marland Kitchen LORazepam (ATIVAN) 0.5 MG tablet Use 1/2 to 1 tablet as needed for panic attacks/anxiety  . metoprolol succinate (TOPROL-XL) 100 MG 24 hr tablet TAKE 1 TABLET BY MOUTH ONCE DAILY WITH  OR  IMMEDIATELY  FOLLOWING  A  MEAL  . omeprazole (PRILOSEC) 20 MG capsule Take 20 mg by mouth daily.  Marland Kitchen SYNTHROID 75 MCG tablet Take 1 tablet (75 mcg total) by mouth daily before breakfast.  . mirabegron ER (MYRBETRIQ) 25 MG TB24 tablet Take 1 tablet (25 mg total) by mouth daily. (Patient not taking: Reported on 11/19/2020)   No facility-administered encounter medications on file as of 11/19/2020.    Allergies (verified) Tums [calcium carbonate antacid], Cephalexin, Penicillins, and Sulfamethoxazole-trimethoprim   History: Past Medical History:  Diagnosis Date  . Anxiety   . Asthma    exercise induced asthma   . Essential hypertension   . GERD (gastroesophageal reflux disease)    . Heart murmur    mitral valve prolapse  . History of dermatitis   . History of migraine headaches yrs ago, none recent  . Hyperlipemia 05/24/2016  . Hypothyroidism   . Meniere's disease   . Palpitations    Rare PVCs by Holter monitor April 2011   . Sleep apnea    Reportedly mild, no cpap needed  . Tingling    both legs saw dr Epimenio Foot 04-23-2020 no cause found  . Vitamin D insufficiency 05/24/2016   Past Surgical History:  Procedure Laterality Date  . APPENDECTOMY  as child  . BREAST LUMPECTOMY  yrs ago   Benign  . DILATATION & CURETTAGE/HYSTEROSCOPY WITH MYOSURE N/A 05/07/2020   Procedure: DILATATION & CURETTAGE/HYSTEROSCOPY WITH MYOSURE/POLYP REMOVAL;  Surgeon: Romualdo Bolk, MD;  Location: [facility] [place];  Service: Gynecology;  Laterality: N/A;  polyp removal   Family History  Problem Relation Age of Onset  . Melanoma Father 46  . Stroke Mother   . Asthma Mother   . Stroke Maternal Grandfather   . Asthma Brother   . Deep vein thrombosis Brother 45       had back surgery with post op complications of blood clots which were fatal  . Thyroid disease Neg Hx    Social History   Socioeconomic History  . Marital status: Married    Spouse name: [name]  . Number of children: 0  .  Years of education: 3114  . Highest education level: Not on file  Occupational History  . 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 Smoking status: Never Smoker  . Smokeless tobacco: Never Used  Vaping Use  . Vaping Use: Never used  Substance and Sexual Activity  . Alcohol use: No  . Drug use: No  . Sexual activity: Not Currently  Other Topics Concern  . 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 Difficulty of Paying Living Expenses: Not hard at all  Food Insecurity: No Food Insecurity  . Worried About Programme researcher, broadcasting/film/videounning Out of Food in the Last Year: Never true  . Ran Out of Food in the Last Year: Never true  Transportation Needs: No Transportation Needs  . Lack of Transportation (Medical): No  . Lack of Transportation (Non-Medical): No  Physical Activity: Inactive  . Days of Exercise per Week: 0 days  . Minutes of Exercise per Session: 0 min  Stress: No Stress Concern Present  . Feeling of Stress : Not at all  Social Connections: Socially Integrated  . Frequency of Communication with Friends and Family: More than three times a week  . Frequency of Social Gatherings with Friends and Family: More than three times a week  . Attends Religious Services: More than 4 times per year  . Active Member of Clubs or Organizations: Yes  . Attends BankerClub or Organization Meetings: 1 to 4 times per year  . Marital Status: Married    Tobacco Counseling Counseling given: Not Answered   Clinical Intake:  Pre-visit preparation completed: Yes  Pain : 0-10 (4) Pain Score: 4  Pain Type: Other (Comment) (broken left foot) Pain Location: Foot Pain Orientation: Left Pain Descriptors / Indicators: Aching,Sore     BMI - recorded: 21.94 Nutritional Status: BMI of 19-24  Normal Nutritional Risks: None Diabetes: No  How often do you need to have someone help you when you read instructions, pamphlets, or other written materials from your doctor or pharmacy?: 1 - Never  Diabetic?No  Interpreter Needed?: No  Information entered by :: Lanier Ensignina Tateanna Bach, LPN   Activities of Daily Living In your present state of health, do you have any difficulty performing the following activities: 11/19/2020 05/07/2020  Hearing? Y N  Comment wears hearing aids -  Vision? N N  Difficulty concentrating or making decisions? N N  Walking or climbing stairs? N N  Dressing  or bathing? N N  Doing errands, shopping? N -  Preparing Food and eating ? N -  Using the Toilet? N -  In the past six months, have you accidently leaked urine? N -  Do you have problems with loss of bowel control? N -  Managing your Medications? N -  Managing your Finances? N -  Housekeeping or managing your Housekeeping? N -  Some recent data might be hidden    Patient Care Team: Wynn BankerKoberlein, Junell C, MD as PCP - General (Family Medicine) Jonelle SidleMcDowell, Samuel G, MD as PCP - Cardiology (Cardiology) Ermalinda BarriosKraus, Eric, MD as Consulting Physician (Otolaryngology)  Cherlyn Roberts, MD as Consulting Physician (Dermatology) Romualdo Bolk, MD as Consulting Physician (Obstetrics and Gynecology)  Indicate any recent Medical Services you may have received from other than [facility] providers in the past year (date may be approximate).     Assessment:   This is a routine wellness examination for Torsha.  Hearing/Vision screen  Hearing Screening   125Hz  250Hz  500Hz  1000Hz  2000Hz  3000Hz  4000Hz  6000Hz  8000Hz   Right ear:           Left ear:           Comments: Pt wear hearing aid   Vision Screening Comments: Pt follows up with [facility] for annual eye exams  Dietary issues and exercise activities discussed: Current Exercise Habits: The patient does not participate in regular exercise at present, Exercise limited by: orthopedic condition(s)  Goals    . Patient Stated     Continue to keep good skills for mental health and voluteer     . Patient Stated     Get back to exercising       Depression Screen PHQ 2/9 Scores 11/19/2020 10/10/2019 10/04/2018 09/30/2017 12/11/2016  PHQ - 2 Score 0 0 0 0 0    Fall Risk Fall Risk  11/19/2020 10/10/2019 10/04/2018 12/11/2016  Falls in the past year? 1 0 No No  Number falls in past yr: 1 0 - -  Injury with Fall? 1 0 - -  Comment broke top of left foot during accident in a home - - -  Risk for fall due to : Impaired mobility;Impaired  balance/gait;Impaired vision - - -  Follow up Falls prevention discussed - - -    FALL RISK PREVENTION PERTAINING TO THE HOME:  Any stairs in or around the home? No  If so, are there any without handrails? No  Home free of loose throw rugs in walkways, pet beds, electrical cords, etc? Yes  Adequate lighting in your home to reduce risk of falls? Yes   ASSISTIVE DEVICES UTILIZED TO PREVENT FALLS:  Life alert? No  Use of a cane, walker or w/c? No  Grab bars in the bathroom? No  Shower chair or bench in shower? No  Elevated toilet seat or a handicapped toilet? Yes   TIMED UP AND GO:  Was the test performed? Yes .  Length of time to ambulate 10 feet: 10 sec.   Gait steady and fast without use of assistive device  Cognitive Function: MMSE - Mini Mental State Exam 10/04/2018  Not completed: (No Data)     6CIT Screen 11/19/2020  What Year? 0 points  What month? 0 points  Count back from 20 0 points  Months in reverse 0 points  Repeat phrase 0 points    Immunizations Immunization History  Administered Date(s) Administered  . Fluad Quad(high Dose 65+) 10/05/2019, 09/24/2020  . Influenza Split 09/21/2011, 09/05/2012  . Influenza Whole 10/07/2001, 09/23/2007, 09/06/2008, 10/10/2009  . Influenza, High Dose Seasonal PF 09/15/2018  . Influenza,inj,Quad PF,6+ Mos 10/09/2013, 09/06/2014, 08/14/2016  . Influenza-Unspecified 09/14/2017, 09/19/2018  . Moderna Sars-Covid-2 Vaccination 01/19/2020, 02/16/2020, 10/15/2020  . Pneumococcal Conjugate-13 12/11/2016  . Pneumococcal Polysaccharide-23 12/30/2017  . Td 02/04/1998, 09/23/2007, 09/30/2017  . Zoster 06/03/2012    TDAP status: Up to date  Flu Vaccine status: Up to date  Pneumococcal vaccine status: Up to date  Covid-19 vaccine status: Completed vaccines  Qualifies for Shingles Vaccine? Yes   Zostavax completed No   Shingrix Completed?: No.    Education has been provided  regarding the importance of this vaccine. Patient  has been advised to call insurance company to determine out of pocket expense if they have not yet received this vaccine. Advised may also receive vaccine at local pharmacy or Health Dept. Verbalized acceptance and understanding.  Screening Tests Health Maintenance  Topic Date Due  . Fecal DNA (Cologuard)  10/12/2020  . DEXA SCAN  12/21/2020  . MAMMOGRAM  12/31/2021  . TETANUS/TDAP  10/01/2027  . INFLUENZA VACCINE  Completed  . COVID-19 Vaccine  Completed  . PNA vac Low Risk Adult  Completed  . Hepatitis C Screening  Addressed    Health Maintenance  Health Maintenance Due  Topic Date Due  . Fecal DNA (Cologuard)  10/12/2020    Colorectal cancer screening: Type of screening: Cologuard. Completed ordered 11/19/20. Repeat every 3 years  Mammogram status: Completed 01/01/20. Repeat every year  Bone Density status: Completed 12/21/18. Results reflect: Bone density results: OSTEOPOROSIS. Repeat every 2 years.   Additional Screening:  Hepatitis C Screening:  Completed 12/11/16 not specified  Vision Screening: Recommended annual ophthalmology exams for early detection of glaucoma and other disorders of the eye. Is the patient up to date with their annual eye exam?  Yes  Who is the provider or what is the name of the office in which the patient attends annual eye exams? [facility]    Dental Screening: Recommended annual dental exams for proper oral hygiene  Community Resource Referral / Chronic Care Management: CRR required this visit?  No   CCM required this visit?  No      Plan:     I have personally reviewed and noted the following in the patient's chart:   . Medical and social history . Use of alcohol, tobacco or illicit drugs  . Current medications and supplements . Functional ability and status . Nutritional status . Physical activity . Advanced directives . List of other physicians . Hospitalizations, surgeries, and ER visits in previous 12  months . Vitals . Screenings to include cognitive, depression, and falls . Referrals and appointments  In addition, I have reviewed and discussed with patient certain preventive protocols, quality metrics, and best practice recommendations. A written personalized care plan for preventive services as well as general preventive health recommendations were provided to patient.     Marzella Schlein, LPN   [id number]   Nurse Notes: None

## 2020-11-19 NOTE — Patient Instructions (Addendum)
Tracey Giles , Thank you for taking time to come for your Medicare Wellness Visit. I appreciate your ongoing commitment to your health goals. Please review the following plan we discussed and let me know if I can assist you in the future.   Screening recommendations/referrals: Colonoscopy: ordered cologuard 11/19/20 Mammogram: Done 01/01/20 Bone Density: Done 12/21/18 Recommended yearly ophthalmology/optometry visit for glaucoma screening and checkup Recommended yearly dental visit for hygiene and checkup  Vaccinations: Influenza vaccine: Up to date Done 09/24/20 Pneumococcal vaccine: Up to date Tdap vaccine: Up to date Shingles vaccine: Shingrix discussed. Please contact your pharmacy for coverage information.    Covid-19:Completed 01/19/20 & 02/16/20  Advanced directives: Please bring a copy of your health care power of attorney and living will to the office at your convenience.  Conditions/risks identified: get back to exercise   Next appointment: Follow up in one year for your annual wellness visit    Preventive Care 65 Years and Older, Female Preventive care refers to lifestyle choices and visits with your health care provider that can promote health and wellness. What does preventive care include?  A yearly physical exam. This is also called an annual well check.  Dental exams once or twice a year.  Routine eye exams. Ask your health care provider how often you should have your eyes checked.  Personal lifestyle choices, including:  Daily care of your teeth and gums.  Regular physical activity.  Eating a healthy diet.  Avoiding tobacco and drug use.  Limiting alcohol use.  Practicing safe sex.  Taking low-dose aspirin every day.  Taking vitamin and mineral supplements as recommended by your health care provider. What happens during an annual well check? The services and screenings done by your health care provider during your annual well check will depend on your  age, overall health, lifestyle risk factors, and family history of disease. Counseling  Your health care provider may ask you questions about your:  Alcohol use.  Tobacco use.  Drug use.  Emotional well-being.  Home and relationship well-being.  Sexual activity.  Eating habits.  History of falls.  Memory and ability to understand (cognition).  Work and work Astronomer.  Reproductive health. Screening  You may have the following tests or measurements:  Height, weight, and BMI.  Blood pressure.  Lipid and cholesterol levels. These may be checked every 5 years, or more frequently if you are over 27 years old.  Skin check.  Lung cancer screening. You may have this screening every year starting at age 48 if you have a 30-pack-year history of smoking and currently smoke or have quit within the past 15 years.  Fecal occult blood test (FOBT) of the stool. You may have this test every year starting at age 101.  Flexible sigmoidoscopy or colonoscopy. You may have a sigmoidoscopy every 5 years or a colonoscopy every 10 years starting at age 79.  Hepatitis C blood test.  Hepatitis B blood test.  Sexually transmitted disease (STD) testing.  Diabetes screening. This is done by checking your blood sugar (glucose) after you have not eaten for a while (fasting). You may have this done every 1-3 years.  Bone density scan. This is done to screen for osteoporosis. You may have this done starting at age 46.  Mammogram. This may be done every 1-2 years. Talk to your health care provider about how often you should have regular mammograms. Talk with your health care provider about your test results, treatment options, and if necessary, the need  for more tests. Vaccines  Your health care provider may recommend certain vaccines, such as:  Influenza vaccine. This is recommended every year.  Tetanus, diphtheria, and acellular pertussis (Tdap, Td) vaccine. You may need a Td booster  every 10 years.  Zoster vaccine. You may need this after age 37.  Pneumococcal 13-valent conjugate (PCV13) vaccine. One dose is recommended after age 74.  Pneumococcal polysaccharide (PPSV23) vaccine. One dose is recommended after age 71. Talk to your health care provider about which screenings and vaccines you need and how often you need them. This information is not intended to replace advice given to you by your health care provider. Make sure you discuss any questions you have with your health care provider. Document Released: 12/20/2015 Document Revised: 08/12/2016 Document Reviewed: 09/24/2015 Elsevier Interactive Patient Education  2017 [place] Prevention in the Home Falls can cause injuries. They can happen to people of all ages. There are many things you can do to make your home safe and to help prevent falls. What can I do on the outside of my home?  Regularly fix the edges of walkways and driveways and fix any cracks.  Remove anything that might make you trip as you walk through a door, such as a raised step or threshold.  Trim any bushes or trees on the path to your home.  Use bright outdoor lighting.  Clear any walking paths of anything that might make someone trip, such as rocks or tools.  Regularly check to see if handrails are loose or broken. Make sure that both sides of any steps have handrails.  Any raised decks and porches should have guardrails on the edges.  Have any leaves, snow, or ice cleared regularly.  Use sand or salt on walking paths during winter.  Clean up any spills in your garage right away. This includes oil or grease spills. What can I do in the bathroom?  Use night lights.  Install grab bars by the toilet and in the tub and shower. Do not use towel bars as grab bars.  Use non-skid mats or decals in the tub or shower.  If you need to sit down in the shower, use a plastic, non-slip stool.  Keep the floor dry. Clean up any  water that spills on the floor as soon as it happens.  Remove soap buildup in the tub or shower regularly.  Attach bath mats securely with double-sided non-slip rug tape.  Do not have throw rugs and other things on the floor that can make you trip. What can I do in the bedroom?  Use night lights.  Make sure that you have a light by your bed that is easy to reach.  Do not use any sheets or blankets that are too big for your bed. They should not hang down onto the floor.  Have a firm chair that has side arms. You can use this for support while you get dressed.  Do not have throw rugs and other things on the floor that can make you trip. What can I do in the kitchen?  Clean up any spills right away.  Avoid walking on wet floors.  Keep items that you use a lot in easy-to-reach places.  If you need to reach something above you, use a strong step stool that has a grab bar.  Keep electrical cords out of the way.  Do not use floor polish or wax that makes floors slippery. If you must use wax, use  non-skid floor wax.  Do not have throw rugs and other things on the floor that can make you trip. What can I do with my stairs?  Do not leave any items on the stairs.  Make sure that there are handrails on both sides of the stairs and use them. Fix handrails that are broken or loose. Make sure that handrails are as long as the stairways.  Check any carpeting to make sure that it is firmly attached to the stairs. Fix any carpet that is loose or worn.  Avoid having throw rugs at the top or bottom of the stairs. If you do have throw rugs, attach them to the floor with carpet tape.  Make sure that you have a light switch at the top of the stairs and the bottom of the stairs. If you do not have them, ask someone to add them for you. What else can I do to help prevent falls?  Wear shoes that:  Do not have high heels.  Have rubber bottoms.  Are comfortable and fit you well.  Are closed  at the toe. Do not wear sandals.  If you use a stepladder:  Make sure that it is fully opened. Do not climb a closed stepladder.  Make sure that both sides of the stepladder are locked into place.  Ask someone to hold it for you, if possible.  Clearly mark and make sure that you can see:  Any grab bars or handrails.  First and last steps.  Where the edge of each step is.  Use tools that help you move around (mobility aids) if they are needed. These include:  Canes.  Walkers.  Scooters.  Crutches.  Turn on the lights when you go into a dark area. Replace any light bulbs as soon as they burn out.  Set up your furniture so you have a clear path. Avoid moving your furniture around.  If any of your floors are uneven, fix them.  If there are any pets around you, be aware of where they are.  Review your medicines with your doctor. Some medicines can make you feel dizzy. This can increase your chance of falling. Ask your doctor what other things that you can do to help prevent falls. This information is not intended to replace advice given to you by your health care provider. Make sure you discuss any questions you have with your health care provider. Document Released: 09/19/2009 Document Revised: 04/30/2016 Document Reviewed: 12/28/2014 Elsevier Interactive Patient Education  2017 [employer].

## 2020-12-04 ENCOUNTER — Telehealth: Payer: Self-pay | Admitting: *Deleted

## 2020-12-04 NOTE — Telephone Encounter (Signed)
Spoke with the pt and she stated she has not received a Cologuard kit.  Order faxed to [facility] at [phone].

## 2020-12-04 NOTE — Telephone Encounter (Signed)
-----   Message from Terressa Koyanagi, DO sent at 12/03/2020  9:41 AM EST ----- Ronnald Collum, could you check on this overdue result? Thanks! ----- Message ----- From: SYSTEM Sent: 11/24/2020  12:16 AM EST To: Terressa Koyanagi, DO

## 2020-12-05 ENCOUNTER — Other Ambulatory Visit: Payer: Self-pay | Admitting: Adult Health

## 2020-12-05 ENCOUNTER — Other Ambulatory Visit: Payer: Self-pay | Admitting: Family Medicine

## 2020-12-05 ENCOUNTER — Telehealth: Payer: Self-pay | Admitting: Family Medicine

## 2020-12-05 DIAGNOSIS — F410 Panic disorder [episodic paroxysmal anxiety] without agoraphobia: Secondary | ICD-10-CM

## 2020-12-05 MED ORDER — LORAZEPAM 0.5 MG PO TABS: 0.2500 mg | ORAL_TABLET | Freq: Three times a day (TID) | ORAL | 0 refills | Status: AC | PRN

## 2020-12-05 NOTE — Telephone Encounter (Signed)
LORazepam (ATIVAN) 0.5 MG tablet pharmacy need clarification on direction [facility] - [place], [place] - [phone]

## 2020-12-05 NOTE — Telephone Encounter (Signed)
Per Morrie Sheldon at [place] the Rx was received.

## 2020-12-05 NOTE — Telephone Encounter (Signed)
This prescription is sent by [facility] this morning.  I clarified the prescription and resent it to the pharmacy.

## 2020-12-11 DIAGNOSIS — Z1211 Encounter for screening for malignant neoplasm of colon: Secondary | ICD-10-CM | POA: Diagnosis not present

## 2020-12-11 LAB — COLOGUARD: Cologuard: NEGATIVE

## 2020-12-23 LAB — COLOGUARD: COLOGUARD: NEGATIVE

## 2021-01-02 ENCOUNTER — Other Ambulatory Visit: Payer: Self-pay

## 2021-01-02 ENCOUNTER — Ambulatory Visit: Payer: Medicare PPO | Admitting: Family Medicine

## 2021-01-02 ENCOUNTER — Telehealth: Payer: Self-pay | Admitting: *Deleted

## 2021-01-02 ENCOUNTER — Encounter: Payer: Self-pay | Admitting: Family Medicine

## 2021-01-02 VITALS — BP 142/68 | HR 60 | Temp 97.8°F | Ht 67.0 in | Wt 138.3 lb

## 2021-01-02 DIAGNOSIS — K146 Glossodynia: Secondary | ICD-10-CM

## 2021-01-02 DIAGNOSIS — M810 Age-related osteoporosis without current pathological fracture: Secondary | ICD-10-CM

## 2021-01-02 DIAGNOSIS — E538 Deficiency of other specified B group vitamins: Secondary | ICD-10-CM

## 2021-01-02 DIAGNOSIS — Z862 Personal history of diseases of the blood and blood-forming organs and certain disorders involving the immune mechanism: Secondary | ICD-10-CM

## 2021-01-02 DIAGNOSIS — H04123 Dry eye syndrome of bilateral lacrimal glands: Secondary | ICD-10-CM

## 2021-01-02 DIAGNOSIS — K219 Gastro-esophageal reflux disease without esophagitis: Secondary | ICD-10-CM | POA: Diagnosis not present

## 2021-01-02 DIAGNOSIS — R1900 Intra-abdominal and pelvic swelling, mass and lump, unspecified site: Secondary | ICD-10-CM

## 2021-01-02 DIAGNOSIS — E785 Hyperlipidemia, unspecified: Secondary | ICD-10-CM | POA: Diagnosis not present

## 2021-01-02 DIAGNOSIS — E039 Hypothyroidism, unspecified: Secondary | ICD-10-CM | POA: Diagnosis not present

## 2021-01-02 LAB — CBC WITH DIFFERENTIAL/PLATELET
Basophils Absolute: 0.1 10*3/uL (ref 0.0–0.1)
Basophils Relative: 1.3 % (ref 0.0–3.0)
Eosinophils Absolute: 0.2 10*3/uL (ref 0.0–0.7)
Eosinophils Relative: 2.9 % (ref 0.0–5.0)
HCT: 41 % (ref 36.0–46.0)
Hemoglobin: 13.6 g/dL (ref 12.0–15.0)
Lymphocytes Relative: 27.8 % (ref 12.0–46.0)
Lymphs Abs: 1.6 10*3/uL (ref 0.7–4.0)
MCHC: 33.1 g/dL (ref 30.0–36.0)
MCV: 91.8 fl (ref 78.0–100.0)
Monocytes Absolute: 0.5 10*3/uL (ref 0.1–1.0)
Monocytes Relative: 7.9 % (ref 3.0–12.0)
Neutro Abs: 3.5 10*3/uL (ref 1.4–7.7)
Neutrophils Relative %: 60.1 % (ref 43.0–77.0)
Platelets: 282 10*3/uL (ref 150.0–400.0)
RBC: 4.46 Mil/uL (ref 3.87–5.11)
RDW: 13.5 % (ref 11.5–15.5)
WBC: 5.9 10*3/uL (ref 4.0–10.5)

## 2021-01-02 LAB — LIPID PANEL
Cholesterol: 179 mg/dL (ref 0–200)
HDL: 50.3 mg/dL (ref >39.00)
LDL Cholesterol: 101 mg/dL — ABNORMAL HIGH (ref 0–99)
NonHDL: 128.7
Total CHOL/HDL Ratio: 4
Triglycerides: 140 mg/dL (ref 0.0–149.0)
VLDL: 28 mg/dL (ref 0.0–40.0)

## 2021-01-02 LAB — COMPREHENSIVE METABOLIC PANEL
ALT: 6 U/L (ref 0–35)
AST: 14 U/L (ref 0–37)
Albumin: 4.1 g/dL (ref 3.5–5.2)
Alkaline Phosphatase: 77 U/L (ref 39–117)
BUN: 12 mg/dL (ref 6–23)
CO2: 30 mEq/L (ref 19–32)
Calcium: 9.4 mg/dL (ref 8.4–10.5)
Chloride: 107 mEq/L (ref 96–112)
Creatinine, Ser: 0.78 mg/dL (ref 0.40–1.20)
GFR: 77.53 mL/min (ref >60.00)
Glucose, Bld: 88 mg/dL (ref 70–99)
Potassium: 5.1 mEq/L (ref 3.5–5.1)
Sodium: 141 mEq/L (ref 135–145)
Total Bilirubin: 0.4 mg/dL (ref 0.2–1.2)
Total Protein: 6.8 g/dL (ref 6.0–8.3)

## 2021-01-02 LAB — FOLATE: Folate: 10.6 ng/mL (ref >5.9)

## 2021-01-02 LAB — TSH: TSH: 2 u[IU]/mL (ref 0.35–4.50)

## 2021-01-02 LAB — VITAMIN B12: Vitamin B-12: 265 pg/mL (ref 211–911)

## 2021-01-02 NOTE — Progress Notes (Signed)
Tracey Giles [facility] DOB: 10-28-1951 Encounter date: 01/02/2021  This is a 70 y.o. female who presents with Chief Complaint  Patient presents with  . Mouth Lesions    Patient complains of "blue spots" noted in her mouth x1 week    History of present illness: Has noted for a month dry lips that can't heal and then noted in mouth - blue spot in inner lip. Has tried lip balm and white petroleum jelly. Just keep peeling. 2 weeks ago were really red. Tried lip balm  And may have been irritant.   Mouth is a little tender on inside. Toothpaste seems aggravating to mouth. No recent illness. Appetite has been poor for 4-5 months. Started after mom died. She is working on eating well.   Little sinus congestion. No sore throat. Roof of mouth does burn though. No other sores in mouth. She hasn't had issue with lips/mouth in past.    Allergies  Allergen Reactions  . Tums [Calcium Carbonate Antacid] Hives    Burning sensation in mouth, rash on torso  . Cephalexin     itching  . Penicillins     itching  . Sulfamethoxazole-Trimethoprim     hives   Current Meds  Medication Sig  . albuterol (VENTOLIN HFA) 108 (90 Base) MCG/ACT inhaler Inhale 2 puffs into the lungs every 6 (six) hours as needed.  . Cholecalciferol (VITAMIN D3 PO) Take 1,000 Units by mouth daily.  . Cyanocobalamin (B-12) 1000 MCG TABS Take by mouth daily.  Marland Kitchen LORazepam (ATIVAN) 0.5 MG tablet Take 0.5-1 tablets (0.25-0.5 mg total) by mouth every 8 (eight) hours as needed for anxiety.  . metoprolol succinate (TOPROL-XL) 100 MG 24 hr tablet TAKE 1 TABLET BY MOUTH ONCE DAILY WITH OR IMMEDIATELY FOLLOWING A MEAL - PT NEEDS AN APPT  . mirabegron ER (MYRBETRIQ) 25 MG TB24 tablet Take 1 tablet (25 mg total) by mouth daily.  Marland Kitchen omeprazole (PRILOSEC) 20 MG capsule Take 20 mg by mouth daily.  Marland Kitchen SYNTHROID 75 MCG tablet TAKE 1 TABLET BY MOUTH ONCE DAILY BEFORE BREAKFAST    Review of Systems  Constitutional: Negative for chills, fatigue and  fever.  HENT: Positive for congestion (see hpi). Negative for ear pain, mouth sores, sinus pressure, sinus pain and sore throat.        See hpi  Respiratory: Negative for cough, chest tightness, shortness of breath and wheezing.   Cardiovascular: Negative for chest pain, palpitations and leg swelling.    Objective:  BP (!) 142/68 (BP Location: Left Arm, Patient Position: Sitting, Cuff Size: Normal)   Pulse 60   Temp 97.8 F (36.6 C) (Oral)   Ht 5\' 7"  (1.702 m)   Wt 138 lb 4.8 oz (62.7 kg)   BMI 21.66 kg/m   Weight: 138 lb 4.8 oz (62.7 kg)   BP Readings from Last 3 Encounters:  01/02/21 (!) 142/68  11/19/20 138/82  09/24/20 120/60   Wt Readings from Last 3 Encounters:  01/02/21 138 lb 4.8 oz (62.7 kg)  11/19/20 138 lb 12.8 oz (63 kg)  09/24/20 140 lb 1.6 oz (63.5 kg)    Physical Exam Constitutional:      General: She is not in acute distress.    Appearance: She is well-developed.  HENT:     Mouth/Throat:     Mouth: Mucous membranes are moist.     Tongue: No lesions.     Palate: No lesions.     Comments: Lips are extremely dry, scaled. No cracking in corners  of mouth. Inner lower lip there is small superficial vein visualized; no abnormal lesions or sores noted.  Neck:     Thyroid: No thyroid mass or thyromegaly.  Cardiovascular:     Rate and Rhythm: Normal rate and regular rhythm.     Heart sounds: Normal heart sounds. No murmur heard. No friction rub.  Pulmonary:     Effort: Pulmonary effort is normal. No respiratory distress.     Breath sounds: Normal breath sounds. No wheezing or rales.  Musculoskeletal:     Right lower leg: No edema.     Left lower leg: No edema.  Lymphadenopathy:     Cervical: No cervical adenopathy.  Neurological:     Mental Status: She is alert and oriented to person, place, and time.  Psychiatric:        Behavior: Behavior normal.     Assessment/Plan  1. Burning mouth syndrome This is subtle for her; will check some bloodwork and  determine necessary follow up pending these reults. - CBC with Differential/Platelet; Future - Zinc; Future - Folate; Future - Zinc - CBC with Differential/Platelet - Folate  2. Hypothyroidism, unspecified type Continue synthroid daily; will recheck bloodwork.  - TSH; Future - TSH - TSH  3. Gastroesophageal reflux disease without esophagitis Continue with omeprazole 20mg  daily  4. Osteoporosis, unspecified osteoporosis type, unspecified pathological fracture presence Continue with vitamin D, weight bearing exercise. Has declined medication in past. Last dexa 12/2018.  5. Hyperlipidemia, unspecified hyperlipidemia type Has been diet controlled; recheck. - Comprehensive metabolic panel; Future - Lipid panel; Future - Comprehensive metabolic panel - Lipid panel  6. B12 deficiency - Vitamin B12; Future - Folate; Future - Vitamin B12 - Folate  7. History of anemia - IBC + Ferritin; Future  8. Dry eyes - ANA; Future - Sjogren's syndrome antibods(ssa + ssb); Future - Sjogren's syndrome antibods(ssa + ssb) - ANA     Return for pending lab results.    01/2019, MD

## 2021-01-02 NOTE — Telephone Encounter (Signed)
Patient came in for an appt and questioned the results of the Cologuard test.  I called [facility] at [phone] and spoke with Pattricia Boss.  Pattricia Boss stated she could not initially locate the results as they were ordered under Dr Elmyra Ricks name.  Per Pattricia Boss the results were negative and she will fax the results by the end of today.  Patient was informed of the results.

## 2021-01-03 LAB — SJOGREN'S SYNDROME ANTIBODS(SSA + SSB)
SSA (Ro) (ENA) Antibody, IgG: <1 AI
SSB (La) (ENA) Antibody, IgG: <1 AI

## 2021-01-04 LAB — ZINC: Zinc: 67 ug/dL (ref 60–130)

## 2021-01-04 LAB — ANA: Anti Nuclear Antibody (ANA): NEGATIVE

## 2021-01-27 DIAGNOSIS — M8589 Other specified disorders of bone density and structure, multiple sites: Secondary | ICD-10-CM | POA: Diagnosis not present

## 2021-01-27 DIAGNOSIS — Z1231 Encounter for screening mammogram for malignant neoplasm of breast: Secondary | ICD-10-CM | POA: Diagnosis not present

## 2021-01-27 DIAGNOSIS — M810 Age-related osteoporosis without current pathological fracture: Secondary | ICD-10-CM | POA: Diagnosis not present

## 2021-01-27 DIAGNOSIS — Z780 Asymptomatic menopausal state: Secondary | ICD-10-CM | POA: Diagnosis not present

## 2021-01-27 LAB — HM DEXA SCAN

## 2021-01-27 LAB — HM MAMMOGRAPHY

## 2021-02-16 ENCOUNTER — Other Ambulatory Visit: Payer: Self-pay | Admitting: Family Medicine

## 2021-02-26 ENCOUNTER — Encounter: Payer: Self-pay | Admitting: Family Medicine

## 2021-02-28 ENCOUNTER — Other Ambulatory Visit: Payer: Self-pay

## 2021-02-28 ENCOUNTER — Ambulatory Visit: Payer: Medicare PPO | Admitting: Family Medicine

## 2021-02-28 ENCOUNTER — Encounter: Payer: Self-pay | Admitting: Family Medicine

## 2021-02-28 VITALS — BP 130/90 | HR 60 | Temp 97.6°F | Ht 67.0 in | Wt 138.2 lb

## 2021-02-28 DIAGNOSIS — R002 Palpitations: Secondary | ICD-10-CM | POA: Diagnosis not present

## 2021-02-28 DIAGNOSIS — J302 Other seasonal allergic rhinitis: Secondary | ICD-10-CM

## 2021-02-28 DIAGNOSIS — M810 Age-related osteoporosis without current pathological fracture: Secondary | ICD-10-CM | POA: Diagnosis not present

## 2021-02-28 LAB — CBC WITH DIFFERENTIAL/PLATELET
Basophils Absolute: 0.1 10*3/uL (ref 0.0–0.1)
Basophils Relative: 1.4 % (ref 0.0–3.0)
Eosinophils Absolute: 0.3 10*3/uL (ref 0.0–0.7)
Eosinophils Relative: 4.9 % (ref 0.0–5.0)
HCT: 40.9 % (ref 36.0–46.0)
Hemoglobin: 13.8 g/dL (ref 12.0–15.0)
Lymphocytes Relative: 27.3 % (ref 12.0–46.0)
Lymphs Abs: 1.7 10*3/uL (ref 0.7–4.0)
MCHC: 33.7 g/dL (ref 30.0–36.0)
MCV: 91.6 fl (ref 78.0–100.0)
Monocytes Absolute: 0.5 10*3/uL (ref 0.1–1.0)
Monocytes Relative: 7.9 % (ref 3.0–12.0)
Neutro Abs: 3.6 10*3/uL (ref 1.4–7.7)
Neutrophils Relative %: 58.5 % (ref 43.0–77.0)
Platelets: 265 10*3/uL (ref 150.0–400.0)
RBC: 4.46 Mil/uL (ref 3.87–5.11)
RDW: 13 % (ref 11.5–15.5)
WBC: 6.2 10*3/uL (ref 4.0–10.5)

## 2021-02-28 LAB — COMPREHENSIVE METABOLIC PANEL
ALT: 7 U/L (ref 0–35)
AST: 15 U/L (ref 0–37)
Albumin: 4.2 g/dL (ref 3.5–5.2)
Alkaline Phosphatase: 81 U/L (ref 39–117)
BUN: 16 mg/dL (ref 6–23)
CO2: 28 mEq/L (ref 19–32)
Calcium: 8.9 mg/dL (ref 8.4–10.5)
Chloride: 107 mEq/L (ref 96–112)
Creatinine, Ser: 0.83 mg/dL (ref 0.40–1.20)
GFR: 71.88 mL/min (ref >60.00)
Glucose, Bld: 98 mg/dL (ref 70–99)
Potassium: 4.5 mEq/L (ref 3.5–5.1)
Sodium: 141 mEq/L (ref 135–145)
Total Bilirubin: 0.3 mg/dL (ref 0.2–1.2)
Total Protein: 6.6 g/dL (ref 6.0–8.3)

## 2021-02-28 LAB — VITAMIN D 25 HYDROXY (VIT D DEFICIENCY, FRACTURES): VITD: 23.76 ng/mL — ABNORMAL LOW (ref 30.00–100.00)

## 2021-02-28 MED ORDER — LEVOCETIRIZINE DIHYDROCHLORIDE 5 MG PO TABS: 5.0000 mg | ORAL_TABLET | Freq: Every evening | ORAL | 5 refills | Status: AC

## 2021-02-28 NOTE — Progress Notes (Signed)
Tracey Giles [facility] DOB: 05-12-1951 Encounter date: 02/28/2021  This is a 70 y.o. female who presents with Chief Complaint  Patient presents with  . Results    Patient presents to discuss results of the bone density    History of present illness: Recent DEXA 01/27/21 with significant increase AP spine; signif decrease right hip; left stable.   Having more palpitations in last 2 weeks - not sure why. Notes more when she sits down and is still - like a little gurgle and then still and then restarts normal. Lasts seconds.   Has plenty of energy when she gets up but if she slows down than she can't do anything.   Right sided headaches; these are chronic for her. Come 2x/week. Go away with tylenol.   meniers with ringing left ear.  Allergies  Allergen Reactions  . Tums [Calcium Carbonate Antacid] Hives    Burning sensation in mouth, rash on torso  . Cephalexin     itching  . Penicillins     itching  . Sulfamethoxazole-Trimethoprim     hives   Current Meds  Medication Sig  . albuterol (VENTOLIN HFA) 108 (90 Base) MCG/ACT inhaler Inhale 2 puffs into the lungs every 6 (six) hours as needed.  . Cholecalciferol (VITAMIN D3 PO) Take 1,000 Units by mouth daily.  . Cyanocobalamin (B-12) 1000 MCG TABS Take by mouth daily.  Marland Kitchen levocetirizine (XYZAL) 5 MG tablet Take 1 tablet (5 mg total) by mouth every evening.  Marland Kitchen LORazepam (ATIVAN) 0.5 MG tablet Take 0.5-1 tablets (0.25-0.5 mg total) by mouth every 8 (eight) hours as needed for anxiety.  . metoprolol succinate (TOPROL-XL) 100 MG 24 hr tablet TAKE 1 TABLET BY MOUTH ONCE DAILY WITH OR IMMEDIATELY FOLLOWING A MEAL - PT NEEDS AN APPT  . omeprazole (PRILOSEC) 20 MG capsule Take 20 mg by mouth daily.  Marland Kitchen SYNTHROID 75 MCG tablet TAKE 1 TABLET BY MOUTH ONCE DAILY BEFORE BREAKFAST    Review of Systems  Constitutional: Negative for chills, fatigue and fever.  Respiratory: Negative for cough, chest tightness, shortness of breath and wheezing.    Cardiovascular: Positive for palpitations. Negative for chest pain and leg swelling.    Objective:  BP 130/90 (BP Location: Left Arm, Patient Position: Sitting, Cuff Size: Normal)   Pulse 60   Temp 97.6 F (36.4 C) (Oral)   Ht 5\' 7"  (1.702 m)   Wt 138 lb 3.2 oz (62.7 kg)   SpO2 98%   BMI 21.65 kg/m   Weight: 138 lb 3.2 oz (62.7 kg)   BP Readings from Last 3 Encounters:  02/28/21 130/90  01/02/21 (!) 142/68  11/19/20 138/82   Wt Readings from Last 3 Encounters:  02/28/21 138 lb 3.2 oz (62.7 kg)  01/02/21 138 lb 4.8 oz (62.7 kg)  11/19/20 138 lb 12.8 oz (63 kg)    Physical Exam Constitutional:      General: She is not in acute distress.    Appearance: She is well-developed.  Cardiovascular:     Rate and Rhythm: Normal rate and regular rhythm.  Extrasystoles are present.    Heart sounds: Murmur heard.   Systolic murmur is present with a grade of 2/6. No friction rub.  Pulmonary:     Effort: Pulmonary effort is normal. No respiratory distress.     Breath sounds: Normal breath sounds. No wheezing or rales.  Musculoskeletal:     Right lower leg: No edema.     Left lower leg: No edema.  Neurological:  Mental Status: She is alert and oriented to person, place, and time.  Psychiatric:        Behavior: Behavior normal.     Assessment/Plan  1. Osteoporosis, unspecified osteoporosis type, unspecified pathological fracture presence Patient worries about medications for bone density, even after discussion of treatment options.  She prefers to work on maintaining adequate amounts of calcium and vitamin D as well as weightbearing exercise.  We will plan to recheck check DEXA in 2 years time. - VITAMIN D 25 Hydroxy (Vit-D Deficiency, Fractures); Future - VITAMIN D 25 Hydroxy (Vit-D Deficiency, Fractures)  2. Palpitations EKG stable sinus rhythm.  No acute changes.  We will check blood work today to make sure these are also within normal range. - EKG 12-Lead - CBC with  Differential/Platelet; Future - Comprehensive metabolic panel; Future - Comprehensive metabolic panel - CBC with Differential/Platelet  3. Seasonal allergies - levocetirizine (XYZAL) 5 MG tablet; Take 1 tablet (5 mg total) by mouth every evening.  Dispense: 30 tablet; Refill: 5   Return for pending bloodwork.    [name], MD

## 2021-02-28 NOTE — Patient Instructions (Signed)
Consider viactiv chew or oscal for calcium-vitamin D supplement.  Osteoporosis  Osteoporosis happens when the bones become thin and less dense than normal. Osteoporosis makes bones more brittle and fragile and more likely to break (fracture). Over time, osteoporosis can cause your bones to become so weak that they fracture after a minor fall. Bones in the hip, wrist, and spine are most likely to fracture due to osteoporosis. What are the causes? The exact cause of this condition is not known. What increases the risk? You are more likely to develop this condition if you:  Have family members with this condition.  Have poor nutrition.  Use the following: ? Steroid medicines, such as prednisone. ? Anti-seizure medicines. ? Nicotine or tobacco, such as cigarettes, e-cigarettes, and chewing tobacco.  Are female.  Are age 60 or older.  Are not physically active (are sedentary).  Are of European or Asian descent.  Have a small body frame. What are the signs or symptoms? A fracture might be the first sign of osteoporosis, especially if the fracture results from a fall or injury that usually would not cause a bone to break. Other signs and symptoms include:  Pain in the neck or low back.  Stooped posture.  Loss of height. How is this diagnosed? This condition may be diagnosed based on:  Your medical history.  A physical exam.  A bone mineral density test, also called a DXA or DEXA test (dual-energy X-ray absorptiometry test). This test uses X-rays to measure the amount of minerals in your bones. How is this treated? This condition may be treated by:  Making lifestyle changes, such as: ? Including foods with more calcium and vitamin D in your diet. ? Doing weight-bearing and muscle-strengthening exercises. ? Stopping tobacco use. ? Limiting alcohol intake.  Taking medicine to slow the process of bone loss or to increase bone density.  Taking daily supplements of calcium  and vitamin D.  Taking hormone replacement medicines, such as estrogen for women and testosterone for men.  Monitoring your levels of calcium and vitamin D. The goal of treatment is to strengthen your bones and lower your risk for a fracture. Follow these instructions at home: Eating and drinking Include calcium and vitamin D in your diet. Calcium is important for bone health, and vitamin D helps your body absorb calcium. Good sources of calcium and vitamin D include:  Certain fatty fish, such as salmon and tuna.  Products that have calcium and vitamin D added to them (are fortified), such as fortified cereals.  Egg yolks.  Cheese.  Liver.   Activity Do exercises as told by your health care provider. Ask your health care provider what exercises and activities are safe for you. You should do:  Exercises that make you work against gravity (weight-bearing exercises), such as tai chi, yoga, or walking.  Exercises to strengthen muscles, such as lifting weights. Lifestyle  Do not drink alcohol if: ? Your health care provider tells you not to drink. ? You are pregnant, may be pregnant, or are planning to become pregnant.  If you drink alcohol: ? Limit how much you use to:  0-1 drink a day for women.  0-2 drinks a day for men.  Know how much alcohol is in your drink. In the [place] one drink equals one 12 oz bottle of beer (355 mL), one 5 oz glass of wine (148 mL), or one 1 oz glass of hard liquor (44 mL).  Do not use any products that contain  nicotine or tobacco, such as cigarettes, e-cigarettes, and chewing tobacco. If you need help quitting, ask your health care provider. Preventing falls  Use devices to help you move around (mobility aids) as needed, such as canes, walkers, scooters, or crutches.  Keep rooms well-lit and clutter-free.  Remove tripping hazards from walkways, including cords and throw rugs.  Install grab bars in bathrooms and safety rails on stairs.  Use  rubber mats in the bathroom and other areas that are often wet or slippery.  Wear closed-toe shoes that fit well and support your feet. Wear shoes that have rubber soles or low heels.  Review your medicines with your health care provider. Some medicines can cause dizziness or changes in blood pressure, which can increase your risk of falling. General instructions  Take over-the-counter and prescription medicines only as told by your health care provider.  Keep all follow-up visits. This is important. Contact a health care provider if:  You have never been screened for osteoporosis and you are: ? A woman who is age 42 or older. ? A man who is age 73 or older. Get help right away if:  You fall or injure yourself. Summary  Osteoporosis is thinning and loss of density in your bones. This makes bones more brittle and fragile and more likely to break (fracture),even with minor falls.  The goal of treatment is to strengthen your bones and lower your risk for a fracture.  Include calcium and vitamin D in your diet. Calcium is important for bone health, and vitamin D helps your body absorb calcium.  Talk with your health care provider about screening for osteoporosis if you are a woman who is age 34 or older, or a man who is age 3 or older. This information is not intended to replace advice given to you by your health care provider. Make sure you discuss any questions you have with your health care provider. Document Revised: 05/09/2020 Document Reviewed: 05/09/2020 Elsevier Patient Education  2021 [place] for Osteoporosis Osteoporosis causes your bones to become weak and brittle. This puts you at greater risk for bone breaks (fractures) from small bumps or falls. Making changes to your diet and increasing your physical activity can help strengthen your bones and improve your overall health. Calcium and vitamin D are nutrients that play an important role in bone health.  Vitamin D helps your body use calcium and strengthen bones. It is important to get enough calcium and vitamin D as part of your eating plan for osteoporosis. What are tips for following this plan? Reading food labels  Try to get at least 1,000 milligrams (mg) of calcium each day.  Look for foods that have at least 50 mg of calcium per serving.  Talk with your health care provider about taking a calcium supplement if you do not get enough calcium from food.  Do not have more than 2,500 mg of calcium each day. This is the upper limit for food and nutritional supplements combined. Too much calcium may cause constipation and prevent you from absorbing other important nutrients.  Choose foods that contain vitamin D.  Take a daily vitamin supplement that contains 800-1,000 international units (IU) of vitamin D. The amount may be different depending on your age, body weight, and where you live. Talk with your dietitian or health care provider about how much vitamin D is right for you.  Avoid foods that have more than 300 mg of sodium per serving. Too much sodium  can cause your body to lose calcium.  Talk with your dietitian or health care provider about how much sodium you are allowed each day. Shopping  Do not buy foods with added salt, including: ? Salted snacks. ? Angie Fava. ? Canned soups. ? Canned meats. ? Processed meats, such as bacon or precooked or cured meat like sausages or meat loaves. ? Smoked fish. Meal planning  Eat balanced meals that contain protein foods, fruits and vegetables, and foods rich in calcium and vitamin D.  Eat at least 5 servings of fruits and vegetables each day.  Eat 5-6 oz (142-170 g) of lean meat, poultry, fish, eggs, or beans each day. Lifestyle  Do not use any products that contain nicotine or tobacco, such as cigarettes, e-cigarettes, and chewing tobacco. If you need help quitting, ask your health care provider.  If your health care provider  recommends that you lose weight: ? Work with a dietitian to develop an eating plan that will help you reach your desired weight goal. ? Exercise for at least 30 minutes a day, 5 or more days a week, or as told by your health care provider.  Work with a physical therapist to develop an exercise plan that includes flexibility, balance, and strength exercises. Do not focus only on aerobic exercise.  Do not drink alcohol if: ? Your health care provider tells you not to drink. ? You are pregnant, may be pregnant, or are planning to become pregnant.  If you drink alcohol: ? Limit how much you use to:  0-1 drink a day for women.  0-2 drinks a day for men. ? Be aware of how much alcohol is in your drink. In the [place] one drink equals one 12 oz bottle of beer (355 mL), one 5 oz glass of wine (148 mL), or one 1 oz glass of hard liquor (44 mL). What foods should I eat? Foods high in calcium  Yogurt. Yogurt with fruit.  Milk. Evaporated skim milk. Dry milk powder.  Calcium-fortified orange juice.  Parmesan cheese. Part-skim ricotta cheese. Natural hard cheese. Cream cheese. Cottage cheese.  Canned sardines. Canned salmon.  Calcium-treated tofu. Calcium-fortified cereal bar. Calcium-fortified cereal. Calcium-fortified graham crackers.  Cooked collard greens. Turnip greens. Broccoli. Kale.  Almonds.  White beans.  Corn tortilla.   Foods high in vitamin D  Cod liver oil. Fatty fish, such as tuna, mackerel, and salmon.  Milk. Fortified soy milk. Fortified fruit juice.  Yogurt. Margarine.  Egg yolks. Foods high in protein  Beef. Lamb. Pork tenderloin.  Chicken breast.  Tuna (canned). Fish fillet.  Tofu.  Cooked soy beans. Soy patty. Beans (canned or cooked).  Cottage cheese.  Yogurt.  Peanut butter.  Pumpkin seeds. Nuts. Sunflower seeds.  Hard cheese.  Milk or other milk products, such as soy milk. The items listed above may not be a complete list of foods and  beverages you can eat. Contact a dietitian for more options. Summary  Calcium and vitamin D are nutrients that play an important role in bone health and are an important part of your eating plan for osteoporosis.  Eat balanced meals that contain protein foods, fruits and vegetables, and foods rich in calcium and vitamin D.  Avoid foods that have more than 300 mg of sodium per serving. Too much sodium can cause your body to lose calcium.  Exercise is an important part of prevention and treatment of osteoporosis. Aim for at least 30 minutes a day, 5 days a week. This information is  not intended to replace advice given to you by your health care provider. Make sure you discuss any questions you have with your health care provider. Document Revised: 05/09/2020 Document Reviewed: 05/09/2020 Elsevier Patient Education  [place].

## 2021-03-24 ENCOUNTER — Encounter: Payer: Self-pay | Admitting: Family Medicine

## 2021-03-24 DIAGNOSIS — R002 Palpitations: Secondary | ICD-10-CM

## 2021-03-25 NOTE — Addendum Note (Signed)
Addended by: Johnella Moloney on: 03/25/2021 08:42 AM   Modules accepted: Orders

## 2021-03-28 ENCOUNTER — Ambulatory Visit (INDEPENDENT_AMBULATORY_CARE_PROVIDER_SITE_OTHER): Payer: Medicare PPO

## 2021-03-28 DIAGNOSIS — R002 Palpitations: Secondary | ICD-10-CM | POA: Diagnosis not present

## 2021-04-09 ENCOUNTER — Telehealth: Payer: Self-pay | Admitting: Internal Medicine

## 2021-04-09 NOTE — Telephone Encounter (Signed)
Received a call from [employer]. Patient triggered for symptoms of syncope. Recording showed sinus bradycardia with a heart rate of 54 bpm which does not explain her symptoms. [employer] has reached out to the patient. Will update Dr. Mayford Knife.  Feliberto Harts, MD  Cardiology [facility]

## 2021-04-10 NOTE — Telephone Encounter (Signed)
[employer] monitor received on this pt, in regards to this recording. Cardiac report day 13-Critical Pt activated report showing pt had sinus brady w/artifact at rate of 54 bpm on 04/09/21 at 6:38 CST. On-call notified, as indicated in this note.   This is still an established Dr. Diona Browner pt at our [facility] office in [place].  PCP Dr. Stark Jock ordered this monitor on the pt, for mychart message sent to them on 4/18, where pt complained of palpitations.   Will have our Medical Records dept fax this recording to the appropriate [facility] office, Dr. Diona Browner at [place].  Will route this message to both triage pools, to make nursing aware of incoming [employer] monitor being sent, so that they can address with their DOD and follow-up with the pt accordingly.

## 2021-04-10 NOTE — Telephone Encounter (Signed)
Fax received

## 2021-04-10 NOTE — Telephone Encounter (Signed)
Thank you.  Would simply continue monitoring and then I can follow-up on the entire report when it is available.

## 2021-04-10 NOTE — Telephone Encounter (Addendum)
I called this pt to inquire if she was symptomatic during recorded event.  Pt states she was asymptomatic from a cardiac perspective.  She had no chest pain, no weakness, dizziness, pre--syncopal or syncopal episodes.  Pt states during this time she was sitting down resting.  Pt states the only complaint she has, is feeling fatigued.  Advised the pt that we will continue with the monitoring process. Informed her that I will route this message to Dr. Diona Browner and her PCP as a general FYI.  Pt verbalized understanding and agrees with this plan.   Off note:  Dr. Diona Browner and Dr. Mayford Knife, I spoke with [facility] our monitor tech, and she will have the Provider switched from Dr. Mayford Knife to you,  through [employer].  She said unfortunately, the notifications will still be sent to our office, because this is the location in which the monitor was placed on the pt. Our office will get the fax and send them over to you thereafter.  Thanks for all your help!

## 2021-04-10 NOTE — Telephone Encounter (Signed)
I have not seen Tracey Giles since January 2020.  I see that a 30-day monitor was ordered by Dr. Caryl Never for evaluation of palpitations.  I am not certain which [facility] provider was assigned to read this monitor, but looks like it is Dr. Mayford Knife.  If I am to follow-up on this, we need to switch the cardiac monitor reading to me so that I get further communications.  I reviewed the faxed tracings which shows sinus bradycardia in the 50s as well as lead motion artifact.  Would reach out to the patient to see what symptoms she was experiencing, otherwise continue to wear the monitor.

## 2021-04-10 NOTE — Telephone Encounter (Signed)
Message sent to PCP as there are no openings available soon.

## 2021-04-10 NOTE — Telephone Encounter (Signed)
Noted  

## 2021-04-14 NOTE — Telephone Encounter (Signed)
I would suggest for her to set up follow up visit with cardiology since I do not see that she has one. They will be/are reviewing everything as it records and then can come up with follow up plan for her.

## 2021-04-14 NOTE — Telephone Encounter (Signed)
Spoke with the pt and informed her of the message below.  Patient agreed to call [phone] for an appointment.

## 2021-05-15 ENCOUNTER — Other Ambulatory Visit: Payer: Self-pay

## 2021-05-16 ENCOUNTER — Ambulatory Visit: Payer: Medicare PPO | Admitting: Family Medicine

## 2021-05-16 ENCOUNTER — Encounter: Payer: Self-pay | Admitting: Family Medicine

## 2021-05-16 VITALS — BP 140/80 | HR 62 | Temp 97.6°F | Ht 67.0 in | Wt 138.5 lb

## 2021-05-16 DIAGNOSIS — R002 Palpitations: Secondary | ICD-10-CM

## 2021-05-16 DIAGNOSIS — R0683 Snoring: Secondary | ICD-10-CM | POA: Diagnosis not present

## 2021-05-16 DIAGNOSIS — E538 Deficiency of other specified B group vitamins: Secondary | ICD-10-CM | POA: Diagnosis not present

## 2021-05-16 DIAGNOSIS — R5383 Other fatigue: Secondary | ICD-10-CM

## 2021-05-16 DIAGNOSIS — E559 Vitamin D deficiency, unspecified: Secondary | ICD-10-CM

## 2021-05-16 DIAGNOSIS — Z862 Personal history of diseases of the blood and blood-forming organs and certain disorders involving the immune mechanism: Secondary | ICD-10-CM | POA: Diagnosis not present

## 2021-05-16 LAB — IBC + FERRITIN
Ferritin: 84.9 ng/mL (ref 10.0–291.0)
Iron: 41 ug/dL — ABNORMAL LOW (ref 42–145)
Saturation Ratios: 11.4 % — ABNORMAL LOW (ref 20.0–50.0)
Transferrin: 257 mg/dL (ref 212.0–360.0)

## 2021-05-16 LAB — VITAMIN D 25 HYDROXY (VIT D DEFICIENCY, FRACTURES): VITD: 30.81 ng/mL (ref 30.00–100.00)

## 2021-05-16 LAB — FOLATE: Folate: 7.4 ng/mL (ref >5.9)

## 2021-05-16 LAB — VITAMIN B12: Vitamin B-12: 315 pg/mL (ref 211–911)

## 2021-05-16 LAB — FERRITIN: Ferritin: 84.9 ng/mL (ref 10.0–291.0)

## 2021-05-16 LAB — MAGNESIUM: Magnesium: 2.3 mg/dL (ref 1.5–2.5)

## 2021-05-16 MED ORDER — METOPROLOL SUCCINATE ER 100 MG PO TB24: ORAL_TABLET | ORAL | 1 refills | Status: DC

## 2021-05-16 MED ORDER — SYNTHROID 75 MCG PO TABS: 75.0000 ug | ORAL_TABLET | Freq: Every day | ORAL | 1 refills | Status: DC

## 2021-05-16 NOTE — Progress Notes (Signed)
Tracey Giles [facility] DOB: 11-13-1951 Encounter date: 05/16/2021  This is a 70 y.o. female who presents with Chief Complaint  Patient presents with   Palpitations    Patient complains of recurrent palpitations, states cardiologist referred her back to PCP    History of present illness:  Dx with mvp in last 20's and has had palpitations off and on. Can't think of time they are worse - can act up with laying in bed, after eating, sitting; just any time. Usually just gets a little gurgle and then it goes away. When heart flips then can hear it in ear  as well.   Has been more fatigued in last year. States that appetite is not there as much.  Doesn't check her bp at home regularly. She states she will start.   No problems feeling light headed/dizzy.   States she doesn't sleep well. Most she gets is 6 hours. States that husband states that he hears her snore sometimes. In past month or so will wake up and feel like she is not breathing. This has happened a couple of times.   No muscle cramps except at night in calves, rare.   Has chronic migraines - no change in frequency; usually gets them every couple of weeks; less severe than when she was younger.   Mood has been stable.   Not exercising as much as she should; not eating like she should - states needs more veggies/fruits.   Avoids caffeine, salt due to menieres. If she has something sweet it is a couple of cookies. Potato chips are down fall but does limit self.   Mouth burning has resolved.   This provider is not able to see where she was told to follow up with PCP from cardiologist.   Allergies  Allergen Reactions   Tums [Calcium Carbonate Antacid] Hives    Burning sensation in mouth, rash on torso   Cephalexin     itching   Penicillins     itching   Sulfamethoxazole-Trimethoprim     hives   Current Meds  Medication Sig   albuterol (VENTOLIN HFA) 108 (90 Base) MCG/ACT inhaler Inhale 2 puffs into the lungs every 6  (six) hours as needed.   Cholecalciferol (VITAMIN D3 PO) Take 1,000 Units by mouth daily.   Cyanocobalamin (B-12) 1000 MCG TABS Take by mouth daily.   levocetirizine (XYZAL) 5 MG tablet Take 1 tablet (5 mg total) by mouth every evening.   LORazepam (ATIVAN) 0.5 MG tablet Take 0.5-1 tablets (0.25-0.5 mg total) by mouth every 8 (eight) hours as needed for anxiety.   metoprolol succinate (TOPROL-XL) 100 MG 24 hr tablet TAKE 1 TABLET BY MOUTH ONCE DAILY WITH OR IMMEDIATELY FOLLOWING A MEAL - PT NEEDS AN APPT   omeprazole (PRILOSEC) 20 MG capsule Take 20 mg by mouth daily.   SYNTHROID 75 MCG tablet TAKE 1 TABLET BY MOUTH ONCE DAILY BEFORE BREAKFAST    Review of Systems  Constitutional:  Positive for fatigue. Negative for chills and fever.  Respiratory:  Negative for cough, chest tightness, shortness of breath and wheezing.   Cardiovascular:  Positive for palpitations. Negative for chest pain and leg swelling.   Objective:  BP 140/80 (BP Location: Left Arm, Patient Position: Sitting, Cuff Size: Normal)   Pulse 62   Temp 97.6 F (36.4 C) (Oral)   Ht 5\' 7"  (1.702 m)   Wt 138 lb 8 oz (62.8 kg)   SpO2 98%   BMI 21.69 kg/m   Weight: 138  lb 8 oz (62.8 kg)   BP Readings from Last 3 Encounters:  05/16/21 140/80  02/28/21 130/90  01/02/21 (!) 142/68   Wt Readings from Last 3 Encounters:  05/16/21 138 lb 8 oz (62.8 kg)  02/28/21 138 lb 3.2 oz (62.7 kg)  01/02/21 138 lb 4.8 oz (62.7 kg)    Physical Exam Constitutional:      General: She is not in acute distress.    Appearance: She is well-developed.  Cardiovascular:     Rate and Rhythm: Normal rate and regular rhythm.     Heart sounds: Normal heart sounds. No murmur heard.   No friction rub.  Pulmonary:     Effort: Pulmonary effort is normal. No respiratory distress.     Breath sounds: Normal breath sounds. No wheezing or rales.  Musculoskeletal:     Right lower leg: No edema.     Left lower leg: No edema.  Neurological:      Mental Status: She is alert and oriented to person, place, and time.  Psychiatric:        Behavior: Behavior normal.    Assessment/Plan  1. Vitamin D deficiency History of deficiency. Want to make sure normal for bone density purposes as well as energy.  - VITAMIN D 25 Hydroxy (Vit-D Deficiency, Fractures); Future - VITAMIN D 25 Hydroxy (Vit-D Deficiency, Fractures)  2. Fatigue, unspecified type Checking bloodwork today; further eval pending results.   3. Palpitations Normal cardiac echo from 01/06/19, PAC/PVCs noted on cardiac monitoring. We discussed lower HR today and that this may limit beta blocker adjustments. She is on toprol 100mg  daily currently. After labwork consider return to carlogy.  - Magnesium; Future - Magnesium  4. History of anemia - Ferritin; Future - Iron and TIBC; Future - Iron and TIBC - Ferritin - IBC + Ferritin  5. B12 deficiency - Vitamin B12; Future - Homocysteine; Future - Methylmalonic acid, serum; Future - Folate; Future - Folate - Methylmalonic acid, serum - Homocysteine - Vitamin B12  6. Snoring We discussed snoring; this seems newer onset for her. We discussed that this could affect palpitations. She is willing to further evaluate. - Ambulatory referral to Sleep Studies   Return for pending results.     , MD

## 2021-05-17 LAB — IRON AND TIBC
Iron Saturation: 16 % (ref 15–55)
Iron: 48 ug/dL (ref 27–139)
Total Iron Binding Capacity: 295 ug/dL (ref 250–450)
UIBC: 247 ug/dL (ref 118–369)

## 2021-05-21 LAB — HOMOCYSTEINE: Homocysteine: 12.9 umol/L — ABNORMAL HIGH (ref <10.4)

## 2021-05-21 LAB — METHYLMALONIC ACID, SERUM: Methylmalonic Acid, Quant: 179 nmol/L (ref 87–318)

## 2021-05-22 ENCOUNTER — Other Ambulatory Visit: Payer: Self-pay | Admitting: Family Medicine

## 2021-05-22 DIAGNOSIS — E538 Deficiency of other specified B group vitamins: Secondary | ICD-10-CM

## 2021-05-26 ENCOUNTER — Encounter: Payer: Self-pay | Admitting: Family Medicine

## 2021-06-25 NOTE — Progress Notes (Signed)
Cardiology Office Note  Date: 06/26/2021   ID: Tracey Giles, DOB 04-06-1951, MRN [id number]  PCP:  Wynn Banker, MD  Cardiologist:  Nona Dell, MD Electrophysiologist:  None   Chief Complaint  Patient presents with   Follow-up palpitations    History of Present Illness: Tracey Giles is a 70 y.o. female last seen in January 2020.  She presents back to the office to follow-up on interval palpitations and heart monitor ordered by PCP.  Cardiac monitor in May of this year showed sinus rhythm with average heart rate 60 bpm, rare PACs and PVCs without any significant arrhythmias.  We discussed the results today.  She still describes a feeling of skips, tends to be mostly when she is still, much less noticeable during the day when she is active.  No associated chest pain or syncope.  She is already on Toprol-XL 100 mg daily.  No unusual caffeine intake, last TSH was normal on Synthroid.  Past Medical History:  Diagnosis Date   Anxiety    Asthma    exercise induced asthma    Essential hypertension    GERD (gastroesophageal reflux disease)    Heart murmur    mitral valve prolapse   History of dermatitis    History of migraine headaches yrs ago, none recent   Hyperlipemia 05/24/2016   Hypothyroidism    Meniere's disease    Palpitations    Rare PVCs by Holter monitor April 2011    Sleep apnea    Reportedly mild, no cpap needed   Tingling    both legs saw dr Epimenio Foot 04-23-2020 no cause found   Vitamin D insufficiency 05/24/2016    Past Surgical History:  Procedure Laterality Date   APPENDECTOMY  as child   BREAST LUMPECTOMY  yrs ago   Benign   DILATATION & CURETTAGE/HYSTEROSCOPY WITH MYOSURE N/A 05/07/2020   Procedure: DILATATION & CURETTAGE/HYSTEROSCOPY WITH MYOSURE/POLYP REMOVAL;  Surgeon: Romualdo Bolk, MD;  Location: [facility];  Service: Gynecology;  Laterality: N/A;  polyp removal    Current Outpatient Medications   Medication Sig Dispense Refill   albuterol (VENTOLIN HFA) 108 (90 Base) MCG/ACT inhaler Inhale 2 puffs into the lungs every 6 (six) hours as needed. 8.5 g 1   Cholecalciferol (VITAMIN D3 PO) Take 1,000 Units by mouth daily.     Cyanocobalamin (B-12) 1000 MCG TABS Take by mouth daily.     levocetirizine (XYZAL) 5 MG tablet Take 1 tablet (5 mg total) by mouth every evening. 30 tablet 5   LORazepam (ATIVAN) 0.5 MG tablet Take 0.5-1 tablets (0.25-0.5 mg total) by mouth every 8 (eight) hours as needed for anxiety. 10 tablet 0   metoprolol succinate (TOPROL-XL) 100 MG 24 hr tablet Take with or immediately following a meal. 90 tablet 1   omeprazole (PRILOSEC) 20 MG capsule Take 20 mg by mouth daily.     SYNTHROID 75 MCG tablet Take 1 tablet (75 mcg total) by mouth daily before breakfast. 90 tablet 1   No current facility-administered medications for this visit.   Allergies:  Tums [calcium carbonate antacid], Cephalexin, Penicillins, and Sulfamethoxazole-trimethoprim   ROS: No syncope.  Physical Exam: VS:  BP 134/78   Pulse 68   Ht 5\' 7"  (1.702 m)   Wt 138 lb (62.6 kg)   SpO2 97%   BMI 21.61 kg/m , BMI Body mass index is 21.61 kg/m.  Wt Readings from Last 3 Encounters:  06/26/21 138 lb (62.6 kg)  05/16/21  138 lb 8 oz (62.8 kg)  02/28/21 138 lb 3.2 oz (62.7 kg)    General: Patient appears comfortable at rest. HEENT: Conjunctiva and lids normal, wearing a mask. Neck: Supple, no elevated JVP or carotid bruits, no thyromegaly. Lungs: Clear to auscultation, nonlabored breathing at rest. Cardiac: Regular rate and rhythm, no S3 or significant systolic murmur, no pericardial rub. Extremities: No pitting edema.  ECG:  An ECG dated 02/28/2021 was personally reviewed today and demonstrated:  Sinus rhythm.  Recent Labwork: 01/02/2021: TSH 2.00 02/28/2021: ALT 7; AST 15; BUN 16; Creatinine, Ser 0.83; Hemoglobin 13.8; Platelets 265.0; Potassium 4.5; Sodium 141 05/16/2021: Magnesium 2.3      Component Value Date/Time   CHOL 179 01/02/2021 1313   TRIG 140.0 01/02/2021 1313   HDL 50.30 01/02/2021 1313   CHOLHDL 4 01/02/2021 1313   VLDL 28.0 01/02/2021 1313   LDLCALC 101 (H) 01/02/2021 1313    Other Studies Reviewed Today:  Echocardiogram 01/06/2019:  1. The left ventricle has hyperdynamic systolic function of >65%. The  cavity size is normal. There is mild focal basal septal left ventricular  wall thickness. Echo evidence of impaired relaxation diastolic filling  patterns. Normal left ventricular  filling pressures.   2. Normal left atrial size.   3. Normal right atrial size.   4. Normal tricuspid valve.   5. No atrial level shunt detected by color flow Doppler.   Cardiac monitor May 2022: [employer] monitor reviewed.  28 days, 17 hours analyzed.  Predominant rhythm is sinus with heart rate ranging from 42 bpm up to 105 bpm and average heart rate 60 bpm.  There were rare PACs and PVCs noted representing less than 1% total beats.  In general reported symptoms of "flutter or skipped beats" did tend to correlate with atrial and ventricular ectopy.  There were no sustained arrhythmias or pauses however.  Assessment and Plan:  1.  Palpitations, cardiac monitor demonstrating rare PACs and PVCs with no specific arrhythmias.  She is on Toprol-XL at reasonable dose and her resting heart rate is in the 60s.  Overall benign, no further work-up recommended at this time from a cardiac perspective.  We did talk about a regular walking plan for exercise.  She already avoids caffeine in her diet.  2.  Reported history of mitral valve prolapse, echocardiogram in 2020 showed normal LVEF and no significant valvular abnormalities.  Medication Adjustments/Labs and Tests Ordered: Current medicines are reviewed at length with the patient today.  Concerns regarding medicines are outlined above.   Tests Ordered: No orders of the defined types were placed in this encounter.   Medication  Changes: No orders of the defined types were placed in this encounter.   Disposition:  Follow up  1 year.  Signed, Jonelle Sidle, MD, [facility] 06/26/2021 10:28 AM    [facility] [address] [place], [place] [address] Phone: [phone]; Fax: [phone]

## 2021-06-26 ENCOUNTER — Ambulatory Visit: Payer: Medicare PPO | Admitting: Cardiology

## 2021-06-26 ENCOUNTER — Other Ambulatory Visit: Payer: Self-pay

## 2021-06-26 ENCOUNTER — Encounter: Payer: Self-pay | Admitting: Cardiology

## 2021-06-26 VITALS — BP 134/78 | HR 68 | Ht 67.0 in | Wt 138.0 lb

## 2021-06-26 DIAGNOSIS — R002 Palpitations: Secondary | ICD-10-CM

## 2021-06-26 NOTE — Patient Instructions (Signed)
Medication Instructions:  Your physician recommends that you continue on your current medications as directed. Please refer to the Current Medication list given to you today.  *If you need a refill on your cardiac medications before your next appointment, please call your pharmacy*   Lab Work: None today  If you have labs (blood work) drawn today and your tests are completely normal, you will receive your results only by: MyChart Message (if you have MyChart) OR A paper copy in the mail If you have any lab test that is abnormal or we need to change your treatment, we will call you to review the results.   Testing/Procedures: None today    Follow-Up: At [facility], you and your health needs are our priority.  As part of our continuing mission to provide you with exceptional heart care, we have created designated Provider Care Teams.  These Care Teams include your primary Cardiologist (physician) and Advanced Practice Providers (APPs -  Physician Assistants and Nurse Practitioners) who all work together to provide you with the care you need, when you need it.  We recommend signing up for the patient portal called "MyChart".  Sign up information is provided on this After Visit Summary.  MyChart is used to connect with patients for Virtual Visits (Telemedicine).  Patients are able to view lab/test results, encounter notes, upcoming appointments, etc.  Non-urgent messages can be sent to your provider as well.   To learn more about what you can do with MyChart, go to [email or web address].    Your next appointment:   12 month(s)  The format for your next appointment:   In Person  Provider:      Other Instructions None

## 2021-07-14 DIAGNOSIS — H04123 Dry eye syndrome of bilateral lacrimal glands: Secondary | ICD-10-CM | POA: Diagnosis not present

## 2021-07-14 DIAGNOSIS — H2513 Age-related nuclear cataract, bilateral: Secondary | ICD-10-CM | POA: Diagnosis not present

## 2021-07-14 DIAGNOSIS — H524 Presbyopia: Secondary | ICD-10-CM | POA: Diagnosis not present

## 2021-07-14 DIAGNOSIS — H52203 Unspecified astigmatism, bilateral: Secondary | ICD-10-CM | POA: Diagnosis not present

## 2021-07-24 ENCOUNTER — Ambulatory Visit: Payer: Medicare PPO | Admitting: Neurology

## 2021-07-24 ENCOUNTER — Institutional Professional Consult (permissible substitution): Payer: Medicare PPO | Admitting: Neurology

## 2021-07-24 ENCOUNTER — Encounter: Payer: Self-pay | Admitting: Neurology

## 2021-07-24 VITALS — BP 153/76 | HR 59 | Ht 67.0 in | Wt 138.0 lb

## 2021-07-24 DIAGNOSIS — R200 Anesthesia of skin: Secondary | ICD-10-CM

## 2021-07-24 DIAGNOSIS — H8102 Meniere's disease, left ear: Secondary | ICD-10-CM | POA: Diagnosis not present

## 2021-07-24 DIAGNOSIS — R002 Palpitations: Secondary | ICD-10-CM

## 2021-07-24 DIAGNOSIS — R292 Abnormal reflex: Secondary | ICD-10-CM

## 2021-07-24 DIAGNOSIS — G4733 Obstructive sleep apnea (adult) (pediatric): Secondary | ICD-10-CM

## 2021-07-24 DIAGNOSIS — E538 Deficiency of other specified B group vitamins: Secondary | ICD-10-CM

## 2021-07-24 NOTE — Progress Notes (Signed)
[facility]  PATIENT: Tracey Giles DOB: 06-01-1951  REFERRING DOCTOR OR PCP:  [name], MD SOURCE: Patient, notes from PCP  _________________________________   HISTORICAL  CHIEF COMPLAINT:  Chief Complaint  Patient presents with   New Patient (Initial Visit)    Rm 1, alone. Internal referral pt is here to see if her snoring is worsening her heart palpitations, last few months they have become daily. Pt had a ss about 20 years ago.     HISTORY OF PRESENT ILLNESS:  I had the pleasure of seeing the patient, Tracey Giles, at [facility].  I have seen her in the past for numbness.  She has a new issue with difficulties with snoring and possible OSA.  She is a 70 year old woman with heart palpitations referred for evaluation of possible OSA.    Telemetry 04/2021 showed some PACs and PVCs but no worrisome arrhythmia.    Echo in 2020 showed normal LVEF and no valvular issues.     She snores softly. She has no tbeen noted to have pauses in breathing.   She had a PSG 20 years ago, when she had some OSA.    She lost 30 pounds and snoring and gasping at night improved.   PSG did not show eonugh OSA to treat per her recollection.    On a typical night, she goes ot bed at 9 pm and she takes one hour to fall asleep.   She wakes up x 2 to urinate.  She sleeps about 6 hours and get up around 4 am and gets out of bed.   She is somewhat refreshed when she wakes up.   She has a dry mouth and sometimes a headache when she awakens.     She has some sleepines in the evenings.  EPWORTH SLEEPINESS SCALE  On a scale of 0 - 3 what is the chance of dozing:  Sitting and Reading:   0 Watching TV:    3 Sitting inactive in a public place: 0 Passenger in car for one hour: 2 Lying down to rest in the afternoon: 2 Sitting and talking to someone: 0 Sitting quietly after lunch:  0 In a car, stopped in traffic:  0  Total (out of 24):    7/24  She  notes some pulsating more than numbness in her feet and saw me late 2020.Marland Kitchen  This has not worsened any since I saw her last year.  NCV/EMG in 2021 was normal.      She has Menieres disease, fairly well controlled though sh also has tinnitus sometimes affecting sleep onset.  REVIEW OF SYSTEMS: Constitutional: No fevers, chills, sweats, or change in appetite Eyes: No visual changes, double vision, eye pain Ear, nose and throat: No hearing loss, ear pain, nasal congestion, sore throat Cardiovascular: No chest pain, palpitations Respiratory:  No shortness of breath at rest or with exertion.   No wheezes GastrointestinaI: No nausea, vomiting, diarrhea, abdominal pain, fecal incontinence Genitourinary:  No dysuria, urinary retention or frequency.  No nocturia. Musculoskeletal:  No neck pain, back pain Integumentary: No rash, pruritus, skin lesions Neurological: as above Psychiatric: No depression at this time.  No anxiety Endocrine: No palpitations, diaphoresis, change in appetite, change in weigh or increased thirst Hematologic/Lymphatic:  No anemia, purpura, petechiae. Allergic/Immunologic: No itchy/runny eyes, nasal congestion, recent allergic reactions, rashes  ALLERGIES: Allergies  Allergen Reactions   Tums [Calcium Carbonate Antacid] Hives    Burning sensation in mouth, rash  on torso   Cephalexin     itching   Penicillins     itching   Sulfamethoxazole-Trimethoprim     hives    HOME MEDICATIONS:  Current Outpatient Medications:    albuterol (VENTOLIN HFA) 108 (90 Base) MCG/ACT inhaler, Inhale 2 puffs into the lungs every 6 (six) hours as needed., Disp: 8.5 g, Rfl: 1   Cholecalciferol (VITAMIN D3 PO), Take 1,000 Units by mouth daily., Disp: , Rfl:    Cyanocobalamin (B-12) 1000 MCG TABS, Take by mouth daily., Disp: , Rfl:    levocetirizine (XYZAL) 5 MG tablet, Take 1 tablet (5 mg total) by mouth every evening., Disp: 30 tablet, Rfl: 5   LORazepam (ATIVAN) 0.5 MG tablet, Take  0.5-1 tablets (0.25-0.5 mg total) by mouth every 8 (eight) hours as needed for anxiety., Disp: 10 tablet, Rfl: 0   metoprolol succinate (TOPROL-XL) 100 MG 24 hr tablet, Take with or immediately following a meal., Disp: 90 tablet, Rfl: 1   omeprazole (PRILOSEC) 20 MG capsule, Take 20 mg by mouth daily., Disp: , Rfl:    SYNTHROID 75 MCG tablet, Take 1 tablet (75 mcg total) by mouth daily before breakfast., Disp: 90 tablet, Rfl: 1  PAST MEDICAL HISTORY: Past Medical History:  Diagnosis Date   Anxiety    Asthma    exercise induced asthma    Essential hypertension    GERD (gastroesophageal reflux disease)    Heart murmur    mitral valve prolapse   History of dermatitis    History of migraine headaches yrs ago, none recent   Hyperlipemia 05/24/2016   Hypothyroidism    Meniere's disease    Palpitations    Rare PVCs by Holter monitor April 2011    Sleep apnea    Reportedly mild, no cpap needed   Tingling    both legs saw dr Epimenio Foot 04-23-2020 no cause found   Vitamin D insufficiency 05/24/2016    PAST SURGICAL HISTORY: Past Surgical History:  Procedure Laterality Date   APPENDECTOMY  as child   BREAST LUMPECTOMY  yrs ago   Benign   DILATATION & CURETTAGE/HYSTEROSCOPY WITH MYOSURE N/A 05/07/2020   Procedure: DILATATION & CURETTAGE/HYSTEROSCOPY WITH MYOSURE/POLYP REMOVAL;  Surgeon: Romualdo Bolk, MD;  Location: [facility] ;  Service: Gynecology;  Laterality: N/A;  polyp removal    FAMILY HISTORY: Family History  Problem Relation Age of Onset   Melanoma Father 46   Stroke Mother    Asthma Mother    Stroke Maternal Grandfather    Asthma Brother    Deep vein thrombosis Brother 45       had back surgery with post op complications of blood clots which were fatal   Thyroid disease Neg Hx     SOCIAL HISTORY:  Social History   Socioeconomic History   Marital status: Married    Spouse name: [name]   Number of children: 0   Years of education: 14   Highest  education level: Not on file  Occupational History   Occupation: Development worker, international aid: [employer]  Tobacco Use   Smoking status: Never   Smokeless tobacco: Never  Vaping Use   Vaping Use: Never used  Substance and Sexual Activity   Alcohol use: No   Drug use: No   Sexual activity: Not Currently  Other Topics Concern   Not on file  Social History Narrative   Work or School: retired - used to work for [employer]  Home Situation: lives with husband      Spiritual Beliefs: Christian      Lifestyle: no regular exercise; diet is healthy      Right handed    No caffeine use:    Social Determinants of Corporate investment banker Strain: Low Risk    Difficulty of Paying Living Expenses: Not hard at all  Food Insecurity: No Food Insecurity   Worried About Programme researcher, broadcasting/film/video in the Last Year: Never true   Ran Out of Food in the Last Year: Never true  Transportation Needs: No Transportation Needs   Lack of Transportation (Medical): No   Lack of Transportation (Non-Medical): No  Physical Activity: Inactive   Days of Exercise per Week: 0 days   Minutes of Exercise per Session: 0 min  Stress: No Stress Concern Present   Feeling of Stress : Not at all  Social Connections: Socially Integrated   Frequency of Communication with Friends and Family: More than three times a week   Frequency of Social Gatherings with Friends and Family: More than three times a week   Attends Religious Services: More than 4 times per year   Active Member of [employer] or Organizations: Yes   Attends Banker Meetings: 1 to 4 times per year   Marital Status: Married  Catering manager Violence: Not At Risk   Fear of Current or Ex-Partner: No   Emotionally Abused: No   Physically Abused: No   Sexually Abused: No     PHYSICAL EXAM  Vitals:   07/24/21 1023  BP: (!) 153/76  Pulse: (!) 59  Weight: 138 lb (62.6 kg)  Height: 5\' 7"  (1.702 m)    Body mass index is 21.61  kg/m.   General: The patient is well-developed and well-nourished and in no acute distress.  Pharynx shows low lying soft palate, Mallampati 3.    HEENT:  Head is [place]/AT.  Sclera are anicteric.  Funduscopic exam shows normal optic discs and retinal vessels.  Neck: No carotid bruits are noted.  The neck is nontender.  Cardiovascular: The heart has a regular rate and rhythm with a normal S1 and S2. There were no murmurs, gallops or rubs.    Skin: Extremities are without rash or  edema.  Musculoskeletal:  Back is nontender  Neurologic Exam  Mental status: The patient is alert and oriented x 3 at the time of the examination. The patient has apparent normal recent and remote memory, with an apparently normal attention span and concentration ability.   Speech is normal.  Cranial nerves: Extraocular movements are full. Pupils are equal, round, and reactive to light and accomodation.  Visual fields are full.  Facial symmetry is present. There is good facial sensation to soft touch bilaterally.Facial strength is normal.  Trapezius and sternocleidomastoid strength is normal. No dysarthria is noted.  The tongue is midline, and the patient has symmetric elevation of the soft palate. No obvious hearing deficits are noted.  Motor:  Muscle bulk is normal.   Tone is normal. Strength is  5 / 5 in all 4 extremities.   Sensory: Sensory testing is intact to pinprick, soft touch and vibration sensation in all 4 extremities.  Coordination: Cerebellar testing reveals good finger-nose-finger and heel-to-shin bilaterally.  Gait and station: Station is normal.   Gait is mildly wide.  Tandem gait is moderately wide.  Romberg is negative.   Reflexes: Deep tendon reflexes are symmetric and normal in the arms but 3+ at the  knees with crossed abductor responses and increased at ankles without clonus.   Plantar responses are flexor.    DIAGNOSTIC DATA (LABS, IMAGING, TESTING) - I reviewed patient records, labs,  notes, testing and imaging myself where available.  Lab Results  Component Value Date   WBC 6.2 02/28/2021   HGB 13.8 02/28/2021   HCT 40.9 02/28/2021   MCV 91.6 02/28/2021   PLT 265.0 02/28/2021      Component Value Date/Time   NA 141 02/28/2021 1349   K 4.5 02/28/2021 1349   CL 107 02/28/2021 1349   CO2 28 02/28/2021 1349   GLUCOSE 98 02/28/2021 1349   BUN 16 02/28/2021 1349   CREATININE 0.83 02/28/2021 1349   CALCIUM 8.9 02/28/2021 1349   PROT 6.6 02/28/2021 1349   ALBUMIN 4.2 02/28/2021 1349   AST 15 02/28/2021 1349   ALT 7 02/28/2021 1349   ALKPHOS 81 02/28/2021 1349   BILITOT 0.3 02/28/2021 1349   GFRNONAA 75.80 10/13/2010 0812   GFRAA 95 09/25/2008 0900   Lab Results  Component Value Date   CHOL 179 01/02/2021   HDL 50.30 01/02/2021   LDLCALC 101 (H) 01/02/2021   TRIG 140.0 01/02/2021   CHOLHDL 4 01/02/2021   Lab Results  Component Value Date   HGBA1C 5.6 10/04/2018   Lab Results  Component Value Date   VITAMINB12 315 05/16/2021   Lab Results  Component Value Date   TSH 2.00 01/02/2021       ASSESSMENT AND PLAN OSA (obstructive sleep apnea) - Plan: Nocturnal polysomnography  Palpitations - Plan: Nocturnal polysomnography  Numbness  Abnormal reflex  Meniere's disease of left ear  B12 deficiency  She has a long history of snoring and more recently has had palpitations.  She did have some weakness OSA in the past.  We will check a polysomnogram to further evaluate.  If she has moderate or severe OSA would recommend treatment with CPAP.  Since she does not have excessive daytime sleepiness, mild OSA would not need to be treated. Her sensory symptoms are stable.  NCV/EMG last year did not show any evidence of polyneuropathy. Return as needed based on the results of the study.  I do like to see her back in 3 months if she does have OSA and requires CPAP or AutoPap.  40-minute office visit with the majority of the time spent face-to-face for  history and physical, discussion/counseling and decision-making.  Additional time with record review and documentation.   Francille Wittmann A. Epimenio FootSater, MD, [facility],FAAN 07/24/2021, 1:20 PM Certified in Neurology, Clinical Neurophysiology, Sleep Medicine and Neuroimaging  [facility] [address], [address] [place], [place] [address] [phone]

## 2021-07-28 ENCOUNTER — Institutional Professional Consult (permissible substitution): Payer: Medicare PPO | Admitting: Neurology

## 2021-08-06 ENCOUNTER — Institutional Professional Consult (permissible substitution): Payer: Medicare PPO | Admitting: Neurology

## 2021-08-22 ENCOUNTER — Other Ambulatory Visit: Payer: Self-pay

## 2021-08-22 ENCOUNTER — Other Ambulatory Visit (INDEPENDENT_AMBULATORY_CARE_PROVIDER_SITE_OTHER): Payer: Medicare PPO

## 2021-08-22 DIAGNOSIS — E538 Deficiency of other specified B group vitamins: Secondary | ICD-10-CM | POA: Diagnosis not present

## 2021-08-22 DIAGNOSIS — H0012 Chalazion right lower eyelid: Secondary | ICD-10-CM | POA: Diagnosis not present

## 2021-08-25 LAB — METHYLMALONIC ACID, SERUM: Methylmalonic Acid, Quant: 166 nmol/L (ref 87–318)

## 2021-08-25 LAB — HOMOCYSTEINE: Homocysteine: 10.6 umol/L — ABNORMAL HIGH (ref <10.4)

## 2021-08-26 ENCOUNTER — Other Ambulatory Visit: Payer: Self-pay | Admitting: Neurology

## 2021-08-26 ENCOUNTER — Telehealth: Payer: Self-pay

## 2021-08-26 DIAGNOSIS — G4733 Obstructive sleep apnea (adult) (pediatric): Secondary | ICD-10-CM

## 2021-08-26 DIAGNOSIS — R002 Palpitations: Secondary | ICD-10-CM

## 2021-08-26 NOTE — Telephone Encounter (Signed)
Pt's insurance has denied in lab sleep study request. [employer] states that patient meets criteria for HST. Would you like to order a HST for this patient?

## 2021-09-10 ENCOUNTER — Ambulatory Visit (INDEPENDENT_AMBULATORY_CARE_PROVIDER_SITE_OTHER): Payer: Medicare PPO | Admitting: Neurology

## 2021-09-10 DIAGNOSIS — G4733 Obstructive sleep apnea (adult) (pediatric): Secondary | ICD-10-CM

## 2021-09-10 DIAGNOSIS — R002 Palpitations: Secondary | ICD-10-CM

## 2021-09-15 NOTE — Progress Notes (Signed)
**Note Tracey-Identified via Obfuscation**    [facility]  HOME SLEEP STUDY  STUDY DATE: 09/10/2021 PATIENT NAME: Tracey Giles DOB: 13-Dec-1950 MRN: [id number]  ORDERING CLINICIAN: Richard A. Epimenio Foot, MD, PhD REFERRING CLINICIAN: Richard A. Sater, MD. PhD   CLINICAL INFORMATION: 70 year old woman with OSA  FINDINGS:  Total Record Time: 8 hours 54 minutes Total Sleep Time:  6 hours 10 minutes  Percent REM:   22.3%   Calculated pAHI:  16.9/hr  REM pAHI:    12.4/hr  NREM pAHI: 18.3/hr  Pulse Mean:    57 bpm pulse Range (44 to 87 BPM)    Oxygen Sat% Mean: 95%  O2Sat Range (90-99%)  O2Sat <88%: 0 minutes    IMPRESSION:  Moderate obstructive sleep apnea (AHI= 16.9/h) No nocturnal hypoxemia   RECOMMENDATION: Recommend AutoPap 5-20 cm H2O with a heated humidifier and download in 30 and 90 days Follow-up with Dr. Epimenio Foot   INTERPRETING PHYSICIAN:   Pearletha Furl. Epimenio Foot, MD, PhD, [facility] Certified in Neurology, Clinical Neurophysiology, Sleep Medicine, Pain Medicine and Neuroimaging  [facility] [address] [place], [place] [address] [phone]

## 2021-09-16 ENCOUNTER — Other Ambulatory Visit: Payer: Self-pay | Admitting: Family Medicine

## 2021-09-16 DIAGNOSIS — E538 Deficiency of other specified B group vitamins: Secondary | ICD-10-CM

## 2021-09-17 ENCOUNTER — Encounter: Payer: Self-pay | Admitting: *Deleted

## 2021-09-17 ENCOUNTER — Other Ambulatory Visit: Payer: Self-pay | Admitting: *Deleted

## 2021-09-17 ENCOUNTER — Telehealth: Payer: Self-pay | Admitting: *Deleted

## 2021-09-17 DIAGNOSIS — G4733 Obstructive sleep apnea (adult) (pediatric): Secondary | ICD-10-CM

## 2021-09-17 NOTE — Telephone Encounter (Signed)
-----   Message from Asa Lente, MD sent at 09/17/2021  8:36 AM EDT ----- Regarding: HST Please let her know that the home sleep study showed moderate OSA (AHI of 16.9 per hour)  We can set up AutoPap 5-20 cm H2O with heated humidifier and appropriate follow-up

## 2021-09-17 NOTE — Telephone Encounter (Signed)
I called pt. I advised pt that Dr. Epimenio Foot reviewed their sleep study results and found that pt has moderate sleep apnea. Dr. Epimenio Foot recommends that pt start CPAP. I reviewed PAP compliance expectations with the pt. Pt is agreeable to starting a CPAP. I advised pt that an order will be sent to a DME, [facility], and [employer] will call the pt within about one week after they file with the pt's insurance. [facility] will show the pt how to use the machine, fit for masks, and troubleshoot the CPAP if needed. A follow up appt was made for insurance purposes with Dr. Epimenio Foot on 12/15/20 at 1:30p. Pt verbalized understanding to arrive 15 minutes early and bring their CPAP. A letter with all of this information in it will be mailed to the pt as a reminder. I verified with the pt that the address we have on file is correct. Pt verbalized understanding of results. Pt had no questions at this time but was encouraged to call back if questions arise. I have sent the order to [employer] and have received confirmation that they have received the order.

## 2021-10-17 DIAGNOSIS — H9042 Sensorineural hearing loss, unilateral, left ear, with unrestricted hearing on the contralateral side: Secondary | ICD-10-CM | POA: Diagnosis not present

## 2021-11-01 ENCOUNTER — Other Ambulatory Visit: Payer: Self-pay | Admitting: Family Medicine

## 2021-11-18 ENCOUNTER — Encounter: Payer: Self-pay | Admitting: Family Medicine

## 2021-11-21 ENCOUNTER — Ambulatory Visit (INDEPENDENT_AMBULATORY_CARE_PROVIDER_SITE_OTHER): Payer: Medicare PPO

## 2021-11-21 ENCOUNTER — Encounter: Payer: Self-pay | Admitting: Family Medicine

## 2021-11-21 ENCOUNTER — Other Ambulatory Visit: Payer: Self-pay

## 2021-11-21 ENCOUNTER — Ambulatory Visit: Payer: Medicare PPO | Admitting: Family Medicine

## 2021-11-21 ENCOUNTER — Ambulatory Visit (INDEPENDENT_AMBULATORY_CARE_PROVIDER_SITE_OTHER): Payer: Medicare PPO | Admitting: Family Medicine

## 2021-11-21 VITALS — BP 160/90 | HR 58 | Temp 97.8°F | Ht 67.0 in | Wt 140.0 lb

## 2021-11-21 DIAGNOSIS — M25852 Other specified joint disorders, left hip: Secondary | ICD-10-CM | POA: Diagnosis not present

## 2021-11-21 DIAGNOSIS — M25552 Pain in left hip: Secondary | ICD-10-CM

## 2021-11-21 NOTE — Progress Notes (Signed)
Tracey Giles [facility] DOB: 07-Apr-1951 Encounter date: 11/21/2021  This is a 70 y.o. female who presents with Chief Complaint  Patient presents with   Hip Pain    Patient complains of left lateral hip pain x1 month, no known injury       History of present illness: Left hip hurting every day. Week before thanksgiving was moving things around and isn't sure if she did something. Seemed to get better for a bit, but then worsened. Ok with walking, but hurts her with bending, standing up. Most of pain is on lateral hip; sometimes feels it go across back. No groin pain. Has tried heating pad, es tylenol, stretches. Helps a little while, but not resolving this. Better with sitting, laying down.   Right hip feels ok. No bruising, skin changes. No knee pain.   Pain has been bad - up to 10 on pain scale. Today is best day it has been - rates as 8.    Allergies  Allergen Reactions   Tums [Calcium Carbonate Antacid] Hives    Burning sensation in mouth, rash on torso   Cephalexin     itching   Penicillins     itching   Sulfamethoxazole-Trimethoprim     hives   Current Meds  Medication Sig   albuterol (VENTOLIN HFA) 108 (90 Base) MCG/ACT inhaler Inhale 2 puffs into the lungs every 6 (six) hours as needed.   Cholecalciferol (VITAMIN D3 PO) Take 1,000 Units by mouth daily.   Cyanocobalamin (B-12) 1000 MCG TABS Take by mouth daily.   levocetirizine (XYZAL) 5 MG tablet Take 1 tablet (5 mg total) by mouth every evening.   LORazepam (ATIVAN) 0.5 MG tablet Take 0.5-1 tablets (0.25-0.5 mg total) by mouth every 8 (eight) hours as needed for anxiety.   metoprolol succinate (TOPROL-XL) 100 MG 24 hr tablet TAKE 1 TABLET BY MOUTH WITH OR IMMEDIATLY FOLLOWING A MEAL   omeprazole (PRILOSEC) 20 MG capsule Take 20 mg by mouth daily.   SYNTHROID 75 MCG tablet TAKE 1 TABLET BY MOUTH BEFORE BREAKFAST    Review of Systems  Constitutional:  Negative for chills, fatigue and fever.  Respiratory:  Negative  for cough, chest tightness, shortness of breath and wheezing.   Cardiovascular:  Negative for chest pain, palpitations and leg swelling.  Musculoskeletal:  Positive for arthralgias (left hip).   Objective:  BP (!) 160/90 Comment: repeated by Rachel--jaf   Pulse (!) 58    Temp 97.8 F (36.6 C) (Oral)    Ht 5\' 7"  (1.702 m)    Wt 140 lb (63.5 kg)    SpO2 99%    BMI 21.93 kg/m   Weight: 140 lb (63.5 kg)   BP Readings from Last 3 Encounters:  11/21/21 (!) 160/90  07/24/21 (!) 153/76  06/26/21 134/78   Wt Readings from Last 3 Encounters:  11/21/21 140 lb (63.5 kg)  07/24/21 138 lb (62.6 kg)  06/26/21 138 lb (62.6 kg)    Physical Exam Constitutional:      General: She is not in acute distress.    Appearance: She is well-developed.  Cardiovascular:     Rate and Rhythm: Normal rate and regular rhythm.     Heart sounds: Normal heart sounds. No murmur heard.   No friction rub.  Pulmonary:     Effort: Pulmonary effort is normal. No respiratory distress.     Breath sounds: Normal breath sounds. No wheezing or rales.  Musculoskeletal:     Right lower leg: No edema.  Left lower leg: No edema.     Comments: Ain in left hip with torso rotation left and right, fairly symmetric range of motion bilateral hips and flexion, extension without pain. Mild discomfort with external rotation of left hip. No signfiicant pain with internal rotation. Mild tenderness SI left with some muscle spasm, mild sacral tenderness. Mild left trochanteric bursa tenderness. Tenderness along iliac crest left. This reproduces area of pain that patient feels in left hip.   Neurological:     Mental Status: She is alert and oriented to person, place, and time.  Psychiatric:        Behavior: Behavior normal.    Assessment/Plan  1. Left hip pain Start with xray; if normal would suggest PT to work on some of muscle spasm, stretching of hip/supporting structures.  - DG Hip Unilat W OR W/O Pelvis 2-3 Views Left;  Future   Return for pending xray.    [name], MD

## 2021-11-25 ENCOUNTER — Other Ambulatory Visit (INDEPENDENT_AMBULATORY_CARE_PROVIDER_SITE_OTHER): Payer: Medicare PPO

## 2021-11-25 ENCOUNTER — Ambulatory Visit (INDEPENDENT_AMBULATORY_CARE_PROVIDER_SITE_OTHER): Payer: Medicare PPO

## 2021-11-25 VITALS — BP 110/62 | HR 69 | Temp 98.2°F | Ht 67.0 in | Wt 140.4 lb

## 2021-11-25 DIAGNOSIS — Z0000 Encounter for general adult medical examination without abnormal findings: Secondary | ICD-10-CM | POA: Diagnosis not present

## 2021-11-25 DIAGNOSIS — E538 Deficiency of other specified B group vitamins: Secondary | ICD-10-CM | POA: Diagnosis not present

## 2021-11-25 LAB — VITAMIN B12: Vitamin B-12: 826 pg/mL (ref 211–911)

## 2021-11-25 LAB — FOLATE: Folate: >23.4 ng/mL (ref >5.9)

## 2021-11-25 NOTE — Progress Notes (Signed)
This visit occurred during the SARS-CoV-2 public health emergency.  Safety protocols were in place, including screening questions prior to the visit, additional usage of staff PPE, and extensive cleaning of exam room while observing appropriate contact time as indicated for disinfecting solutions.  Subjective:   Tracey Giles is a 70 y.o. female who presents for Medicare Annual (Subsequent) preventive examination.  Review of Systems     Cardiac Risk Factors include: advanced age (>61men, >76 women);dyslipidemia     Objective:    Today's Vitals   11/25/21 1200 11/25/21 1204  BP: 110/62   Pulse: 69   Temp: 98.2 F (36.8 C)   TempSrc: Oral   SpO2: 99%   Weight: 140 lb 6.4 oz (63.7 kg)   Height: 5\' 7"  (1.702 m)   PainSc:  5    Body mass index is 21.99 kg/m.  Advanced Directives 11/25/2021 11/19/2020 05/07/2020 10/04/2018  Does Patient Have a Medical Advance Directive? No No No No  Would patient like information on creating a medical advance directive? No - Patient declined No - Patient declined No - Patient declined -    Current Medications (verified) Outpatient Encounter Medications as of 11/25/2021  Medication Sig   albuterol (VENTOLIN HFA) 108 (90 Base) MCG/ACT inhaler Inhale 2 puffs into the lungs every 6 (six) hours as needed.   Cholecalciferol (VITAMIN D3 PO) Take 1,000 Units by mouth daily.   Cyanocobalamin (B-12) 1000 MCG TABS Take by mouth daily.   levocetirizine (XYZAL) 5 MG tablet Take 1 tablet (5 mg total) by mouth every evening.   LORazepam (ATIVAN) 0.5 MG tablet Take 0.5-1 tablets (0.25-0.5 mg total) by mouth every 8 (eight) hours as needed for anxiety.   metoprolol succinate (TOPROL-XL) 100 MG 24 hr tablet TAKE 1 TABLET BY MOUTH WITH OR IMMEDIATLY FOLLOWING A MEAL   omeprazole (PRILOSEC) 20 MG capsule Take 20 mg by mouth daily.   SYNTHROID 75 MCG tablet TAKE 1 TABLET BY MOUTH BEFORE BREAKFAST   No facility-administered encounter medications on file as of  11/25/2021.    Allergies (verified) Tums [calcium carbonate antacid], Cephalexin, Penicillins, and Sulfamethoxazole-trimethoprim   History: Past Medical History:  Diagnosis Date   Anxiety    Asthma    exercise induced asthma    Essential hypertension    GERD (gastroesophageal reflux disease)    Heart murmur    mitral valve prolapse   History of dermatitis    History of migraine headaches yrs ago, none recent   Hyperlipemia 05/24/2016   Hypothyroidism    Meniere's disease    Palpitations    Rare PVCs by Holter monitor April 2011    Sleep apnea    Reportedly mild, no cpap needed   Tingling    both legs saw dr May 2011 04-23-2020 no cause found   Vitamin D insufficiency 05/24/2016   Past Surgical History:  Procedure Laterality Date   APPENDECTOMY  as child   BREAST LUMPECTOMY  yrs ago   Benign   DILATATION & CURETTAGE/HYSTEROSCOPY WITH MYOSURE N/A 05/07/2020   Procedure: DILATATION & CURETTAGE/HYSTEROSCOPY WITH MYOSURE/POLYP REMOVAL;  Surgeon: 07/07/2020, MD;  Location: [facility] ;  Service: Gynecology;  Laterality: N/A;  polyp removal   Family History  Problem Relation Age of Onset   Melanoma Father 71   Stroke Mother    Asthma Mother    Stroke Maternal Grandfather    Asthma Brother    Deep vein thrombosis Brother 45       had back surgery  with post op complications of blood clots which were fatal   Thyroid disease Neg Hx    Social History   Socioeconomic History   Marital status: Married    Spouse name: [name]   Number of children: 0   Years of education: 14   Highest education level: Associate degree: occupational, Scientist, product/process development, or vocational program  Occupational History   Occupation: Development worker, international aid: [employer]  Tobacco Use   Smoking status: Never   Smokeless tobacco: Never  Vaping Use   Vaping Use: Never used  Substance and Sexual Activity   Alcohol use: No   Drug use: No   Sexual activity: Not Currently  Other Topics Concern    Not on file  Social History Narrative   Work or School: retired - used to work for [employer] Situation: lives with husband      Spiritual Beliefs: Christian      Lifestyle: no regular exercise; diet is healthy      Right handed    No caffeine use:    Social Determinants of Corporate investment banker Strain: Low Risk    Difficulty of Paying Living Expenses: Not hard at all  Food Insecurity: No Food Insecurity   Worried About Programme researcher, broadcasting/film/video in the Last Year: Never true   Barista in the Last Year: Never true  Transportation Needs: No Transportation Needs   Lack of Transportation (Medical): No   Lack of Transportation (Non-Medical): No  Physical Activity: Insufficiently Active   Days of Exercise per Week: 3 days   Minutes of Exercise per Session: 20 min  Stress: No Stress Concern Present   Feeling of Stress : Not at all  Social Connections: Socially Integrated   Frequency of Communication with Friends and Family: More than three times a week   Frequency of Social Gatherings with Friends and Family: Patient refused   Attends Religious Services: More than 4 times per year   Active Member of [employer] or Organizations: Yes   Attends Engineer, structural: More than 4 times per year   Marital Status: Married    Tobacco Counseling Counseling given: Not Answered   Clinical Intake:  Pre-visit preparation completed: Yes  Pain : 0-10 Pain Score: 5  Pain Type: Acute pain Pain Location: Hip Pain Orientation: Left Pain Descriptors / Indicators: Nagging Pain Onset: More than a month ago Pain Frequency: Intermittent     Nutritional Status: BMI of 19-24  Normal Nutritional Risks: None Diabetes: No  How often do you need to have someone help you when you read instructions, pamphlets, or other written materials from your doctor or pharmacy?: 1 - Never What is the last grade level you completed in school?: 14 yrs  Diabetic?  no  Interpreter Needed?: No  Information entered by :: NAllen LPN   Activities of Daily Living In your present state of health, do you have any difficulty performing the following activities: 11/25/2021  Hearing? N  Vision? N  Difficulty concentrating or making decisions? N  Walking or climbing stairs? N  Dressing or bathing? N  Doing errands, shopping? N  Preparing Food and eating ? N  Using the Toilet? N  In the past six months, have you accidently leaked urine? N  Do you have problems with loss of bowel control? N  Managing your Medications? N  Managing your Finances? N  Housekeeping or managing your Housekeeping? N  Some  recent data might be hidden    Patient Care Team: Wynn Banker, MD as PCP - General (Family Medicine) Jonelle Sidle, MD as PCP - Cardiology (Cardiology) Ermalinda Barrios, MD as Consulting Physician (Otolaryngology) Cherlyn Roberts, MD as Consulting Physician (Dermatology) Romualdo Bolk, MD as Consulting Physician (Obstetrics and Gynecology)  Indicate any recent Medical Services you may have received from other than [facility] providers in the past year (date may be approximate).     Assessment:   This is a routine wellness examination for Laney.  Hearing/Vision screen Vision Screening - Comments:: Regular eye exams, [facility]  Dietary issues and exercise activities discussed: Current Exercise Habits: Home exercise routine, Type of exercise: walking, Time (Minutes): 20, Frequency (Times/Week): 3, Weekly Exercise (Minutes/Week): 60   Goals Addressed             This Visit's Progress    Patient Stated       11/25/2021, exercise more       Depression Screen PHQ 2/9 Scores 11/25/2021 11/21/2021 11/19/2020 10/10/2019 10/04/2018 09/30/2017 12/11/2016  PHQ - 2 Score 0 0 0 0 0 0 0  PHQ- 9 Score - 1 - - - - -    Fall Risk Fall Risk  11/25/2021 11/19/2021 11/18/2021 11/18/2021 11/18/2021  Falls in the past year? 0 0 0 0 0   Number falls in past yr: - - - - -  Injury with Fall? - - - - -  Comment - - - - -  Risk for fall due to : Medication side effect - - - -  Follow up Falls evaluation completed;Education provided;Falls prevention discussed - - - -    FALL RISK PREVENTION PERTAINING TO THE HOME:  Any stairs in or around the home? Yes  If so, are there any without handrails? No  Home free of loose throw rugs in walkways, pet beds, electrical cords, etc? Yes  Adequate lighting in your home to reduce risk of falls? Yes   ASSISTIVE DEVICES UTILIZED TO PREVENT FALLS:  Life alert? No  Use of a cane, walker or w/c? No  Grab bars in the bathroom? Yes  Shower chair or bench in shower? Yes  Elevated toilet seat or a handicapped toilet? Yes   TIMED UP AND GO:  Was the test performed? No .    Gait steady and fast without use of assistive device  Cognitive Function: MMSE - Mini Mental State Exam 10/04/2018  Not completed: (No Data)     6CIT Screen 11/25/2021 11/19/2020  What Year? 0 points 0 points  What month? 0 points 0 points  What time? 0 points -  Count back from 20 0 points 0 points  Months in reverse 0 points 0 points  Repeat phrase 2 points 0 points  Total Score 2 -    Immunizations Immunization History  Administered Date(s) Administered   Fluad Quad(high Dose 65+) 10/05/2019, 09/24/2020   Influenza Split 09/21/2011, 09/05/2012   Influenza Whole 10/07/2001, 09/23/2007, 09/06/2008, 10/10/2009   Influenza, High Dose Seasonal PF 09/15/2018   Influenza,inj,Quad PF,6+ Mos 10/09/2013, 09/06/2014, 08/14/2016   Influenza-Unspecified 09/14/2017, 09/19/2018, 10/06/2021   Moderna Sars-Covid-2 Vaccination 01/19/2020, 02/16/2020, 10/15/2020   PFIZER Comirnaty(Gray Top)Covid-19 Tri-Sucrose Vaccine 07/22/2021   Pneumococcal Conjugate-13 12/11/2016   Pneumococcal Polysaccharide-23 12/30/2017   Td 02/04/1998, 09/23/2007, 09/30/2017   Zoster, Live 06/03/2012    TDAP status: Up to date  Flu  Vaccine status: Up to date  Pneumococcal vaccine status: Up to date  Covid-19 vaccine status: Completed  vaccines  Qualifies for Shingles Vaccine? Yes   Zostavax completed No   Shingrix Completed?: No.    Education has been provided regarding the importance of this vaccine. Patient has been advised to call insurance company to determine out of pocket expense if they have not yet received this vaccine. Advised may also receive vaccine at local pharmacy or Health Dept. Verbalized acceptance and understanding.  Screening Tests Health Maintenance  Topic Date Due   COVID-19 Vaccine (5 - Booster for Moderna series) 12/07/2021 (Originally 09/16/2021)   Zoster Vaccines- Shingrix (1 of 2) 01/22/2022 (Originally 08/26/1970)   MAMMOGRAM  01/27/2023   DEXA SCAN  01/27/2023   Fecal DNA (Cologuard)  12/12/2023   TETANUS/TDAP  10/01/2027   Pneumonia Vaccine 63+ Years old  Completed   INFLUENZA VACCINE  Completed   Hepatitis C Screening  Addressed   HPV VACCINES  Aged Out    Health Maintenance  There are no preventive care reminders to display for this patient.  Colorectal cancer screening: Type of screening: Cologuard. Completed 12/11/2020. Repeat every 3 years  Mammogram status: Completed 01/27/2021. Repeat every year  Bone Density status: Completed 01/27/2021.   Lung Cancer Screening: (Low Dose CT Chest recommended if Age 4-80 years, 30 pack-year currently smoking OR have quit w/in 15years.) does not qualify.   Lung Cancer Screening Referral: no  Additional Screening:  Hepatitis C Screening: does qualify; Completed 12/11/2016  Vision Screening: Recommended annual ophthalmology exams for early detection of glaucoma and other disorders of the eye. Is the patient up to date with their annual eye exam?  Yes  Who is the provider or what is the name of the office in which the patient attends annual eye exams? [facility] If pt is not established with a provider, would they like to be referred  to a provider to establish care? No .   Dental Screening: Recommended annual dental exams for proper oral hygiene  Community Resource Referral / Chronic Care Management: CRR required this visit?  No   CCM required this visit?  No      Plan:     I have personally reviewed and noted the following in the patients chart:   Medical and social history Use of alcohol, tobacco or illicit drugs  Current medications and supplements including opioid prescriptions.  Functional ability and status Nutritional status Physical activity Advanced directives List of other physicians Hospitalizations, surgeries, and ER visits in previous 12 months Vitals Screenings to include cognitive, depression, and falls Referrals and appointments  In addition, I have reviewed and discussed with patient certain preventive protocols, quality metrics, and best practice recommendations. A written personalized care plan for preventive services as well as general preventive health recommendations were provided to patient.     Barb Merino, LPN   [id number]   Nurse Notes: none

## 2021-11-25 NOTE — Patient Instructions (Signed)
Ms. Leiner , Thank you for taking time to come for your Medicare Wellness Visit. I appreciate your ongoing commitment to your health goals. Please review the following plan we discussed and let me know if I can assist you in the future.   Screening recommendations/referrals: Colonoscopy: cologuard 12/11/2020, due 12/12/2023 Mammogram: completed 01/27/2021 Bone Density: completed 01/27/2021 Recommended yearly ophthalmology/optometry visit for glaucoma screening and checkup Recommended yearly dental visit for hygiene and checkup  Vaccinations: Influenza vaccine: completed 10/06/2021 Pneumococcal vaccine: completed 12/30/2017 Tdap vaccine: completed 09/30/2017, due 10/01/2027 Shingles vaccine: discussed   Covid-19: 07/22/2021, 10/15/2020, 02/16/2020, 01/19/2020  Advanced directives: Advance directive discussed with you today. Even though you declined this today please call our office should you change your mind and we can give you the proper paperwork for you to fill out.  Conditions/risks identified: none  Next appointment: Follow up in one year for your annual wellness visit    Preventive Care 65 Years and Older, Female Preventive care refers to lifestyle choices and visits with your health care provider that can promote health and wellness. What does preventive care include? A yearly physical exam. This is also called an annual well check. Dental exams once or twice a year. Routine eye exams. Ask your health care provider how often you should have your eyes checked. Personal lifestyle choices, including: Daily care of your teeth and gums. Regular physical activity. Eating a healthy diet. Avoiding tobacco and drug use. Limiting alcohol use. Practicing safe sex. Taking low-dose aspirin every day. Taking vitamin and mineral supplements as recommended by your health care provider. What happens during an annual well check? The services and screenings done by your health care provider  during your annual well check will depend on your age, overall health, lifestyle risk factors, and family history of disease. Counseling  Your health care provider may ask you questions about your: Alcohol use. Tobacco use. Drug use. Emotional well-being. Home and relationship well-being. Sexual activity. Eating habits. History of falls. Memory and ability to understand (cognition). Work and work Astronomer. Reproductive health. Screening  You may have the following tests or measurements: Height, weight, and BMI. Blood pressure. Lipid and cholesterol levels. These may be checked every 5 years, or more frequently if you are over 8 years old. Skin check. Lung cancer screening. You may have this screening every year starting at age 59 if you have a 30-pack-year history of smoking and currently smoke or have quit within the past 15 years. Fecal occult blood test (FOBT) of the stool. You may have this test every year starting at age 83. Flexible sigmoidoscopy or colonoscopy. You may have a sigmoidoscopy every 5 years or a colonoscopy every 10 years starting at age 80. Hepatitis C blood test. Hepatitis B blood test. Sexually transmitted disease (STD) testing. Diabetes screening. This is done by checking your blood sugar (glucose) after you have not eaten for a while (fasting). You may have this done every 1-3 years. Bone density scan. This is done to screen for osteoporosis. You may have this done starting at age 46. Mammogram. This may be done every 1-2 years. Talk to your health care provider about how often you should have regular mammograms. Talk with your health care provider about your test results, treatment options, and if necessary, the need for more tests. Vaccines  Your health care provider may recommend certain vaccines, such as: Influenza vaccine. This is recommended every year. Tetanus, diphtheria, and acellular pertussis (Tdap, Td) vaccine. You may need a Td booster  every  10 years. Zoster vaccine. You may need this after age 59. Pneumococcal 13-valent conjugate (PCV13) vaccine. One dose is recommended after age 69. Pneumococcal polysaccharide (PPSV23) vaccine. One dose is recommended after age 34. Talk to your health care provider about which screenings and vaccines you need and how often you need them. This information is not intended to replace advice given to you by your health care provider. Make sure you discuss any questions you have with your health care provider. Document Released: 12/20/2015 Document Revised: 08/12/2016 Document Reviewed: 09/24/2015 Elsevier Interactive Patient Education  2017 [place] Prevention in the Home Falls can cause injuries. They can happen to people of all ages. There are many things you can do to make your home safe and to help prevent falls. What can I do on the outside of my home? Regularly fix the edges of walkways and driveways and fix any cracks. Remove anything that might make you trip as you walk through a door, such as a raised step or threshold. Trim any bushes or trees on the path to your home. Use bright outdoor lighting. Clear any walking paths of anything that might make someone trip, such as rocks or tools. Regularly check to see if handrails are loose or broken. Make sure that both sides of any steps have handrails. Any raised decks and porches should have guardrails on the edges. Have any leaves, snow, or ice cleared regularly. Use sand or salt on walking paths during winter. Clean up any spills in your garage right away. This includes oil or grease spills. What can I do in the bathroom? Use night lights. Install grab bars by the toilet and in the tub and shower. Do not use towel bars as grab bars. Use non-skid mats or decals in the tub or shower. If you need to sit down in the shower, use a plastic, non-slip stool. Keep the floor dry. Clean up any water that spills on the floor as soon as it  happens. Remove soap buildup in the tub or shower regularly. Attach bath mats securely with double-sided non-slip rug tape. Do not have throw rugs and other things on the floor that can make you trip. What can I do in the bedroom? Use night lights. Make sure that you have a light by your bed that is easy to reach. Do not use any sheets or blankets that are too big for your bed. They should not hang down onto the floor. Have a firm chair that has side arms. You can use this for support while you get dressed. Do not have throw rugs and other things on the floor that can make you trip. What can I do in the kitchen? Clean up any spills right away. Avoid walking on wet floors. Keep items that you use a lot in easy-to-reach places. If you need to reach something above you, use a strong step stool that has a grab bar. Keep electrical cords out of the way. Do not use floor polish or wax that makes floors slippery. If you must use wax, use non-skid floor wax. Do not have throw rugs and other things on the floor that can make you trip. What can I do with my stairs? Do not leave any items on the stairs. Make sure that there are handrails on both sides of the stairs and use them. Fix handrails that are broken or loose. Make sure that handrails are as long as the stairways. Check any carpeting to  make sure that it is firmly attached to the stairs. Fix any carpet that is loose or worn. Avoid having throw rugs at the top or bottom of the stairs. If you do have throw rugs, attach them to the floor with carpet tape. Make sure that you have a light switch at the top of the stairs and the bottom of the stairs. If you do not have them, ask someone to add them for you. What else can I do to help prevent falls? Wear shoes that: Do not have high heels. Have rubber bottoms. Are comfortable and fit you well. Are closed at the toe. Do not wear sandals. If you use a stepladder: Make sure that it is fully opened.  Do not climb a closed stepladder. Make sure that both sides of the stepladder are locked into place. Ask someone to hold it for you, if possible. Clearly mark and make sure that you can see: Any grab bars or handrails. First and last steps. Where the edge of each step is. Use tools that help you move around (mobility aids) if they are needed. These include: Canes. Walkers. Scooters. Crutches. Turn on the lights when you go into a dark area. Replace any light bulbs as soon as they burn out. Set up your furniture so you have a clear path. Avoid moving your furniture around. If any of your floors are uneven, fix them. If there are any pets around you, be aware of where they are. Review your medicines with your doctor. Some medicines can make you feel dizzy. This can increase your chance of falling. Ask your doctor what other things that you can do to help prevent falls. This information is not intended to replace advice given to you by your health care provider. Make sure you discuss any questions you have with your health care provider. Document Released: 09/19/2009 Document Revised: 04/30/2016 Document Reviewed: 12/28/2014 Elsevier Interactive Patient Education  2017 [employer].

## 2021-11-28 LAB — METHYLMALONIC ACID, SERUM: Methylmalonic Acid, Quant: 155 nmol/L (ref 87–318)

## 2021-11-28 LAB — HOMOCYSTEINE: Homocysteine: 9.4 umol/L (ref <10.4)

## 2021-12-03 ENCOUNTER — Encounter: Payer: Self-pay | Admitting: Family Medicine

## 2021-12-03 ENCOUNTER — Ambulatory Visit: Payer: Medicare PPO | Admitting: Family Medicine

## 2021-12-03 VITALS — BP 118/80 | HR 66 | Temp 97.6°F | Ht 67.0 in | Wt 140.2 lb

## 2021-12-03 DIAGNOSIS — E785 Hyperlipidemia, unspecified: Secondary | ICD-10-CM | POA: Diagnosis not present

## 2021-12-03 DIAGNOSIS — J45990 Exercise induced bronchospasm: Secondary | ICD-10-CM

## 2021-12-03 DIAGNOSIS — R030 Elevated blood-pressure reading, without diagnosis of hypertension: Secondary | ICD-10-CM

## 2021-12-03 DIAGNOSIS — R0683 Snoring: Secondary | ICD-10-CM | POA: Diagnosis not present

## 2021-12-03 DIAGNOSIS — E538 Deficiency of other specified B group vitamins: Secondary | ICD-10-CM | POA: Diagnosis not present

## 2021-12-03 DIAGNOSIS — M810 Age-related osteoporosis without current pathological fracture: Secondary | ICD-10-CM | POA: Diagnosis not present

## 2021-12-03 DIAGNOSIS — E559 Vitamin D deficiency, unspecified: Secondary | ICD-10-CM

## 2021-12-03 DIAGNOSIS — E039 Hypothyroidism, unspecified: Secondary | ICD-10-CM | POA: Diagnosis not present

## 2021-12-03 DIAGNOSIS — R002 Palpitations: Secondary | ICD-10-CM | POA: Diagnosis not present

## 2021-12-03 MED ORDER — ALBUTEROL SULFATE HFA 108 (90 BASE) MCG/ACT IN AERS: 2.0000 | INHALATION_SPRAY | Freq: Four times a day (QID) | RESPIRATORY_TRACT | 1 refills | Status: DC | PRN

## 2021-12-03 NOTE — Progress Notes (Signed)
Tracey Giles [facility] DOB: 28-Apr-1951 Encounter date: 12/03/2021  This is a 70 y.o. female who presents with Chief Complaint  Patient presents with   Results    History of present illness: Last visit was 11/21/2021.  We discussed hip pain at that time.  Blood pressure is elevated visit.  X-ray did reveal cam type morphology of left femur with femoral acetabular impingement syndrome.  Patient was referred to Ortho. She hasn't heard from them yet.   She followed up with neurology in August for snoring/palpitations with concerns that potential sleep apnea was worsening of symptoms.  I do not have any results from sleep study.states that they called her and she was told she had moderate sleep apnea. Last night was her first night with the machine. As pressure was increasing it felt like too much air.   She followed with cardiology in July for palpitations.  After thorough evaluation, palpitations were found to be PACs/PVCs, and no further intervention was suggested.  Exercise-induced asthma: if she moves around too much will get pains in the chest. If uses inhaler ahead of time then doesn't have symptoms.   GERD: does take prilosec daily; has sx if she misses a dose.   Osteoporosis: Last DEXA was done through [place].  Demonstrated osteoporosis.  This was completed 01/2021.  Repeat in 2 years. She has declined medications for this and prefers to work on weight bearing exercise, vitamin d/calcium.  Hypothyroid: Synthroid 75 mcg daily  Allergies  Allergen Reactions   Tums [Calcium Carbonate Antacid] Hives    Burning sensation in mouth, rash on torso   Cephalexin     itching   Penicillins     itching   Sulfamethoxazole-Trimethoprim     hives   Current Meds  Medication Sig   Cholecalciferol (VITAMIN D3 PO) Take 1,000 Units by mouth daily.   Cyanocobalamin (B-12) 1000 MCG TABS Take by mouth daily.   levocetirizine (XYZAL) 5 MG tablet Take 1 tablet (5 mg total) by mouth every evening.    LORazepam (ATIVAN) 0.5 MG tablet Take 0.5-1 tablets (0.25-0.5 mg total) by mouth every 8 (eight) hours as needed for anxiety.   metoprolol succinate (TOPROL-XL) 100 MG 24 hr tablet TAKE 1 TABLET BY MOUTH WITH OR IMMEDIATLY FOLLOWING A MEAL   omeprazole (PRILOSEC) 20 MG capsule Take 20 mg by mouth daily.   SYNTHROID 75 MCG tablet TAKE 1 TABLET BY MOUTH BEFORE BREAKFAST   [DISCONTINUED] albuterol (VENTOLIN HFA) 108 (90 Base) MCG/ACT inhaler Inhale 2 puffs into the lungs every 6 (six) hours as needed.    Review of Systems  Constitutional:  Negative for chills, fatigue and fever.  Respiratory:  Negative for cough, chest tightness, shortness of breath and wheezing.   Cardiovascular:  Negative for chest pain, palpitations and leg swelling.  Musculoskeletal:  Positive for arthralgias (left hip).   Objective:  BP 118/80    Pulse 66    Temp 97.6 F (36.4 C) (Oral)    Ht 5\' 7"  (1.702 m)    Wt 140 lb 3.2 oz (63.6 kg)    SpO2 98%    BMI 21.96 kg/m   Weight: 140 lb 3.2 oz (63.6 kg)   BP Readings from Last 3 Encounters:  12/03/21 118/80  11/25/21 110/62  11/21/21 (!) 160/90   Wt Readings from Last 3 Encounters:  12/03/21 140 lb 3.2 oz (63.6 kg)  11/25/21 140 lb 6.4 oz (63.7 kg)  11/21/21 140 lb (63.5 kg)    Physical Exam Constitutional:  General: She is not in acute distress.    Appearance: She is well-developed.  Cardiovascular:     Rate and Rhythm: Normal rate and regular rhythm.     Heart sounds: Normal heart sounds. No murmur heard.   No friction rub.  Pulmonary:     Effort: Pulmonary effort is normal. No respiratory distress.     Breath sounds: Normal breath sounds. No wheezing or rales.  Musculoskeletal:     Right lower leg: No edema.     Left lower leg: No edema.  Neurological:     Mental Status: She is alert and oriented to person, place, and time.  Psychiatric:        Behavior: Behavior normal.    Assessment/Plan 1. Hypothyroidism, unspecified type Continue with  synthroid; will check thyroid with future bloodwork.  - TSH; Future  2. Osteoporosis, unspecified osteoporosis type, unspecified pathological fracture presence She is agreeable to working on exercise program and more dedicated method for helping with bone density. Still hesitant for medications, but would agree if no improvement with more dedicated commitment to exercise. Info given for [facility], osteo strong to look into.  3. Vitamin D insufficiency Discussed importance ofsupplementation to help with bone strength. Will keep regular vitamin d checks until in better normal range.  - VITAMIN D 25 Hydroxy (Vit-D Deficiency, Fractures); Future  4. B12 deficiency Has improved.ok to cut supplementation to half of current dose.  - Vitamin B12; Future - Folate; Future  5. Hyperlipidemia, unspecified hyperlipidemia type - Comprehensive metabolic panel; Future - Lipid panel; Future  6. Palpitations She is working on tolerating cpap, which hopefully will help frompalpitaiton standpoint.   7. Snoring See above. Encouraged her tocheck in regularly if issues with tolerance so that adjustments can be made to equipment/settings if needed.  8. Exercise-induced asthma Has not needed ventolin, but rx is expired. - albuterol (VENTOLIN HFA) 108 (90 Base) MCG/ACT inhaler; Inhale 2 puffs into the lungs every 6 (six) hours as needed.  Dispense: 8.5 g; Refill: 1  9. Elevated blood pressure reading Encouraged her to check at home and report back.  - CBC with Differential/Platelet; Future  Let me know if she doesn't hear from ortho by end of week. Return in about 3 months (around 03/03/2022) for bloodwork.      [name], MD

## 2021-12-03 NOTE — Patient Instructions (Addendum)
Look into [facility] for bone density. Or can check with [employer] about covered physical therapy and/or exercise. [facility])- could probably tell you about benefits through insurance that they could maximize.     Let me know if you don't hear from ortho by the end of the week.

## 2021-12-05 ENCOUNTER — Encounter: Payer: Self-pay | Admitting: Family Medicine

## 2021-12-12 ENCOUNTER — Inpatient Hospital Stay (HOSPITAL_COMMUNITY)
Admission: EM | Disposition: A | Discharge status: Home / Self Care | Payer: Self-pay | Source: Home / Self Care | Attending: Cardiovascular Disease

## 2021-12-12 ENCOUNTER — Inpatient Hospital Stay (HOSPITAL_COMMUNITY)
Admission: EM | Admit: 2021-12-12 | Discharge: 2021-12-14 | DRG: 282 | Disposition: A | Discharge status: Home / Self Care | Payer: Medicare PPO | Attending: Cardiovascular Disease | Admitting: Cardiovascular Disease

## 2021-12-12 ENCOUNTER — Encounter (HOSPITAL_COMMUNITY): Payer: Self-pay

## 2021-12-12 ENCOUNTER — Other Ambulatory Visit: Payer: Self-pay

## 2021-12-12 ENCOUNTER — Observation Stay (HOSPITAL_COMMUNITY): Payer: Medicare PPO

## 2021-12-12 ENCOUNTER — Emergency Department (HOSPITAL_COMMUNITY): Payer: Medicare PPO

## 2021-12-12 DIAGNOSIS — F419 Anxiety disorder, unspecified: Secondary | ICD-10-CM | POA: Diagnosis present

## 2021-12-12 DIAGNOSIS — Z880 Allergy status to penicillin: Secondary | ICD-10-CM

## 2021-12-12 DIAGNOSIS — Z881 Allergy status to other antibiotic agents status: Secondary | ICD-10-CM | POA: Diagnosis not present

## 2021-12-12 DIAGNOSIS — I249 Acute ischemic heart disease, unspecified: Secondary | ICD-10-CM | POA: Diagnosis not present

## 2021-12-12 DIAGNOSIS — K219 Gastro-esophageal reflux disease without esophagitis: Secondary | ICD-10-CM | POA: Diagnosis present

## 2021-12-12 DIAGNOSIS — I10 Essential (primary) hypertension: Secondary | ICD-10-CM | POA: Diagnosis present

## 2021-12-12 DIAGNOSIS — Z79890 Hormone replacement therapy (postmenopausal): Secondary | ICD-10-CM | POA: Diagnosis not present

## 2021-12-12 DIAGNOSIS — Z882 Allergy status to sulfonamides status: Secondary | ICD-10-CM

## 2021-12-12 DIAGNOSIS — E039 Hypothyroidism, unspecified: Secondary | ICD-10-CM | POA: Diagnosis present

## 2021-12-12 DIAGNOSIS — Z79899 Other long term (current) drug therapy: Secondary | ICD-10-CM | POA: Diagnosis not present

## 2021-12-12 DIAGNOSIS — E559 Vitamin D deficiency, unspecified: Secondary | ICD-10-CM | POA: Diagnosis present

## 2021-12-12 DIAGNOSIS — E785 Hyperlipidemia, unspecified: Secondary | ICD-10-CM | POA: Diagnosis present

## 2021-12-12 DIAGNOSIS — I214 Non-ST elevation (NSTEMI) myocardial infarction: Secondary | ICD-10-CM | POA: Diagnosis present

## 2021-12-12 DIAGNOSIS — Z20822 Contact with and (suspected) exposure to covid-19: Secondary | ICD-10-CM | POA: Diagnosis present

## 2021-12-12 DIAGNOSIS — I200 Unstable angina: Secondary | ICD-10-CM | POA: Diagnosis present

## 2021-12-12 DIAGNOSIS — R0789 Other chest pain: Secondary | ICD-10-CM | POA: Diagnosis not present

## 2021-12-12 DIAGNOSIS — R001 Bradycardia, unspecified: Secondary | ICD-10-CM | POA: Diagnosis not present

## 2021-12-12 DIAGNOSIS — R079 Chest pain, unspecified: Secondary | ICD-10-CM | POA: Diagnosis not present

## 2021-12-12 DIAGNOSIS — I491 Atrial premature depolarization: Secondary | ICD-10-CM | POA: Diagnosis not present

## 2021-12-12 HISTORY — PX: LEFT HEART CATH AND CORONARY ANGIOGRAPHY: CATH118249

## 2021-12-12 LAB — RESP PANEL BY RT-PCR (FLU A&B, COVID) ARPGX2
Influenza A by PCR: NEGATIVE
Influenza B by PCR: NEGATIVE
SARS Coronavirus 2 by RT PCR: NEGATIVE

## 2021-12-12 LAB — CBC
HCT: 41.4 % (ref 36.0–46.0)
Hemoglobin: 13.7 g/dL (ref 12.0–15.0)
MCH: 31 pg (ref 26.0–34.0)
MCHC: 33.1 g/dL (ref 30.0–36.0)
MCV: 93.7 fL (ref 80.0–100.0)
Platelets: 259 10*3/uL (ref 150–400)
RBC: 4.42 MIL/uL (ref 3.87–5.11)
RDW: 12.2 % (ref 11.5–15.5)
WBC: 6.1 10*3/uL (ref 4.0–10.5)
nRBC: 0 % (ref 0.0–0.2)

## 2021-12-12 LAB — BASIC METABOLIC PANEL
Anion gap: 9 (ref 5–15)
BUN: 13 mg/dL (ref 8–23)
CO2: 21 mmol/L — ABNORMAL LOW (ref 22–32)
Calcium: 8.7 mg/dL — ABNORMAL LOW (ref 8.9–10.3)
Chloride: 108 mmol/L (ref 98–111)
Creatinine, Ser: 0.94 mg/dL (ref 0.44–1.00)
GFR, Estimated: >60 mL/min (ref >60)
Glucose, Bld: 157 mg/dL — ABNORMAL HIGH (ref 70–99)
Potassium: 3 mmol/L — ABNORMAL LOW (ref 3.5–5.1)
Sodium: 138 mmol/L (ref 135–145)

## 2021-12-12 LAB — TROPONIN I (HIGH SENSITIVITY)
Troponin I (High Sensitivity): 837 ng/L (ref <18)
Troponin I (High Sensitivity): 5833 ng/L (ref <18)

## 2021-12-12 SURGERY — LEFT HEART CATH AND CORONARY ANGIOGRAPHY
Anesthesia: LOCAL

## 2021-12-12 MED ORDER — ONDANSETRON HCL 4 MG/2ML IJ SOLN
4.0000 mg | Freq: Three times a day (TID) | INTRAMUSCULAR | Status: DC | PRN
  Administered 2021-12-12: 4 mg via INTRAVENOUS

## 2021-12-12 MED ORDER — LORAZEPAM 0.5 MG PO TABS: 0.2500 mg | ORAL_TABLET | Freq: Three times a day (TID) | ORAL | Status: DC | PRN

## 2021-12-12 MED ORDER — PANTOPRAZOLE SODIUM 40 MG PO TBEC
40.0000 mg | DELAYED_RELEASE_TABLET | Freq: Every day | ORAL | Status: DC
  Administered 2021-12-12 – 2021-12-14 (×3): 40 mg via ORAL
  Filled 2021-12-12 (×3): qty 1

## 2021-12-12 MED ORDER — HYDRALAZINE HCL 20 MG/ML IJ SOLN
10.0000 mg | INTRAMUSCULAR | Status: AC | PRN
  Administered 2021-12-12: 10 mg via INTRAVENOUS

## 2021-12-12 MED ORDER — HEPARIN SODIUM (PORCINE) 1000 UNIT/ML IJ SOLN
INTRAMUSCULAR | Status: DC | PRN
  Administered 2021-12-12: 4000 [IU] via INTRAVENOUS

## 2021-12-12 MED ORDER — MIDAZOLAM HCL 2 MG/2ML IJ SOLN
INTRAMUSCULAR | Status: DC | PRN
  Administered 2021-12-12: 2 mg via INTRAVENOUS

## 2021-12-12 MED ORDER — ALBUTEROL SULFATE (2.5 MG/3ML) 0.083% IN NEBU: 2.5000 mg | INHALATION_SOLUTION | Freq: Four times a day (QID) | RESPIRATORY_TRACT | Status: DC | PRN

## 2021-12-12 MED ORDER — HEPARIN SODIUM (PORCINE) 5000 UNIT/ML IJ SOLN
4000.0000 [IU] | Freq: Once | INTRAMUSCULAR | Status: AC
  Administered 2021-12-12: 4000 [IU] via INTRAVENOUS

## 2021-12-12 MED ORDER — IOHEXOL 350 MG/ML SOLN
INTRAVENOUS | Status: DC | PRN
  Administered 2021-12-12: 50 mL

## 2021-12-12 MED ORDER — LABETALOL HCL 5 MG/ML IV SOLN: 10.0000 mg | INTRAVENOUS | Status: AC | PRN

## 2021-12-12 MED ORDER — HYDRALAZINE HCL 20 MG/ML IJ SOLN
INTRAMUSCULAR | Status: AC
  Filled 2021-12-12: qty 1

## 2021-12-12 MED ORDER — VERAPAMIL HCL 2.5 MG/ML IV SOLN
INTRAVENOUS | Status: DC | PRN
  Administered 2021-12-12: 10 mL via INTRA_ARTERIAL

## 2021-12-12 MED ORDER — SODIUM CHLORIDE 0.9% FLUSH
3.0000 mL | Freq: Two times a day (BID) | INTRAVENOUS | Status: DC
  Administered 2021-12-13 – 2021-12-14 (×3): 3 mL via INTRAVENOUS

## 2021-12-12 MED ORDER — OXYCODONE HCL 5 MG PO TABS: 5.0000 mg | ORAL_TABLET | ORAL | Status: DC | PRN

## 2021-12-12 MED ORDER — LIDOCAINE HCL (PF) 1 % IJ SOLN
INTRAMUSCULAR | Status: AC
  Filled 2021-12-12: qty 30

## 2021-12-12 MED ORDER — HEPARIN (PORCINE) IN NACL 1000-0.9 UT/500ML-% IV SOLN
INTRAVENOUS | Status: DC | PRN
  Administered 2021-12-12 (×2): 500 mL

## 2021-12-12 MED ORDER — FENTANYL CITRATE (PF) 100 MCG/2ML IJ SOLN
INTRAMUSCULAR | Status: DC | PRN
  Administered 2021-12-12: 25 ug via INTRAVENOUS

## 2021-12-12 MED ORDER — SODIUM CHLORIDE 0.9% FLUSH: 3.0000 mL | INTRAVENOUS | Status: DC | PRN

## 2021-12-12 MED ORDER — LIDOCAINE HCL (PF) 1 % IJ SOLN
INTRAMUSCULAR | Status: DC | PRN
  Administered 2021-12-12: 2 mL

## 2021-12-12 MED ORDER — ACETAMINOPHEN 325 MG PO TABS
650.0000 mg | ORAL_TABLET | ORAL | Status: DC | PRN
  Administered 2021-12-13: 650 mg via ORAL
  Filled 2021-12-12: qty 2

## 2021-12-12 MED ORDER — ONDANSETRON HCL 4 MG/2ML IJ SOLN
4.0000 mg | Freq: Four times a day (QID) | INTRAMUSCULAR | Status: DC | PRN
  Filled 2021-12-12: qty 2

## 2021-12-12 MED ORDER — METOPROLOL SUCCINATE ER 100 MG PO TB24
100.0000 mg | ORAL_TABLET | Freq: Every day | ORAL | Status: DC
  Administered 2021-12-13 – 2021-12-14 (×2): 100 mg via ORAL
  Filled 2021-12-12 (×2): qty 1

## 2021-12-12 MED ORDER — ONDANSETRON HCL 4 MG/2ML IJ SOLN
INTRAMUSCULAR | Status: AC
  Filled 2021-12-12: qty 2

## 2021-12-12 MED ORDER — SODIUM CHLORIDE 0.9 % WEIGHT BASED INFUSION: 1.0000 mL/kg/h | INTRAVENOUS | Status: AC

## 2021-12-12 MED ORDER — MIDAZOLAM HCL 2 MG/2ML IJ SOLN
INTRAMUSCULAR | Status: AC
  Filled 2021-12-12: qty 2

## 2021-12-12 MED ORDER — HEPARIN SODIUM (PORCINE) 1000 UNIT/ML IJ SOLN
INTRAMUSCULAR | Status: AC
  Filled 2021-12-12: qty 10

## 2021-12-12 MED ORDER — MORPHINE SULFATE (PF) 2 MG/ML IV SOLN: 2.0000 mg | INTRAVENOUS | Status: DC | PRN

## 2021-12-12 MED ORDER — LABETALOL HCL 5 MG/ML IV SOLN
INTRAVENOUS | Status: AC
  Filled 2021-12-12: qty 4

## 2021-12-12 MED ORDER — NITROGLYCERIN 1 MG/10 ML FOR IR/CATH LAB
INTRA_ARTERIAL | Status: AC
  Filled 2021-12-12: qty 10

## 2021-12-12 MED ORDER — LEVOTHYROXINE SODIUM 75 MCG PO TABS
75.0000 ug | ORAL_TABLET | Freq: Every day | ORAL | Status: DC
  Administered 2021-12-13 – 2021-12-14 (×2): 75 ug via ORAL
  Filled 2021-12-12 (×2): qty 1

## 2021-12-12 MED ORDER — VERAPAMIL HCL 2.5 MG/ML IV SOLN
INTRAVENOUS | Status: AC
  Filled 2021-12-12: qty 2

## 2021-12-12 MED ORDER — HEPARIN (PORCINE) IN NACL 1000-0.9 UT/500ML-% IV SOLN
INTRAVENOUS | Status: AC
  Filled 2021-12-12: qty 1000

## 2021-12-12 MED ORDER — FENTANYL CITRATE (PF) 100 MCG/2ML IJ SOLN
INTRAMUSCULAR | Status: AC
  Filled 2021-12-12: qty 2

## 2021-12-12 MED ORDER — SODIUM CHLORIDE 0.9 % IV SOLN: 250.0000 mL | INTRAVENOUS | Status: DC | PRN

## 2021-12-12 SURGICAL SUPPLY — 11 items
CATH 5FR JL3.5 JR4 ANG PIG MP (CATHETERS) ×1 IMPLANT
DEVICE RAD COMP TR BAND LRG (VASCULAR PRODUCTS) ×1 IMPLANT
ELECT DEFIB PAD ADLT CADENCE (PAD) ×1 IMPLANT
GLIDESHEATH SLEND SS 6F .021 (SHEATH) ×1 IMPLANT
GUIDEWIRE INQWIRE 1.5J.035X260 (WIRE) IMPLANT
INQWIRE 1.5J .035X260CM (WIRE) ×2
KIT HEART LEFT (KITS) ×2 IMPLANT
PACK CARDIAC CATHETERIZATION (CUSTOM PROCEDURE TRAY) ×2 IMPLANT
SHEATH PROBE COVER 6X72 (BAG) ×1 IMPLANT
TRANSDUCER W/STOPCOCK (MISCELLANEOUS) ×2 IMPLANT
TUBING CIL FLEX 10 FLL-RA (TUBING) ×2 IMPLANT
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
## 2021-12-12 NOTE — ED Notes (Signed)
Cardiologist at bedside evaluating patient and speaking to patient and spouse.

## 2021-12-12 NOTE — ED Provider Notes (Signed)
[facility] EMERGENCY DEPARTMENT Provider Note   CSN: [id number] Arrival date & time: 12/12/21  1612     History  Chief Complaint  Patient presents with   Chest Pain    Tracey Giles is a 71 y.o. female.  HPI 71 year old female history of hypertension presents today via EMS with report of substernal chest pain.  Pain began she was riding a car with her husband.  It is substernal in nature and radiates to her left side of her neck.  It has been 8-9 out of 10.  She received aspirin prehospital.  She routinely received 2 sublingual nitroglycerin from EMS.  They report that her initial systolic blood pressure was in the 190s she had 2 sublingual nitroglycerin and it was in the 140s.  She do not have any significant change in her pain.  She has not had any similar symptoms in the past.  She denies any associated shortness of breath, diaphoresis, nausea, vomiting, or lightheadedness.  She has a history of hypertension but otherwise denies cardiac risk factors     Home Medications Prior to Admission medications   Medication Sig Start Date End Date Taking? Authorizing Provider  albuterol (VENTOLIN HFA) 108 (90 Base) MCG/ACT inhaler Inhale 2 puffs into the lungs every 6 (six) hours as needed. 12/03/21   Wynn Banker, MD  Cholecalciferol (VITAMIN D3 PO) Take 1,000 Units by mouth daily.    [provider]  Cyanocobalamin (B-12) 1000 MCG TABS Take by mouth daily.    [provider]  levocetirizine (XYZAL) 5 MG tablet Take 1 tablet (5 mg total) by mouth every evening. 02/28/21   Koberlein, Paris Lore, MD  LORazepam (ATIVAN) 0.5 MG tablet Take 0.5-1 tablets (0.25-0.5 mg total) by mouth every 8 (eight) hours as needed for anxiety. 12/05/20   Wynn Banker, MD  metoprolol succinate (TOPROL-XL) 100 MG 24 hr tablet TAKE 1 TABLET BY MOUTH WITH OR IMMEDIATLY FOLLOWING A MEAL 11/02/21   Koberlein, Paris Lore, MD  omeprazole (PRILOSEC) 20 MG capsule Take 20  mg by mouth daily.    [provider]  SYNTHROID 75 MCG tablet TAKE 1 TABLET BY MOUTH BEFORE BREAKFAST 11/02/21   Wynn Banker, MD      Allergies    Tums [calcium carbonate antacid], Cephalexin, Penicillins, and Sulfamethoxazole-trimethoprim    Review of Systems   Review of Systems  All other systems reviewed and are negative.  Physical Exam Updated Vital Signs Ht 1.727 m (5\' 8" )    Wt 63.5 kg    SpO2 100%    BMI 21.29 kg/m  Physical Exam Vitals and nursing note reviewed.  Constitutional:      Appearance: She is well-developed.  HENT:     Head: Normocephalic.  Eyes:     Pupils: Pupils are equal, round, and reactive to light.  Cardiovascular:     Rate and Rhythm: Normal rate and regular rhythm.     Heart sounds: Normal heart sounds.  Pulmonary:     Effort: Pulmonary effort is normal.     Breath sounds: Normal breath sounds.  Abdominal:     General: Bowel sounds are normal.     Palpations: Abdomen is soft.  Musculoskeletal:        General: Normal range of motion.     Cervical back: Normal range of motion.  Skin:    General: Skin is warm and dry.     Capillary Refill: Capillary refill takes less than 2 seconds.  Neurological:  General: No focal deficit present.     Mental Status: She is alert.  Psychiatric:        Mood and Affect: Mood normal.    ED Results / Procedures / Treatments   Labs (all labs ordered are listed, but only abnormal results are displayed) Labs Reviewed  RESP PANEL BY RT-PCR (FLU A&B, COVID) [id number]  BASIC METABOLIC PANEL  CBC  CBG MONITORING, ED  TROPONIN I (HIGH SENSITIVITY)    EKG EKG Interpretation  Date/Time:  Friday December 12 2021 16:16:14 EST Ventricular Rate:  60 PR Interval:  180 QRS Duration: 107 QT Interval:  462 QTC Calculation: 462 R Axis:   33 Text Interpretation: Sinus rhythm RSR' in V1 or V2, right VCD or RVH Borderline ST depression, lateral leads Confirmed by Margarita Grizzleay, Ily Denno [phone] on 12/12/2021  4:16:56 PM  Radiology No results found.  Procedures .Critical Care Performed by: Margarita Grizzleay, Hattie Aguinaldo, MD Authorized by: Margarita Grizzleay, Oluwasemilore Pascuzzi, MD   Critical care provider statement:    Critical care time (minutes):  30   Critical care was necessary to treat or prevent imminent or life-threatening deterioration of the following conditions:  Cardiac failure   Critical care was time spent personally by me on the following activities:  Development of treatment plan with patient or surrogate, discussions with consultants, evaluation of patient's response to treatment, examination of patient, ordering and review of laboratory studies, ordering and review of radiographic studies, ordering and performing treatments and interventions, pulse oximetry, re-evaluation of patient's condition and review of old charts    Medications Ordered in ED Medications  heparin injection 4,000 Units (4,000 Units Intravenous Given 12/12/21 1636)    ED Course/ Medical Decision Making/ A&P                           Medical Decision Making Patient with sudden onset of severe chest pain that although, described as sharp, otherwise concerning for cardiac chest pain.  Amount and/or Complexity of Data Reviewed Independent Historian: EMS External Data Reviewed: ECG.    Details: Prehospital EKGs reviewedWorsening pain in the area where with concern for elevation in aVR and V1 with diffuse ST depression Labs: ordered. Decision-making details documented in ED Course. Radiology: ordered. ECG/medicine tests: ordered.    Details: EKG obtained shows normal sinus rhythm with a rate of 60.  There is some ST elevation in aVR and V1 with diffuse ST depression in V3 through V6 and inferior leads Discussion of management or test interpretation with external provider(s): 71 year old female with sudden onset of chest pain.  EKG with concerning ST changes although does not technically meet ST elevation MI status.  Discussed with Dr. Excell Seltzerooper.  Plan  heparin, labs, x-rays.  He plan to take patient to the Cath Lab emergently within the next hour.  Risk Prescription drug management. Risk Details: Patient seen and evaluated for chest pain.  Differential diagnosis of serious/life threatening causes of chest pain includes ACS, other diseases of the heart such as myocarditis or pericarditis, lung etiologies such as infection or pneumothorax, diseases of the great vessels such as aortic dissection or AAA, pulmonary embolism, or GI sources such as cholecystitis or other upper abdominal causes. High suspicion for ACS- heart score documented, EKG reviewed, Given the timing of pain to ER presentation, Discussed care with cardiology and plan emergent catheterization Differential includes myocarditis/pericarditis/tamponade based on history, review of ekg and labs Differential includes aortic dissection based on history and review of imaging Doubt intrinsic lung  causes such as pneumonia or pneumothorax, based on history, physical exam, and studies obtained. Differential includes PE based on history, physical exam, and PERC Doubt acute GI etiology requiring intervention based on history, physical exam and labs.    Dr. Excell Seltzer at bedside.  Patient going to Cath Lab. Final Clinical Impression(s) / ED Diagnoses Final diagnoses:  ACS (acute coronary syndrome) [facility])    Rx / DC Orders ED Discharge Orders     None         Margarita Grizzle, MD 12/12/21 1650

## 2021-12-12 NOTE — Interval H&P Note (Signed)
History and Physical Interval Note:  12/12/2021 5:07 PM  Tracey Giles  has presented today for surgery, with the diagnosis of urgent.  The various methods of treatment have been discussed with the patient and family. After consideration of risks, benefits and other options for treatment, the patient has consented to  Procedure(s): LEFT HEART CATH AND CORONARY ANGIOGRAPHY (N/A) as a surgical intervention.  The patient's history has been reviewed, patient examined, no change in status, stable for surgery.  I have reviewed the patient's chart and labs.  Questions were answered to the patient's satisfaction.     Tonny Bollman

## 2021-12-12 NOTE — H&P (Signed)
Cardiology Admission History and Physical:   Patient ID: Tracey Giles MRN: [id number]; DOB: 1951-03-16   Admission date: 12/12/2021  PCP:  Tracey Macadam, MD   [facility] Providers Cardiologist:  Tracey Lesches, MD        Chief Complaint:  Chest pain  Patient Profile:   Tracey Giles is a 71 y.o. female with a history of hypertension and mitral valve prolapse who is being seen 12/12/2021 for the evaluation of chest pain.  History of Present Illness:   [name] was driving with her husband today when she suddenly developed severe substernal chest pain rated at 10/10.  She arrived via private vehicle to the emergency department with ongoing severe chest pain.  An EKG shows diffuse ST depression and elevation in aVR, concerning for severe diffuse ischemia and/or left main disease.  At the time of my evaluation in the emergency department, the patient complains of ongoing pain and nausea.  She denies shortness of breath, lightheadedness, or syncope.  She states that she is never had pain like this before.  She has had some trouble with palpitations and has undergone outpatient work-up in the past which is demonstrated normal cardiac function and no structural disease.  Outpatient monitoring has demonstrated PACs and PVCs without sustained arrhythmia.   Past Medical History:  Diagnosis Date   Anxiety    Asthma    exercise induced asthma    Essential hypertension    GERD (gastroesophageal reflux disease)    Heart murmur    mitral valve prolapse   History of dermatitis    History of migraine headaches yrs ago, none recent   Hyperlipemia 05/24/2016   Hypothyroidism    Meniere's disease    Palpitations    Rare PVCs by Holter monitor April 2011    Sleep apnea    Reportedly mild, no cpap needed   Tingling    both legs saw Tracey Giles 04-23-2020 no cause found   Vitamin D insufficiency 05/24/2016    Past Surgical History:  Procedure Laterality Date    APPENDECTOMY  as child   BREAST LUMPECTOMY  yrs ago   [place] N/A 05/07/2020   Procedure: DILATATION & CURETTAGE/HYSTEROSCOPY WITH MYOSURE/POLYP REMOVAL;  Surgeon: Tracey Dom, MD;  Location: [place];  Service: Gynecology;  Laterality: N/A;  polyp removal     Medications Prior to Admission: Prior to Admission medications   Medication Sig Start Date End Date Taking? Authorizing Provider  albuterol (VENTOLIN HFA) 108 (90 Base) MCG/ACT inhaler Inhale 2 puffs into the lungs every 6 (six) hours as needed. 12/03/21   Tracey Macadam, MD  Cholecalciferol (VITAMIN D3 PO) Take 1,000 Units by mouth daily.    [provider]  Cyanocobalamin (B-12) 1000 MCG TABS Take by mouth daily.    [provider]  levocetirizine (XYZAL) 5 MG tablet Take 1 tablet (5 mg total) by mouth every evening. 02/28/21   Tracey Giles, Tracey Berg, MD  LORazepam (ATIVAN) 0.5 MG tablet Take 0.5-1 tablets (0.25-0.5 mg total) by mouth every 8 (eight) hours as needed for anxiety. 12/05/20   Tracey Macadam, MD  metoprolol succinate (TOPROL-XL) 100 MG 24 hr tablet TAKE 1 TABLET BY MOUTH WITH OR IMMEDIATLY FOLLOWING A MEAL 11/02/21   Tracey Giles, Tracey Berg, MD  omeprazole (PRILOSEC) 20 MG capsule Take 20 mg by mouth daily.    [provider]  SYNTHROID 75 MCG tablet TAKE 1 TABLET BY MOUTH BEFORE BREAKFAST  11/02/21   Tracey Macadam, MD     Allergies:    Allergies  Allergen Reactions   Tums [Calcium Carbonate Antacid] Hives    Burning sensation in mouth, rash on torso   Cephalexin     itching   Penicillins     itching   Sulfamethoxazole-Trimethoprim     hives    Social History:   Social History   Socioeconomic History   Marital status: Married    Spouse name: Tracey Giles   Number of children: 0   Years of education: 14   Highest education level: Futures trader degree: occupational, Hotel manager, or vocational program   Occupational History   Occupation: Landscape architect: [employer]  Tobacco Use   Smoking status: Never   Smokeless tobacco: Never  Vaping Use   Vaping Use: Never used  Substance and Sexual Activity   Alcohol use: No   Drug use: No   Sexual activity: Not Currently  Other Topics Concern   Not on file  Social History Narrative   Work or School: retired - used to work for [employer] Situation: lives with husband      Spiritual Beliefs: Christian      Lifestyle: no regular exercise; diet is healthy      Right handed    No caffeine use:    Social Determinants of Radio broadcast assistant Strain: Low Risk    Difficulty of Paying Living Expenses: Not hard at all  Food Insecurity: No Food Insecurity   Worried About Charity fundraiser in the Last Year: Never true   Arboriculturist in the Last Year: Never true  Transportation Needs: No Transportation Needs   Lack of Transportation (Medical): No   Lack of Transportation (Non-Medical): No  Physical Activity: Insufficiently Active   Days of Exercise per Week: 3 days   Minutes of Exercise per Session: 20 min  Stress: No Stress Concern Present   Feeling of Stress : Not at all  Social Connections: Socially Integrated   Frequency of Communication with Friends and Family: More than three times a week   Frequency of Social Gatherings with Friends and Family: Patient refused   Attends Religious Services: More than 4 times per year   Active Member of [employer] or Organizations: Yes   Attends Music therapist: More than 4 times per year   Marital Status: Married  Human resources officer Violence: Not on file    Family History:   The patient's family history includes Asthma in her brother and mother; Deep vein thrombosis (age of onset: 48) in her brother; Melanoma (age of onset: 68) in her father; Stroke in her maternal grandfather and mother. There is no history of Thyroid disease.    ROS:  Please see  the history of present illness.  Positive for anxiety and heart palpitations.  All other ROS reviewed and negative.     Physical Exam/Data:   Vitals:   12/12/21 1617  SpO2: 100%  Weight: 63.5 kg  Height: 5\' 8"  (1.727 m)   No intake or output data in the 24 hours ending 12/12/21 1658 Last 3 Weights 12/12/2021 12/03/2021 11/25/2021  Weight (lbs) 140 lb 140 lb 3.2 oz 140 lb 6.4 oz  Weight (kg) 63.504 kg 63.594 kg 63.685 kg     Body mass index is 21.29 kg/m.  General:  Well nourished, well developed, in moderate distress secondary to pain HEENT: normal Neck: no  JVD Vascular: No carotid bruits; Distal pulses 2+ bilaterally   Cardiac:  normal S1, S2; RRR; no murmur  Lungs:  clear to auscultation bilaterally, no wheezing, rhonchi or rales  Abd: soft, nontender, no hepatomegaly  Ext: no edema Musculoskeletal:  No deformities, BUE and BLE strength normal and equal Skin: warm and dry  Neuro:  CNs 2-12 intact, no focal abnormalities noted Psych:  Normal affect    EKG:  The ECG that was done today was personally reviewed and demonstrates normal sinus rhythm with diffuse ST segment depression suspect diffuse myocardial ischemia, ST changes new from prior tracings  Relevant CV Studies: Echo 01/06/2019:   1. The left ventricle has hyperdynamic systolic function of 123XX123. The  cavity size is normal. There is mild focal basal septal left ventricular  wall thickness. Echo evidence of impaired relaxation diastolic filling  patterns. Normal left ventricular  filling pressures.   2. Normal left atrial size.   3. Normal right atrial size.   4. Normal tricuspid valve.   5. No atrial level shunt detected by color flow Doppler.   Laboratory Data:  High Sensitivity Troponin:  No results for input(s): TROPONINIHS in the last 720 hours.    ChemistryNo results for input(s): NA, K, CL, CO2, GLUCOSE, BUN, CREATININE, CALCIUM, MG, GFRNONAA, GFRAA, ANIONGAP in the last 168 hours.  No results for  input(s): PROT, ALBUMIN, AST, ALT, ALKPHOS, BILITOT in the last 168 hours. Lipids No results for input(s): CHOL, TRIG, HDL, LABVLDL, LDLCALC, CHOLHDL in the last 168 hours. HematologyNo results for input(s): WBC, RBC, HGB, HCT, MCV, MCH, MCHC, RDW, PLT in the last 168 hours. Thyroid No results for input(s): TSH, FREET4 in the last 168 hours. BNPNo results for input(s): BNP, PROBNP in the last 168 hours.  DDimer No results for input(s): DDIMER in the last 168 hours.   Radiology/Studies:  No results found.   Assessment and Plan:   ACS/unstable angina: The patient has high risk features of diffuse ST segment depression with isolated ST elevation in aVR.  This can be seen in patients with severe left main or multivessel ischemia.  Her symptoms started suddenly just prior to arrival in the emergency room.  Differential diagnosis includes plaque rupture with acute coronary syndrome versus acute Takotsubo syndrome.  The patient does have underlying anxiety and has been undergoing initiation of CPAP which has been hard for her.  Today she was driving in [place] to get a new piece of equipment for her CPAP machine.  Emergency cardiac catheterization and possible PCI are indicated.  I discussed the this indication with the patient and her husband who is present today. I have reviewed the risks, indications, and alternatives to cardiac catheterization, possible angioplasty, and stenting with the patient. Risks include but are not limited to bleeding, infection, vascular injury, stroke, myocardial infection, arrhythmia, kidney injury, radiation-related injury in the case of prolonged fluoroscopy use, emergency cardiac surgery, and death. The patient understands the risks of serious complication is 1-2 in 123XX123 with diagnostic cardiac cath and 1-2% or less with angioplasty/stenting.   Hypertension: Continue beta-blocker, adjust medications pending cardiac catheterization findings.  Disposition: Pending cardiac  catheterization findings.  We will check baseline labs, chest x-ray, and determine indicated treatments based on cardiac catheterization results.   Risk Assessment/Risk Scores:    TIMI Risk Score for Unstable Angina or Non-ST Elevation MI:   The patient's TIMI risk score is 4, which indicates a 20% risk of all cause mortality, new or recurrent myocardial infarction or  need for urgent revascularization in the next 14 days.       Severity of Illness: The appropriate patient status for this patient is INPATIENT. Inpatient status is judged to be reasonable and necessary in order to provide the required intensity of service to ensure the patient's safety. The patient's presenting symptoms, physical exam findings, and initial radiographic and laboratory data in the context of their chronic comorbidities is felt to place them at high risk for further clinical deterioration. Furthermore, it is not anticipated that the patient will be medically stable for discharge from the hospital within 2 midnights of admission.   * I certify that at the point of admission it is my clinical judgment that the patient will require inpatient hospital care spanning beyond 2 midnights from the point of admission due to high intensity of service, high risk for further deterioration and high frequency of surveillance required.*   For questions or updates, please contact [place] Please consult [email or web address] for contact info under     Signed, Sherren Mocha, MD  12/12/2021 4:58 PM

## 2021-12-13 DIAGNOSIS — I214 Non-ST elevation (NSTEMI) myocardial infarction: Secondary | ICD-10-CM | POA: Diagnosis not present

## 2021-12-13 DIAGNOSIS — Z20822 Contact with and (suspected) exposure to covid-19: Secondary | ICD-10-CM | POA: Diagnosis not present

## 2021-12-13 DIAGNOSIS — Z881 Allergy status to other antibiotic agents status: Secondary | ICD-10-CM | POA: Diagnosis not present

## 2021-12-13 DIAGNOSIS — E559 Vitamin D deficiency, unspecified: Secondary | ICD-10-CM | POA: Diagnosis not present

## 2021-12-13 DIAGNOSIS — K219 Gastro-esophageal reflux disease without esophagitis: Secondary | ICD-10-CM | POA: Diagnosis not present

## 2021-12-13 DIAGNOSIS — Z880 Allergy status to penicillin: Secondary | ICD-10-CM | POA: Diagnosis not present

## 2021-12-13 DIAGNOSIS — Z882 Allergy status to sulfonamides status: Secondary | ICD-10-CM | POA: Diagnosis not present

## 2021-12-13 DIAGNOSIS — Z79899 Other long term (current) drug therapy: Secondary | ICD-10-CM | POA: Diagnosis not present

## 2021-12-13 DIAGNOSIS — I200 Unstable angina: Secondary | ICD-10-CM | POA: Diagnosis not present

## 2021-12-13 DIAGNOSIS — I249 Acute ischemic heart disease, unspecified: Secondary | ICD-10-CM | POA: Diagnosis present

## 2021-12-13 DIAGNOSIS — E785 Hyperlipidemia, unspecified: Secondary | ICD-10-CM | POA: Diagnosis not present

## 2021-12-13 DIAGNOSIS — E039 Hypothyroidism, unspecified: Secondary | ICD-10-CM | POA: Diagnosis not present

## 2021-12-13 DIAGNOSIS — I10 Essential (primary) hypertension: Secondary | ICD-10-CM | POA: Diagnosis not present

## 2021-12-13 DIAGNOSIS — Z79890 Hormone replacement therapy (postmenopausal): Secondary | ICD-10-CM | POA: Diagnosis not present

## 2021-12-13 DIAGNOSIS — F419 Anxiety disorder, unspecified: Secondary | ICD-10-CM | POA: Diagnosis not present

## 2021-12-13 DIAGNOSIS — R079 Chest pain, unspecified: Secondary | ICD-10-CM | POA: Diagnosis not present

## 2021-12-13 LAB — LIPID PANEL
Cholesterol: 196 mg/dL (ref 0–200)
HDL: 67 mg/dL (ref >40)
LDL Cholesterol: 120 mg/dL — ABNORMAL HIGH (ref 0–99)
Total CHOL/HDL Ratio: 2.9 RATIO
Triglycerides: 45 mg/dL (ref <150)
VLDL: 9 mg/dL (ref 0–40)

## 2021-12-13 LAB — TROPONIN I (HIGH SENSITIVITY): Troponin I (High Sensitivity): 1871 ng/L (ref <18)

## 2021-12-13 MED ORDER — ONDANSETRON HCL 4 MG/2ML IJ SOLN
4.0000 mg | Freq: Four times a day (QID) | INTRAMUSCULAR | Status: DC | PRN
  Administered 2021-12-13: 4 mg via INTRAVENOUS
  Filled 2021-12-13: qty 2

## 2021-12-13 MED ORDER — AMLODIPINE BESYLATE 2.5 MG PO TABS
2.5000 mg | ORAL_TABLET | Freq: Every day | ORAL | Status: DC
  Administered 2021-12-13 – 2021-12-14 (×2): 2.5 mg via ORAL
  Filled 2021-12-13 (×2): qty 1

## 2021-12-13 MED ORDER — ATORVASTATIN CALCIUM 40 MG PO TABS
40.0000 mg | ORAL_TABLET | Freq: Every day | ORAL | Status: DC
  Administered 2021-12-13 – 2021-12-14 (×2): 40 mg via ORAL
  Filled 2021-12-13 (×2): qty 1

## 2021-12-13 MED ORDER — ASPIRIN EC 81 MG PO TBEC
81.0000 mg | DELAYED_RELEASE_TABLET | Freq: Every day | ORAL | Status: DC
  Administered 2021-12-13 – 2021-12-14 (×2): 81 mg via ORAL
  Filled 2021-12-13 (×2): qty 1

## 2021-12-13 NOTE — Plan of Care (Signed)
°  Problem: Clinical Measurements: Goal: Ability to maintain clinical measurements within normal limits will improve Outcome: Progressing   Problem: Health Behavior/Discharge Planning: Goal: Ability to manage health-related needs will improve Outcome: Progressing   Problem: Clinical Measurements: Goal: Cardiovascular complication will be avoided Outcome: Progressing   Problem: Pain Managment: Goal: General experience of comfort will improve Outcome: Progressing   Problem: Cardiovascular: Goal: Ability to achieve and maintain adequate cardiovascular perfusion will improve Outcome: Progressing   Problem: Cardiovascular: Goal: Vascular access site(s) Level 0-1 will be maintained Outcome: Progressing

## 2021-12-13 NOTE — Progress Notes (Signed)
Progress Note  Patient Name: Tracey Giles Date of Encounter: 12/13/2021  [facility] Cardiologist: Nona Dell, MD   Subjective   No CP   Nauseated   HA    Inpatient Medications    Scheduled Meds:  levothyroxine  75 mcg Oral Q0600   metoprolol succinate  100 mg Oral Daily   pantoprazole  40 mg Oral Daily   sodium chloride flush  3 mL Intravenous Q12H   Continuous Infusions:  sodium chloride     PRN Meds: sodium chloride, acetaminophen, albuterol, LORazepam, morphine injection, ondansetron (ZOFRAN) IV, oxyCODONE, sodium chloride flush   Vital Signs    Vitals:   12/12/21 1934 12/12/21 2217 12/13/21 0114 12/13/21 0522  BP: (!) 156/73 140/63 (!) 145/72 131/71  Pulse: 100 90 83 100  Resp: 18 15 17 17   Temp:  98 F (36.7 C) 98.4 F (36.9 C) 98.6 F (37 C)  TempSrc:  Oral Oral Oral  SpO2: 99% 98% 97% 99%  Weight:      Height:        Intake/Output Summary (Last 24 hours) at 12/13/2021 0742 Last data filed at 12/12/2021 1915 Gross per 24 hour  Intake --  Output 300 ml  Net -300 ml   Last 3 Weights 12/12/2021 12/03/2021 11/25/2021  Weight (lbs) 140 lb 140 lb 3.2 oz 140 lb 6.4 oz  Weight (kg) 63.504 kg 63.594 kg 63.685 kg      Telemetry    SR  - Personally Reviewed  ECG     - Personally Reviewed  Physical Exam   GEN: No acute distress.   Neck: No JVD Cardiac: RRR, no murmurs,  Respiratory: Clear to auscultation bilaterally. GI: Soft, nontender, non-distended  MS: No edema; No deformity. Neuro:  Nonfocal  Psych: Normal affect   Labs    High Sensitivity Troponin:   Recent Labs  Lab 12/12/21 1618 12/12/21 2012  TROPONINIHS 837* 5,833*     Chemistry Recent Labs  Lab 12/12/21 1618  NA 138  K 3.0*  CL 108  CO2 21*  GLUCOSE 157*  BUN 13  CREATININE 0.94  CALCIUM 8.7*  GFRNONAA >60  ANIONGAP 9    Lipids No results for input(s): CHOL, TRIG, HDL, LABVLDL, LDLCALC, CHOLHDL in the last 168 hours.  Hematology Recent Labs  Lab  12/12/21 1618  WBC 6.1  RBC 4.42  HGB 13.7  HCT 41.4  MCV 93.7  MCH 31.0  MCHC 33.1  RDW 12.2  PLT 259   Thyroid No results for input(s): TSH, FREET4 in the last 168 hours.  BNPNo results for input(s): BNP, PROBNP in the last 168 hours.  DDimer No results for input(s): DDIMER in the last 168 hours.   Radiology    CARDIAC CATHETERIZATION  Result Date: 12/12/2021   There is hyperdynamic left ventricular systolic function.   LV end diastolic pressure is normal.   The left ventricular ejection fraction is greater than 65% by visual estimate. 1.  Widely patent coronary arteries with mild irregularity of the proximal LAD, widely patent left main, widely patent left circumflex with minimal irregularity, and angiographically normal RCA 2.  Hyperdynamic LV function with LVEF greater than 65%, no regional wall motion abnormalities 3.  Normal LVEDP Recommendations: Suspect noncardiac chest pain.  Overnight observation, cycle cardiac enzymes, would anticipate hospital discharge tomorrow unless chest pain symptoms persist or any new issues arise.   DG Chest Portable 1 View  Result Date: 12/12/2021 CLINICAL DATA:  Chest pain at rest EXAM: PORTABLE  CHEST 1 VIEW COMPARISON:  Radiograph 07/01/2016 FINDINGS: The cardiomediastinal contours are normal. Mild chronic hyperinflation. Pulmonary vasculature is normal. No consolidation, pleural effusion, or pneumothorax. No acute osseous abnormalities are seen. IMPRESSION: No acute abnormality. Mild chronic hyperinflation can be seen in the setting of asthma. Electronically Signed   By: Narda Rutherford M.D.   On: 12/12/2021 20:23    Cardiac Studies   Cardiac cath    12/12/21    There is hyperdynamic left ventricular systolic function.   LV end diastolic pressure is normal.   The left ventricular ejection fraction is greater than 65% by visual estimate.   1.  Widely patent coronary arteries with mild irregularity of the proximal LAD, widely patent left main,  widely patent left circumflex with minimal irregularity, and angiographically normal RCA 2.  Hyperdynamic LV function with LVEF greater than 65%, no regional wall motion abnormalities 3.  Normal LVEDP  Recommendations: Suspect noncardiac chest pain.  Overnight observation, cycle cardiac enzymes, would anticipate hospital discharge tomorrow unless chest pain symptoms persist or any new issues arise.   Echo 01/06/2019:   1. The left ventricle has hyperdynamic systolic function of >65%. The  cavity size is normal. There is mild focal basal septal left ventricular  wall thickness. Echo evidence of impaired relaxation diastolic filling  patterns. Normal left ventricular  filling pressures.   2. Normal left atrial size.   3. Normal right atrial size.   4. Normal tricuspid valve.   5. No atrial level shunt detected by color flow Doppler.   Patient Profile     Tracey Giles is a 71 y.o. female with a history of hypertension who is being seen 12/12/2021 for the evaluation of chest pain.  Assessment & Plan    1  Chest pain   ACS   Troponin last night  5833 Question if patient lysed a platelet plug    ? If due to spasm Coronary arteries normal   LVEF normal   REcomm:  ecASA 81 mg  Amlodipone 2.5 mg for spasm Lipitor 40 mg  (for pleotropic effect)   Cycle enzmes    Repeat EKG   2  HTN  BP is fair   Follow with additoin of amlodipine   3  Nausea   Will follow  Rx Zofran  4  ? Hx MVP  Echo in 2020 did not show this   For questions or updates, please contact [facility] Please consult [email or web address] for contact info under        Signed, Dietrich Pates, MD  12/13/2021, 7:42 AM

## 2021-12-14 DIAGNOSIS — R079 Chest pain, unspecified: Secondary | ICD-10-CM | POA: Diagnosis not present

## 2021-12-14 DIAGNOSIS — I214 Non-ST elevation (NSTEMI) myocardial infarction: Secondary | ICD-10-CM

## 2021-12-14 MED ORDER — ASPIRIN 81 MG PO TBEC: 81.0000 mg | DELAYED_RELEASE_TABLET | Freq: Every day | ORAL | 2 refills | Status: AC

## 2021-12-14 MED ORDER — ATORVASTATIN CALCIUM 40 MG PO TABS: 40.0000 mg | ORAL_TABLET | Freq: Every day | ORAL | 1 refills | Status: DC

## 2021-12-14 MED ORDER — AMLODIPINE BESYLATE 2.5 MG PO TABS: 2.5000 mg | ORAL_TABLET | Freq: Every day | ORAL | 0 refills | Status: DC

## 2021-12-14 NOTE — Care Management (Signed)
°  Transition of Care [facility]) Screening Note   Patient Details  Name: Tracey Giles Date of Birth: 08-13-1951   Transition of [facility] CM/SW Contact:    Gala Lewandowsky, RN Phone Number: 12/14/2021, 10:12 AM    Transition of Care Department [facility]) has reviewed patient and no TOC needs have been identified at this time. We will continue to monitor patient advancement through interdisciplinary progression rounds. If new patient transition needs arise, please place a TOC consult.

## 2021-12-14 NOTE — Progress Notes (Signed)
Progress Note  Patient Name: Tracey Giles Date of Encounter: 12/14/2021  [facility] Cardiologist: Rozann Lesches, MD   Subjective   P[place]et feeling much better today  No nausea   No CP  No SOB   Inpatient Medications    Scheduled Meds:  amLODipine  2.5 mg Oral Daily   aspirin EC  81 mg Oral Daily   atorvastatin  40 mg Oral Daily   levothyroxine  75 mcg Oral Q0600   metoprolol succinate  100 mg Oral Daily   pantoprazole  40 mg Oral Daily   sodium chloride flush  3 mL Intravenous Q12H   Continuous Infusions:  sodium chloride     PRN Meds: sodium chloride, acetaminophen, albuterol, LORazepam, morphine injection, ondansetron (ZOFRAN) IV, oxyCODONE, sodium chloride flush   Vital Signs    Vitals:   12/13/21 1044 12/13/21 1045 12/13/21 1905 12/14/21 0505  BP: (!) 155/71  (!) 148/66 140/69  Pulse:  99 72 62  Resp:   18 15  Temp:   98.2 F (36.8 C) 98.1 F (36.7 C)  TempSrc:   Oral Oral  SpO2:   98% 96%  Weight:      Height:        Intake/Output Summary (Last 24 hours) at 12/14/2021 0800 Last data filed at 12/13/2021 2100 Gross per 24 hour  Intake 480 ml  Output 400 ml  Net 80 ml   Last 3 Weights 12/12/2021 12/03/2021 11/25/2021  Weight (lbs) 140 lb 140 lb 3.2 oz 140 lb 6.4 oz  Weight (kg) 63.504 kg 63.594 kg 63.685 kg      Telemetry    SR  - Personally Reviewed  ECG    NSR   Nonspecific ST changese  - Personally Reviewed  Physical Exam   GEN: No acute distress.   Neck: No JVD Cardiac: RRR, no murmurs,  Respiratory: Clear to auscultation bilaterally.  No rales  GI: Soft, nontender, non-distended  MS: No edema; No deformity. Neuro:  Nonfocal  Psych: Normal affect   Labs    High Sensitivity Troponin:   Recent Labs  Lab 12/12/21 1618 12/12/21 2012 12/13/21 1047  TROPONINIHS 837* 5,833* 1,871*     Chemistry Recent Labs  Lab 12/12/21 1618  NA 138  K 3.0*  CL 108  CO2 21*  GLUCOSE 157*  BUN 13  CREATININE 0.94  CALCIUM 8.7*   GFRNONAA >60  ANIONGAP 9    Lipids  Recent Labs  Lab 12/13/21 1047  CHOL 196  TRIG 45  HDL 67  LDLCALC 120*  CHOLHDL 2.9    Hematology Recent Labs  Lab 12/12/21 1618  WBC 6.1  RBC 4.42  HGB 13.7  HCT 41.4  MCV 93.7  MCH 31.0  MCHC 33.1  RDW 12.2  PLT 259   Thyroid No results for input(s): TSH, FREET4 in the last 168 hours.  BNPNo results for input(s): BNP, PROBNP in the last 168 hours.  DDimer No results for input(s): DDIMER in the last 168 hours.   Radiology    CARDIAC CATHETERIZATION  Result Date: 12/12/2021   There is hyperdynamic left ventricular systolic function.   LV end diastolic pressure is normal.   The left ventricular ejection fraction is greater than 65% by visual estimate. 1.  Widely patent coronary arteries with mild irregularity of the proximal LAD, widely patent left main, widely patent left circumflex with minimal irregularity, and angiographically normal RCA 2.  Hyperdynamic LV function with LVEF greater than 65%, no regional wall motion  abnormalities 3.  Normal LVEDP Recommendations: Suspect noncardiac chest pain.  Overnight observation, cycle cardiac enzymes, would anticipate hospital discharge tomorrow unless chest pain symptoms persist or any new issues arise.   DG Chest Portable 1 View  Result Date: 12/12/2021 CLINICAL DATA:  Chest pain at rest EXAM: PORTABLE CHEST 1 VIEW COMPARISON:  Radiograph 07/01/2016 FINDINGS: The cardiomediastinal contours are normal. Mild chronic hyperinflation. Pulmonary vasculature is normal. No consolidation, pleural effusion, or pneumothorax. No acute osseous abnormalities are seen. IMPRESSION: No acute abnormality. Mild chronic hyperinflation can be seen in the setting of asthma. Electronically Signed   By: Keith Rake M.D.   On: 12/12/2021 20:23    Cardiac Studies   Cardiac cath    12/12/21    There is hyperdynamic left ventricular systolic function.   LV end diastolic pressure is normal.   The left ventricular  ejection fraction is greater than 65% by visual estimate.   1.  Widely patent coronary arteries with mild irregularity of the proximal LAD, widely patent left main, widely patent left circumflex with minimal irregularity, and angiographically normal RCA 2.  Hyperdynamic LV function with LVEF greater than 65%, no regional wall motion abnormalities 3.  Normal LVEDP  Recommendations: Suspect noncardiac chest pain.  Overnight observation, cycle cardiac enzymes, would anticipate hospital discharge tomorrow unless chest pain symptoms persist or any new issues arise.   Echo 01/06/2019:   1. The left ventricle has hyperdynamic systolic function of 123XX123. The  cavity size is normal. There is mild focal basal septal left ventricular  wall thickness. Echo evidence of impaired relaxation diastolic filling  patterns. Normal left ventricular  filling pressures.   2. Normal left atrial size.   3. Normal right atrial size.   4. Normal tricuspid valve.   5. No atrial level shunt detected by color flow Doppler.   Patient Profile     Tracey Giles is a 71 y.o. female with a history of hypertension who is being seen 12/12/2021 for the evaluation of chest pain.  Assessment & Plan    1  NSTEMI Pt with acute CP   Cath with no signifciant CAD  Initial troponin normal  Peak at   5833 Question if patient lysed a platelet plug    ? If due to spasm Coronary arteries normal   LVEF normal  Today doing good    Plan for medical RX    REcomm:  ecASA 81 mg  Amlodipone 2.5 mg for spasm Lipitor 40 mg  (for pleotropic effect)      2  HTN  BP is good  Follow with additoin of amlodipine   3  Nausea   Resolved     4  ? Hx MVP  Echo in 2020 did not show this \  D/C home with close follow up    For questions or updates, please contact [place] Please consult [email or web address] for contact info under        Signed, Dorris Carnes, MD  12/14/2021, 8:00 AM

## 2021-12-14 NOTE — Plan of Care (Signed)
  Problem: Education: Goal: Knowledge of General Education information will improve Description: Including pain rating scale, medication(s)/side effects and non-pharmacologic comfort measures Outcome: Adequate for Discharge   Problem: Health Behavior/Discharge Planning: Goal: Ability to manage health-related needs will improve Outcome: Adequate for Discharge   Problem: Clinical Measurements: Goal: Ability to maintain clinical measurements within normal limits will improve Outcome: Adequate for Discharge Goal: Will remain free from infection Outcome: Adequate for Discharge Goal: Diagnostic test results will improve Outcome: Adequate for Discharge Goal: Respiratory complications will improve Outcome: Adequate for Discharge Goal: Cardiovascular complication will be avoided Outcome: Adequate for Discharge   Problem: Activity: Goal: Risk for activity intolerance will decrease Outcome: Adequate for Discharge   Problem: Nutrition: Goal: Adequate nutrition will be maintained Outcome: Adequate for Discharge   Problem: Coping: Goal: Level of anxiety will decrease Outcome: Adequate for Discharge   Problem: Elimination: Goal: Will not experience complications related to bowel motility Outcome: Adequate for Discharge Goal: Will not experience complications related to urinary retention Outcome: Adequate for Discharge   Problem: Pain Managment: Goal: General experience of comfort will improve Outcome: Adequate for Discharge   Problem: Safety: Goal: Ability to remain free from injury will improve Outcome: Adequate for Discharge   Problem: Skin Integrity: Goal: Risk for impaired skin integrity will decrease Outcome: Adequate for Discharge   Problem: Cardiovascular: Goal: Ability to achieve and maintain adequate cardiovascular perfusion will improve Outcome: Adequate for Discharge Goal: Vascular access site(s) Level 0-1 will be maintained Outcome: Adequate for Discharge   

## 2021-12-14 NOTE — Discharge Summary (Signed)
Discharge Summary    Patient ID: JAANA GREEAR MRN: [id number]; DOB: 1951-11-28  Admit date: 12/12/2021 Discharge date: 12/14/2021  PCP:  Caren Macadam, MD   [facility] Providers Cardiologist:  Rozann Lesches, MD   {  Discharge Diagnoses    Principal Problem:   Non-ST elevation (NSTEMI) myocardial infarction [facility]) Active Problems:   Hyperlipidemia   ACS (acute coronary syndrome) ([place])   Chest pain at rest  Diagnostic Studies/Procedures    Cath: 12/12/2021    There is hyperdynamic left ventricular systolic function.   LV end diastolic pressure is normal.   The left ventricular ejection fraction is greater than 65% by visual estimate.   1.  Widely patent coronary arteries with mild irregularity of the proximal LAD, widely patent left main, widely patent left circumflex with minimal irregularity, and angiographically normal RCA 2.  Hyperdynamic LV function with LVEF greater than 65%, no regional wall motion abnormalities 3.  Normal LVEDP  Recommendations: Suspect noncardiac chest pain.  Overnight observation, cycle cardiac enzymes, would anticipate hospital discharge tomorrow unless chest pain symptoms persist or any new issues arise.   _____________   History of Present Illness     Tracey Giles is a 71 y.o. female with PMH of HTN and mitral valve prolapse who presented with chest pain. Ms. Ibrahim was driving with her husband the day of admission when she suddenly developed severe substernal chest pain rated at 10/10.  She arrived via private vehicle to the emergency department with ongoing severe chest pain.  An EKG showed diffuse ST depression and elevation in aVR, concerning for severe diffuse ischemia and/or left main disease.  At the time of evaluation in the emergency department, the patient complained of ongoing pain and nausea.  She denied shortness of breath, lightheadedness, or syncope.  She stated that she had never had pain like this before.   She has had some trouble with palpitations and has undergone outpatient work-up in the past which demonstrated normal cardiac function and no structural disease.  Outpatient monitoring has demonstrated PACs and PVCs without sustained arrhythmia. She was evaluated in the ED by Dr. Burt Knack and brought to the cardiac cath for emergent cardiac cath.   Hospital Course     NSTEMI: underwent cardiac cath noted above with no culprit lesion noted. hsTn peaked at 5833. Unclear etiology of troponin elevation. Started on ASA 81mg  daily along with statin therapy. Question possible vasospasm and started on amlodipine 2.5 mg daily. No recurrent chest pain.  -- on ASA, lipitor 40mg , Toprol XL 100mg  daily, norvasc 2.5mg  daily  HTN: well controlled -- continue on Toprol 100mg  daily, norvasc 2.5mg  daily   HLD: LDL 120 -- started Lipitor 40mg  daily -- FLP/LFTs in 8 weeks   Hx of mitral valve prolapse?: Echo from 2020 did not reflect this  Hypothyroidism: continue synthroid   Patient was seen by Dr. Harrington Challenger and deemed stable for discharge home. Follow up in the office has been arranged. Medications sent to patient's pharmacy of choice prior to discharge.   Did the patient have an acute coronary syndrome (MI, NSTEMI, STEMI, etc) this admission?:  Yes                               AHA/ACC Clinical Performance & Quality Measures: Aspirin prescribed? - Yes ADP Receptor Inhibitor (Plavix/Clopidogrel, Brilinta/Ticagrelor or Effient/Prasugrel) prescribed (includes medically managed patients)? - No - no significant CAD noted on cath. ASA as  monotherapy Beta Blocker prescribed? - Yes High Intensity Statin (Lipitor 40-80mg  or Crestor 20-40mg ) prescribed? - Yes EF assessed during THIS hospitalization? - Yes For EF <40%, was ACEI/ARB prescribed? - Not Applicable (EF >/= [id number]) For EF <40%, Aldosterone Antagonist (Spironolactone or Eplerenone) prescribed? - Not Applicable (EF >/= [id number]) Cardiac Rehab Phase II ordered (including  medically managed patients)? - No - no CAD noted on cath, suspected vasospasm     _____________  Discharge Vitals Blood pressure 110/70, pulse 74, temperature 98.1 F (36.7 C), temperature source Oral, resp. rate 15, height 5\' 8"  (1.727 m), weight 63.5 kg, SpO2 96 %.  Filed Weights   12/12/21 1617  Weight: 63.5 kg    Labs & Radiologic Studies    CBC Recent Labs    12/12/21 1618  WBC 6.1  HGB 13.7  HCT 41.4  MCV 93.7  PLT [id number]   Basic Metabolic Panel Recent Labs    12/12/21 1618  NA 138  K 3.0*  CL 108  CO2 21*  GLUCOSE 157*  BUN 13  CREATININE 0.94  CALCIUM 8.7*   Liver Function Tests No results for input(s): AST, ALT, ALKPHOS, BILITOT, PROT, ALBUMIN in the last 72 hours. No results for input(s): LIPASE, AMYLASE in the last 72 hours. High Sensitivity Troponin:   Recent Labs  Lab 12/12/21 1618 12/12/21 2012 12/13/21 1047  TROPONINIHS 837* 5,833* 1,871*    BNP Invalid input(s): POCBNP D-Dimer No results for input(s): DDIMER in the last 72 hours. Hemoglobin A1C No results for input(s): HGBA1C in the last 72 hours. Fasting Lipid Panel Recent Labs    12/13/21 1047  CHOL 196  HDL 67  LDLCALC 120*  TRIG 45  CHOLHDL 2.9   Thyroid Function Tests No results for input(s): TSH, T4TOTAL, T3FREE, THYROIDAB in the last 72 hours.  Invalid input(s): FREET3 _____________  CARDIAC CATHETERIZATION  Result Date: 12/12/2021   There is hyperdynamic left ventricular systolic function.   LV end diastolic pressure is normal.   The left ventricular ejection fraction is greater than 65% by visual estimate. 1.  Widely patent coronary arteries with mild irregularity of the proximal LAD, widely patent left main, widely patent left circumflex with minimal irregularity, and angiographically normal RCA 2.  Hyperdynamic LV function with LVEF greater than 65%, no regional wall motion abnormalities 3.  Normal LVEDP Recommendations: Suspect noncardiac chest pain.  Overnight observation,  cycle cardiac enzymes, would anticipate hospital discharge tomorrow unless chest pain symptoms persist or any new issues arise.   DG Chest Portable 1 View  Result Date: 12/12/2021 CLINICAL DATA:  Chest pain at rest EXAM: PORTABLE CHEST 1 VIEW COMPARISON:  Radiograph 07/01/2016 FINDINGS: The cardiomediastinal contours are normal. Mild chronic hyperinflation. Pulmonary vasculature is normal. No consolidation, pleural effusion, or pneumothorax. No acute osseous abnormalities are seen. IMPRESSION: No acute abnormality. Mild chronic hyperinflation can be seen in the setting of asthma. Electronically Signed   By: Keith Rake M.D.   On: 12/12/2021 20:23   DG Hip Unilat W OR W/O Pelvis 2-3 Views Left  Result Date: 11/22/2021 CLINICAL DATA:  Hip pain. EXAM: DG HIP (WITH OR WITHOUT PELVIS) 2-3V LEFT COMPARISON:  CT abdomen and pelvis 10/04/2017. FINDINGS: There is no evidence of hip fracture or dislocation. Joint spaces are maintained. Cam morphology identified of the left hip. No focal osseous lesions are identified. Soft tissues are within normal limits. IMPRESSION: 1. No acute osseous abnormality. 2. Cam type morphology of the left femur, femoroacetabular impingement syndrome. Electronically Signed   By:  Ronney Asters M.D.   On: 11/22/2021 15:31   Disposition   Pt is being discharged home today in good condition.  Follow-up Plans & Appointments     Follow-up Information     Erma Heritage, PA-C Follow up on 01/02/2022.   Specialties: Physician Assistant, Cardiology Why: at 2:30pm for your follow up appt with Dr. Chuck Hint' [place] information: [place] [address] [phone]                  Discharge Medications   Allergies as of 12/14/2021       Reactions   Tums [calcium Carbonate Antacid] Hives   Burning sensation in mouth, rash on torso   Cephalexin Itching   Penicillins Itching   Sulfamethoxazole-trimethoprim Hives        Medication  List     STOP taking these medications    omeprazole 20 MG capsule Commonly known as: PRILOSEC       TAKE these medications    acetaminophen 500 MG tablet Commonly known as: TYLENOL Take 1,000 mg by mouth every 6 (six) hours as needed (pain).   albuterol 108 (90 Base) MCG/ACT inhaler Commonly known as: VENTOLIN HFA Inhale 2 puffs into the lungs every 6 (six) hours as needed. What changed: reasons to take this   amLODipine 2.5 MG tablet Commonly known as: NORVASC Take 1 tablet (2.5 mg total) by mouth daily. Start taking on: December 15, 2021   aspirin 81 MG EC tablet Take 1 tablet (81 mg total) by mouth daily. Swallow whole. Start taking on: December 15, 2021   atorvastatin 40 MG tablet Commonly known as: LIPITOR Take 1 tablet (40 mg total) by mouth daily. Start taking on: December 15, 2021   B-12 1000 MCG Tabs Take 1,000 mcg by mouth every morning.   cholecalciferol 25 MCG (1000 UNIT) tablet Commonly known as: VITAMIN D Take 1,000 Units by mouth every morning.   FOLIC ACID PO Take 1 tablet by mouth every morning.   levocetirizine 5 MG tablet Commonly known as: XYZAL Take 1 tablet (5 mg total) by mouth every evening.   LORazepam 0.5 MG tablet Commonly known as: ATIVAN Take 0.5-1 tablets (0.25-0.5 mg total) by mouth every 8 (eight) hours as needed for anxiety.   metoprolol succinate 100 MG 24 hr tablet Commonly known as: TOPROL-XL TAKE 1 TABLET BY MOUTH WITH OR IMMEDIATLY FOLLOWING A MEAL What changed:  how much to take when to take this additional instructions   omeprazole 20 MG tablet Commonly known as: PRILOSEC OTC Take 20 mg by mouth daily as needed (acid reflux/heartburn).   Synthroid 75 MCG tablet Generic drug: levothyroxine TAKE 1 TABLET BY MOUTH BEFORE BREAKFAST What changed: See the new instructions.        Outstanding Labs/Studies   FLP/LFTs in 8 weeks   Duration of Discharge Encounter   Greater than 30 minutes including physician  time.  Signed, Reino Bellis, NP 12/14/2021, 11:25 AM

## 2021-12-15 ENCOUNTER — Ambulatory Visit: Payer: Medicare PPO | Admitting: Neurology

## 2021-12-15 ENCOUNTER — Encounter (HOSPITAL_COMMUNITY): Payer: Self-pay | Admitting: Cardiovascular Disease

## 2021-12-15 MED FILL — Nitroglycerin IV Soln 100 MCG/ML in D5W: INTRA_ARTERIAL | Qty: 10 | Status: AC

## 2021-12-16 LAB — LIPOPROTEIN A (LPA): Lipoprotein (a): 33.5 nmol/L — ABNORMAL HIGH (ref <75.0)

## 2021-12-19 ENCOUNTER — Ambulatory Visit: Payer: Medicare PPO | Admitting: Orthopaedic Surgery

## 2021-12-23 ENCOUNTER — Encounter: Payer: Self-pay | Admitting: Neurology

## 2021-12-30 ENCOUNTER — Telehealth: Payer: Self-pay

## 2021-12-30 NOTE — Telephone Encounter (Signed)
FYI: Notification from [employer]....Marland KitchenMarland KitchenThe following patient Tracey Giles has returned her Luna G3 machine yesterday.  Patient states she can't sleep with this machine and would like to return the machine.  She also states she is not comfortable with the mask and has exhausted all options to make it work.  Her doctor is Engineer, production.

## 2022-01-01 NOTE — Progress Notes (Signed)
Cardiology Office Note    Date:  01/02/2022   ID:  Tracey FischerCatherine M Giles, DOB Jan 19, 1951, MRN [id number]  PCP:  Wynn BankerKoberlein, Junell C, MD  Cardiologist: Nona DellSamuel McDowell, MD    Chief Complaint  Patient presents with   Hospitalization Follow-up    History of Present Illness:    Tracey Giles is a 71 y.o. female with past medical history of palpitations (PAC's and PVC's by prior monitor), HTN, GERD and asthma who presents to the office today for hospital follow-up.  She was most recently admitted to [facility] on 12/12/2021 for evaluation of chest pain which started the day of admission. Her initial EKG showed diffuse ST depression and isolated ST elevation in aVR. She was found to have an NSTEMI with peak Hs Troponin of 5833. She underwent a cardiac catheterization the day of admission which showed widely patent coronary arteries with mild irregularity along the proximal LAD and hyperdynamic LV function with EF greater than 65% with no regional wall motion abnormalities. It was felt that her symptoms could have possibly been due to spasm and she was started on Amlodipine 2.5 mg daily along with being started on ASA 81 mg daily and Lipitor 40 mg daily.  In talking with the patient today, she reports doing well from a cardiac perspective since her recent hospitalization and denies any recurrent chest pain. Her breathing has been at baseline with no specific orthopnea, PND or pitting edema. No recent palpitations, dizziness or presyncope. She does report muscle aches along her legs and lower back and says this started following hospital discharge. She was started on Lipitor 40 mg daily during that time and was not on statin therapy prior to admission.   Past Medical History:  Diagnosis Date   Anxiety    Asthma    exercise induced asthma    Essential hypertension    GERD (gastroesophageal reflux disease)    Heart murmur    mitral valve prolapse   History of dermatitis    History of  migraine headaches yrs ago, none recent   Hyperlipemia 05/24/2016   Hypothyroidism    Meniere's disease    NSTEMI (non-ST elevated myocardial infarction) (HCC)    a. s/p NSTEMI in 12/2021 with cath showing normal cors and felt to possibly be secondary to spasm.   Palpitations    Rare PVCs by Holter monitor April 2011    Sleep apnea    Reportedly mild, no cpap needed   Tingling    both legs saw dr Epimenio Footsater 04-23-2020 no cause found   Vitamin D insufficiency 05/24/2016    Past Surgical History:  Procedure Laterality Date   APPENDECTOMY  as child   BREAST LUMPECTOMY  yrs ago   Benign   DILATATION & CURETTAGE/HYSTEROSCOPY WITH MYOSURE N/A 05/07/2020   Procedure: DILATATION & CURETTAGE/HYSTEROSCOPY WITH MYOSURE/POLYP REMOVAL;  Surgeon: Romualdo BolkJertson, Jill Evelyn, MD;  [facility];  Service: Gynecology;  Laterality: N/A;  polyp removal   LEFT HEART CATH AND CORONARY ANGIOGRAPHY N/A 12/12/2021   Procedure: LEFT HEART CATH AND CORONARY ANGIOGRAPHY;  Surgeon: Tonny Bollmanooper, Michael, MD;  Location: [facility] INVASIVE CV LAB;  Service: Cardiovascular;  Laterality: N/A;    Current Medications: Outpatient Medications Prior to Visit  Medication Sig Dispense Refill   acetaminophen (TYLENOL) 500 MG tablet Take 1,000 mg by mouth every 6 (six) hours as needed (pain).     albuterol (VENTOLIN HFA) 108 (90 Base) MCG/ACT inhaler Inhale 2 puffs into the lungs every 6 (six) hours  as needed. (Patient taking differently: Inhale 2 puffs into the lungs every 6 (six) hours as needed for wheezing or shortness of breath.) 8.5 g 1   aspirin EC 81 MG EC tablet Take 1 tablet (81 mg total) by mouth daily. Swallow whole. 90 tablet 2   cholecalciferol (VITAMIN D) 25 MCG (1000 UNIT) tablet Take 1,000 Units by mouth every morning.     Cyanocobalamin (B-12) 1000 MCG TABS Take 1,000 mcg by mouth every morning.     FOLIC ACID PO Take 1 tablet by mouth every morning.     LORazepam (ATIVAN) 0.5 MG tablet Take 0.5-1 tablets  (0.25-0.5 mg total) by mouth every 8 (eight) hours as needed for anxiety. 10 tablet 0   metoprolol succinate (TOPROL-XL) 100 MG 24 hr tablet TAKE 1 TABLET BY MOUTH WITH OR IMMEDIATLY FOLLOWING A MEAL (Patient taking differently: 100 mg daily after supper.) 90 tablet 1   omeprazole (PRILOSEC OTC) 20 MG tablet Take 20 mg by mouth daily as needed (acid reflux/heartburn).     SYNTHROID 75 MCG tablet TAKE 1 TABLET BY MOUTH BEFORE BREAKFAST (Patient taking differently: Take 75 mcg by mouth daily before breakfast.) 90 tablet 1   amLODipine (NORVASC) 2.5 MG tablet Take 1 tablet (2.5 mg total) by mouth daily. 90 tablet 0   atorvastatin (LIPITOR) 40 MG tablet Take 1 tablet (40 mg total) by mouth daily. 90 tablet 1   levocetirizine (XYZAL) 5 MG tablet Take 1 tablet (5 mg total) by mouth every evening. (Patient not taking: Reported on 12/12/2021) 30 tablet 5   No facility-administered medications prior to visit.     Allergies:   Tums [calcium carbonate antacid], Cephalexin, Penicillins, and Sulfamethoxazole-trimethoprim   Social History   Socioeconomic History   Marital status: Married    Spouse name: [name]   Number of children: 0   Years of education: 14   Highest education level: Associate degree: occupational, Scientist, product/process developmenttechnical, or vocational program  Occupational History   Occupation: Development worker, international aiduncg    Employer: [employer]  Tobacco Use   Smoking status: Never   Smokeless tobacco: Never  Vaping Use   Vaping Use: Never used  Substance and Sexual Activity   Alcohol use: No   Drug use: No   Sexual activity: Not Currently  Other Topics Concern   Not on file  Social History Narrative   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      Home Situation: lives with husband      Spiritual Beliefs: Christian      Lifestyle: no regular exercise; diet is healthy      Right handed    No caffeine use:    Social Determinants of Corporate investment bankerHealth   Financial Resource Strain: Low Risk     Difficulty of Paying Living Expenses: Not hard at all  Food Insecurity: No Food Insecurity   Worried About Programme researcher, broadcasting/film/videounning Out of Food in the Last Year: Never true   Baristaan Out of Food in the Last Year: Never true  Transportation Needs: No Transportation Needs   Lack of Transportation (Medical): No   Lack of Transportation (Non-Medical): No  Physical Activity: Insufficiently Active   Days of Exercise per Week: 3 days   Minutes of Exercise per Session: 20 min  Stress: No Stress Concern Present   Feeling of Stress : Not at all  Social Connections: Socially Integrated   Frequency of Communication with Friends and Family: More than three times a week   Frequency of  Social Gatherings with Friends and Family: Patient refused   Attends Religious Services: More than 4 times per year   Active Member of [employer] or Organizations: Yes   Attends Engineer, structural: More than 4 times per year   Marital Status: Married     Family History:  The patient's family history includes Asthma in her brother and mother; Deep vein thrombosis (age of onset: 22) in her brother; Melanoma (age of onset: 28) in her father; Stroke in her maternal grandfather and mother.   Review of Systems:    Please see the history of present illness.     All other systems reviewed and are otherwise negative except as noted above.   Physical Exam:    VS:  BP 126/80    Pulse 60    Ht 5\' 7"  (1.702 m)    Wt 135 lb (61.2 kg)    SpO2 99%    BMI 21.14 kg/m    General: Well developed, well nourished,female appearing in no acute distress. Head: Normocephalic, atraumatic. Neck: No carotid bruits. JVD not elevated.  Lungs: Respirations regular and unlabored, without wheezes or rales.  Heart: Regular rate and rhythm. No S3 or S4.  No murmur, no rubs, or gallops appreciated. Abdomen: Appears non-distended. No obvious abdominal masses. Msk:  Strength and tone appear normal for age. No obvious joint deformities or effusions. Extremities:  No clubbing or cyanosis. No pitting edema.  Distal pedal pulses are 2+ bilaterally. Radial site without ecchymosis or evidence of a hematoma.  Neuro: Alert and oriented X 3. Moves all extremities spontaneously. No focal deficits noted. Psych:  Responds to questions appropriately with a normal affect. Skin: No rashes or lesions noted  Wt Readings from Last 3 Encounters:  01/02/22 135 lb (61.2 kg)  12/12/21 140 lb (63.5 kg)  12/03/21 140 lb 3.2 oz (63.6 kg)     Studies/Labs Reviewed:   EKG:  EKG is not ordered today.    Recent Labs: 02/28/2021: ALT 7 05/16/2021: Magnesium 2.3 12/12/2021: BUN 13; Creatinine, Ser 0.94; Hemoglobin 13.7; Platelets 259; Potassium 3.0; Sodium 138   Lipid Panel    Component Value Date/Time   CHOL 196 12/13/2021 1047   TRIG 45 12/13/2021 1047   HDL 67 12/13/2021 1047   CHOLHDL 2.9 12/13/2021 1047   VLDL 9 12/13/2021 1047   LDLCALC 120 (H) 12/13/2021 1047    Additional studies/ records that were reviewed today include:   Echocardiogram: 12/2018 IMPRESSIONS     1. The left ventricle has hyperdynamic systolic function of >65%. The  cavity size is normal. There is mild focal basal septal left ventricular  wall thickness. Echo evidence of impaired relaxation diastolic filling  patterns. Normal left ventricular  filling pressures.   2. Normal left atrial size.   3. Normal right atrial size.   4. Normal tricuspid valve.   5. No atrial level shunt detected by color flow Doppler.   LHC: 12/2021   There is hyperdynamic left ventricular systolic function.   LV end diastolic pressure is normal.   The left ventricular ejection fraction is greater than 65% by visual estimate.   1.  Widely patent coronary arteries with mild irregularity of the proximal LAD, widely patent left main, widely patent left circumflex with minimal irregularity, and angiographically normal RCA 2.  Hyperdynamic LV function with LVEF greater than 65%, no regional wall motion  abnormalities 3.  Normal LVEDP  Recommendations: Suspect noncardiac chest pain.  Overnight observation, cycle cardiac enzymes, would anticipate hospital  discharge tomorrow unless chest pain symptoms persist or any new issues arise.  Assessment:    1. Non-ST elevation myocardial infarction (NSTEMI), subsequent episode of care (HCC)   2. Coronary vasospasm (HCC)   3. Palpitations   4. Hyperlipidemia LDL goal <70   5. Medication management      Plan:   In order of problems listed above:  1. Subsequent Episode of Care for NSTEMI/Coronary Vasospasm - Recently admitted for an NSTEMI and cardiac catheterization showed widely patent coronary arteries with only mild irregularities and it was felt that her symptoms and enzyme elevation could have been secondary to coronary vasospasm. - She denies any recurrent chest pain since hospital discharge and her breathing has been stable. - Continue current medication regimen with ASA 81 mg daily, Toprol-XL 100 mg daily and Amlodipine 2.5 mg daily. Will reduce Atorvastatin from 40 mg daily to 20 mg daily due to her myalgias.  2. Palpitations - She had PAC's and PVC's by prior monitor. She denies any recent palpitations but does report her heart rate is in the 50's at times but no dizziness or presyncope with this. Will continue Toprol-XL at current dose of 100 mg daily.  3. HLD - FLP during recent admission showed total cholesterol 196, triglycerides 45, HDL 67 and LDL 120. She was started on Atorvastatin 40 mg daily but reports myalgias with this. Therefore, will reduce dosing to 20 mg daily. I encouraged her to report back if she continues to have myalgias as we could consider switching to low-dose Crestor. Will recheck FLP and LFT's in 2 months.     Medication Adjustments/Labs and Tests Ordered: Current medicines are reviewed at length with the patient today.  Concerns regarding medicines are outlined above.  Medication changes, Labs and Tests  ordered today are listed in the Patient Instructions below. Patient Instructions  Medication Instructions:   DECREASE Atorvastatin to 20 mg daily. MyChart message [place] if you cannot tolerate  Labwork: Fasting Lipid and Liver function tests in 2 months  Testing/Procedures: None today  Follow-Up: 3-4 months  Any Other Special Instructions Will Be Listed Below (If Applicable).  If you need a refill on your cardiac medications before your next appointment, please call your pharmacy.    Signed, Ellsworth Lennox, PA-C  01/02/2022 3:13 PM    [facility] [address] [place], [place] [address] Phone: [phone] Fax: [phone]

## 2022-01-02 ENCOUNTER — Ambulatory Visit: Payer: Medicare PPO | Admitting: Student

## 2022-01-02 ENCOUNTER — Encounter: Payer: Self-pay | Admitting: Student

## 2022-01-02 VITALS — BP 126/80 | HR 60 | Ht 67.0 in | Wt 135.0 lb

## 2022-01-02 DIAGNOSIS — I201 Angina pectoris with documented spasm: Secondary | ICD-10-CM | POA: Diagnosis not present

## 2022-01-02 DIAGNOSIS — R002 Palpitations: Secondary | ICD-10-CM | POA: Diagnosis not present

## 2022-01-02 DIAGNOSIS — Z79899 Other long term (current) drug therapy: Secondary | ICD-10-CM

## 2022-01-02 DIAGNOSIS — I214 Non-ST elevation (NSTEMI) myocardial infarction: Secondary | ICD-10-CM

## 2022-01-02 DIAGNOSIS — E785 Hyperlipidemia, unspecified: Secondary | ICD-10-CM | POA: Diagnosis not present

## 2022-01-02 MED ORDER — AMLODIPINE BESYLATE 2.5 MG PO TABS: 2.5000 mg | ORAL_TABLET | Freq: Every day | ORAL | 3 refills | Status: DC

## 2022-01-02 MED ORDER — ATORVASTATIN CALCIUM 40 MG PO TABS: 20.0000 mg | ORAL_TABLET | Freq: Every day | ORAL | 0 refills | Status: DC

## 2022-01-02 NOTE — Patient Instructions (Signed)
Medication Instructions:   DECREASE Atorvastatin to 20 mg daily. MyChart message [place] if you cannot tolerate  Labwork: Fasting Lipid and Liver function tests in 2 months  Testing/Procedures: None today  Follow-Up: 3-4 months  Any Other Special Instructions Will Be Listed Below (If Applicable).  If you need a refill on your cardiac medications before your next appointment, please call your pharmacy.

## 2022-01-07 ENCOUNTER — Other Ambulatory Visit: Payer: Self-pay

## 2022-01-07 ENCOUNTER — Encounter: Payer: Self-pay | Admitting: Orthopaedic Surgery

## 2022-01-07 ENCOUNTER — Ambulatory Visit: Payer: Medicare PPO | Admitting: Orthopaedic Surgery

## 2022-01-07 DIAGNOSIS — M1612 Unilateral primary osteoarthritis, left hip: Secondary | ICD-10-CM

## 2022-01-07 NOTE — Progress Notes (Signed)
Office Visit Note   Patient: Tracey Giles           Date of Birth: 1951/08/15           MRN: [id number] Visit Date: 01/07/2022              Requested by: Wynn Banker, MD [address] [place],  [place] [address] PCP: Wynn Banker, MD   Assessment & Plan: Visit Diagnoses:  1. Primary osteoarthritis of left hip     Plan: Based on findings impression is mild left hip osteoarthritis.  Her symptoms are manageable for now.  She should continue to take Tylenol as needed.  If she decides she wants to try cortisone injection she will let us know.  Otherwise she will see [place] back as needed.  Follow-Up Instructions: No follow-ups on file.   Orders:  No orders of the defined types were placed in this encounter.  No orders of the defined types were placed in this encounter.     Procedures: No procedures performed   Clinical Data: No additional findings.   Subjective: Chief Complaint  Patient presents with   Left Hip - Pain    HPI  Tracey Giles is a very pleasant 71 year old female who comes in for evaluation of chronic left hip and groin pain since Thanksgiving.  Denies any injuries or traumas.  She had a mild heart attack last week and spent a couple days in the hospital.  Denies any radicular symptoms or low back pain.  Review of Systems  Constitutional: Negative.   HENT: Negative.    Eyes: Negative.   Respiratory: Negative.    Cardiovascular: Negative.   Endocrine: Negative.   Musculoskeletal: Negative.   Neurological: Negative.   Hematological: Negative.   Psychiatric/Behavioral: Negative.    All other systems reviewed and are negative.   Objective: Vital Signs: There were no vitals taken for this visit.  Physical Exam Vitals and nursing note reviewed.  Constitutional:      Appearance: She is well-developed.  HENT:     Head: Normocephalic and atraumatic.  Pulmonary:     Effort: Pulmonary effort is normal.  Abdominal:      Palpations: Abdomen is soft.  Musculoskeletal:     Cervical back: Neck supple.  Skin:    General: Skin is warm.     Capillary Refill: Capillary refill takes less than 2 seconds.  Neurological:     Mental Status: She is alert and oriented to person, place, and time.  Psychiatric:        Behavior: Behavior normal.        Thought Content: Thought content normal.        Judgment: Judgment normal.    Ortho Exam  Examination left hip shows mild pain with hip range of motion and Stinchfield test.  No sciatic tension signs.  Lateral hip is nontender.  Specialty Comments:  No specialty comments available.  Imaging: No results found.   PMFS History: Patient Active Problem List   Diagnosis Date Noted   Non-ST elevation (NSTEMI) myocardial infarction (HCC) 12/14/2021   Chest pain at rest 12/12/2021   ACS (acute coronary syndrome) (HCC)    Abnormal reflex 10/18/2019   Numbness 10/18/2019   B12 deficiency 10/18/2019   Osteoporosis 01/04/2017   Exercise-induced asthma 11/20/2016   Upper airway cough syndrome 07/01/2016   Dyspnea 07/01/2016   Vitamin D insufficiency 05/24/2016   Hyperlipidemia 05/24/2016   Palpitations 11/15/2014   Meniere's disease 07/21/2007   GASTROESOPHAGEAL  REFLUX DISEASE, SEVERE 07/21/2007   Hypothyroidism 07/08/2007   Allergic rhinitis 07/08/2007   Past Medical History:  Diagnosis Date   Anxiety    Asthma    exercise induced asthma    Essential hypertension    GERD (gastroesophageal reflux disease)    Heart murmur    mitral valve prolapse   History of dermatitis    History of migraine headaches yrs ago, none recent   Hyperlipemia 05/24/2016   Hypothyroidism    Meniere's disease    NSTEMI (non-ST elevated myocardial infarction) (HCC)    a. s/p NSTEMI in 12/2021 with cath showing normal cors and felt to possibly be secondary to spasm.   Palpitations    Rare PVCs by Holter monitor April 2011    Sleep apnea    Reportedly mild, no cpap needed    Tingling    both legs saw dr Epimenio Foot 04-23-2020 no cause found   Vitamin D insufficiency 05/24/2016    Family History  Problem Relation Age of Onset   Melanoma Father 53   Stroke Mother    Asthma Mother    Stroke Maternal Grandfather    Asthma Brother    Deep vein thrombosis Brother 45       had back surgery with post op complications of blood clots which were fatal   Thyroid disease Neg Hx     Past Surgical History:  Procedure Laterality Date   APPENDECTOMY  as child   BREAST LUMPECTOMY  yrs ago   Benign   DILATATION & CURETTAGE/HYSTEROSCOPY WITH MYOSURE N/A 05/07/2020   Procedure: DILATATION & CURETTAGE/HYSTEROSCOPY WITH MYOSURE/POLYP REMOVAL;  Surgeon: Romualdo Bolk, MD;  Location: [facility];  Service: Gynecology;  Laterality: N/A;  polyp removal   LEFT HEART CATH AND CORONARY ANGIOGRAPHY N/A 12/12/2021   Procedure: LEFT HEART CATH AND CORONARY ANGIOGRAPHY;  Surgeon: Tonny Bollman, MD;  Location: [facility] INVAS[facility] CV LAB;  Service: Cardiovascular;  Laterality: N/A;   Social History   Occupational History   Occupation: Development worker, international aid: [employer]  Tobacco Use   Smoking status: Never   Smokeless tobacco: Never  Vaping Use   Vaping Use: Never used  Substance and Sexual Activity   Alcohol use: No   Drug use: No   Sexual activity: Not Currently

## 2022-01-26 ENCOUNTER — Ambulatory Visit: Payer: Medicare PPO | Admitting: Neurology

## 2022-02-09 ENCOUNTER — Other Ambulatory Visit: Payer: Self-pay | Admitting: Family Medicine

## 2022-03-03 ENCOUNTER — Telehealth: Payer: Self-pay | Admitting: Family Medicine

## 2022-03-03 ENCOUNTER — Other Ambulatory Visit (INDEPENDENT_AMBULATORY_CARE_PROVIDER_SITE_OTHER): Payer: Medicare PPO

## 2022-03-03 DIAGNOSIS — R030 Elevated blood-pressure reading, without diagnosis of hypertension: Secondary | ICD-10-CM | POA: Diagnosis not present

## 2022-03-03 DIAGNOSIS — E039 Hypothyroidism, unspecified: Secondary | ICD-10-CM

## 2022-03-03 DIAGNOSIS — E538 Deficiency of other specified B group vitamins: Secondary | ICD-10-CM | POA: Diagnosis not present

## 2022-03-03 DIAGNOSIS — E559 Vitamin D deficiency, unspecified: Secondary | ICD-10-CM | POA: Diagnosis not present

## 2022-03-03 DIAGNOSIS — E785 Hyperlipidemia, unspecified: Secondary | ICD-10-CM | POA: Diagnosis not present

## 2022-03-03 LAB — CBC WITH DIFFERENTIAL/PLATELET
Basophils Absolute: 0.1 10*3/uL (ref 0.0–0.1)
Basophils Relative: 1.2 % (ref 0.0–3.0)
Eosinophils Absolute: 0.3 10*3/uL (ref 0.0–0.7)
Eosinophils Relative: 4.9 % (ref 0.0–5.0)
HCT: 39.9 % (ref 36.0–46.0)
Hemoglobin: 13.2 g/dL (ref 12.0–15.0)
Lymphocytes Relative: 31.3 % (ref 12.0–46.0)
Lymphs Abs: 1.8 10*3/uL (ref 0.7–4.0)
MCHC: 33 g/dL (ref 30.0–36.0)
MCV: 93.4 fl (ref 78.0–100.0)
Monocytes Absolute: 0.5 10*3/uL (ref 0.1–1.0)
Monocytes Relative: 9 % (ref 3.0–12.0)
Neutro Abs: 3 10*3/uL (ref 1.4–7.7)
Neutrophils Relative %: 53.6 % (ref 43.0–77.0)
Platelets: 265 10*3/uL (ref 150.0–400.0)
RBC: 4.27 Mil/uL (ref 3.87–5.11)
RDW: 13.4 % (ref 11.5–15.5)
WBC: 5.7 10*3/uL (ref 4.0–10.5)

## 2022-03-03 LAB — COMPREHENSIVE METABOLIC PANEL
ALT: 8 U/L (ref 0–35)
AST: 17 U/L (ref 0–37)
Albumin: 4.1 g/dL (ref 3.5–5.2)
Alkaline Phosphatase: 63 U/L (ref 39–117)
BUN: 15 mg/dL (ref 6–23)
CO2: 27 mEq/L (ref 19–32)
Calcium: 9.4 mg/dL (ref 8.4–10.5)
Chloride: 107 mEq/L (ref 96–112)
Creatinine, Ser: 0.88 mg/dL (ref 0.40–1.20)
GFR: 66.54 mL/min (ref >60.00)
Glucose, Bld: 91 mg/dL (ref 70–99)
Potassium: 5 mEq/L (ref 3.5–5.1)
Sodium: 140 mEq/L (ref 135–145)
Total Bilirubin: 0.7 mg/dL (ref 0.2–1.2)
Total Protein: 6.8 g/dL (ref 6.0–8.3)

## 2022-03-03 LAB — LIPID PANEL
Cholesterol: 101 mg/dL (ref 0–200)
HDL: 50.1 mg/dL (ref >39.00)
LDL Cholesterol: 36 mg/dL (ref 0–99)
NonHDL: 51.37
Total CHOL/HDL Ratio: 2
Triglycerides: 78 mg/dL (ref 0.0–149.0)
VLDL: 15.6 mg/dL (ref 0.0–40.0)

## 2022-03-03 LAB — VITAMIN B12: Vitamin B-12: 1031 pg/mL — ABNORMAL HIGH (ref 211–911)

## 2022-03-03 LAB — TSH: TSH: 0.79 u[IU]/mL (ref 0.35–5.50)

## 2022-03-03 LAB — VITAMIN D 25 HYDROXY (VIT D DEFICIENCY, FRACTURES): VITD: 32.9 ng/mL (ref 30.00–100.00)

## 2022-03-03 LAB — FOLATE: Folate: >24.2 ng/mL (ref >5.9)

## 2022-03-03 NOTE — Telephone Encounter (Signed)
Pt walked in and stated she wanted to get her Hepatic Function Panel done here for labs a order that was placed by her Cardiologist Dr. So lab was wondering if Dr. Hassan Rowan can add the order in for it to be run so Tracey Giles doesn't have to be stuck again.  ? ?Please advise. ?

## 2022-03-04 ENCOUNTER — Other Ambulatory Visit: Payer: Self-pay | Admitting: Student

## 2022-03-04 MED ORDER — ATORVASTATIN CALCIUM 10 MG PO TABS: 10.0000 mg | ORAL_TABLET | Freq: Every day | ORAL | 3 refills | Status: DC

## 2022-03-04 NOTE — Telephone Encounter (Signed)
Patient informed of the message below.  Stated she sent a message to the cardiology office earlier today,  was told the results were viewed in Epic and no further testing was required. ?

## 2022-03-04 NOTE — Telephone Encounter (Signed)
They should have all they need from the cmp that was completed. If she needs forwarded to anyone, please do so. See results note. ?

## 2022-03-22 IMAGING — DX DG CHEST 1V PORT
1 series · 1 of 1 positions shown · non-contrast
Comparison: Radiograph 07/01/2016

CLINICAL DATA: Chest pain at rest

EXAM:
PORTABLE CHEST 1 VIEW

[chest]
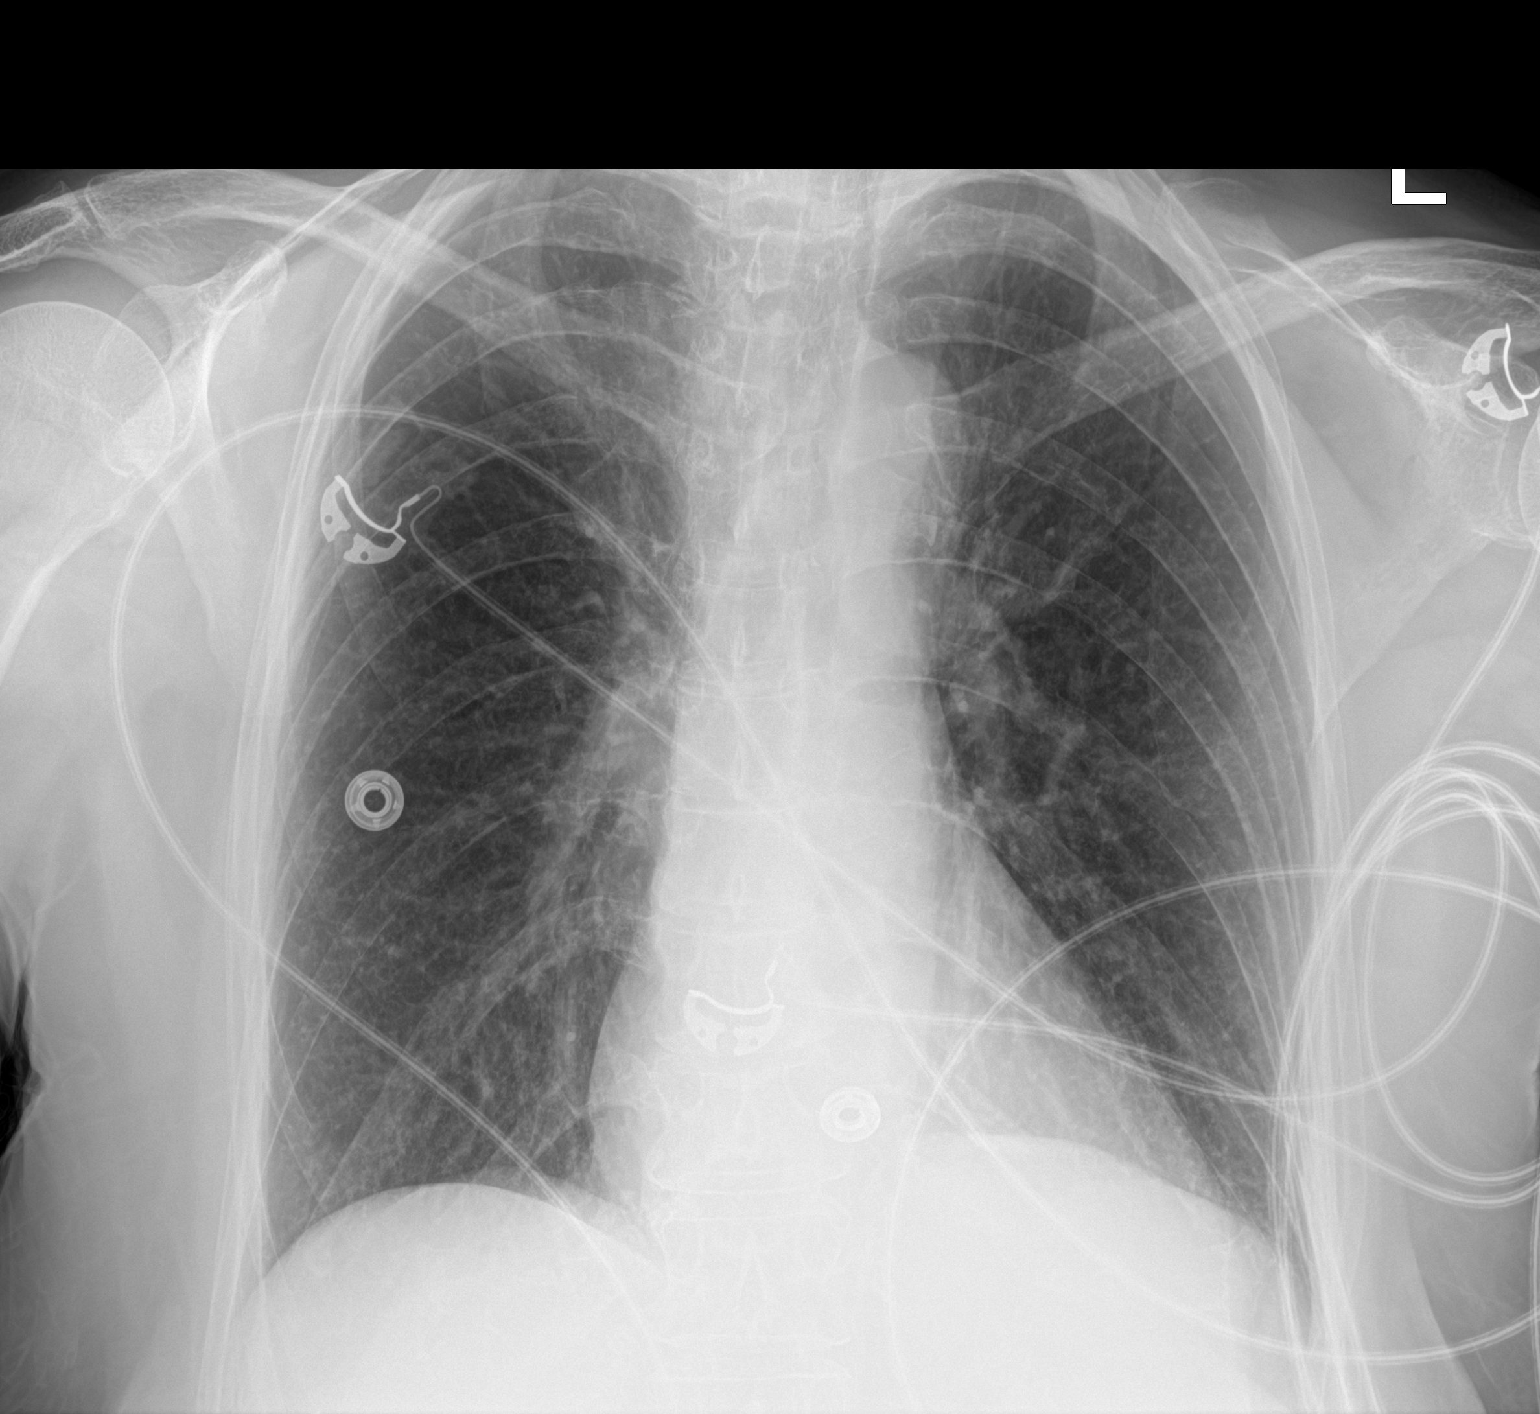

[1 of 1 positions shown; findings below may reference images not displayed]

FINDINGS: The cardiomediastinal contours are normal. Mild chronic
hyperinflation. Pulmonary vasculature is normal. No consolidation,
pleural effusion, or pneumothorax. No acute osseous abnormalities
are seen.
IMPRESSION: No acute abnormality. Mild chronic hyperinflation can be seen in the
setting of asthma.

## 2022-04-01 NOTE — Progress Notes (Signed)
? ?Cardiology Office Note   ? ?Date:  04/02/2022  ? ?ID:  Tracey FischerCatherine M Dechaine, DOB April 04, 1951, MRN [id number] ? ?PCP:  Tracey Giles, Junell C, MD  ?Cardiologist: Tracey DellSamuel McDowell, MD   ? ?Chief Complaint  ?Patient presents with  ? Follow-up  ?  3 month visit  ? ? ?History of Present Illness:   ? ?Tracey Giles is a 71 y.o. female with past medical history of CAD (s/p NSTEMI in 12/2021 with cath showing normal coronary arteries and felt to possibly be secondary to spasm), palpitations (PAC's and PVC's by prior monitor), HTN, GERD and asthma who presents to the office today for 572-month follow-up. ? ?She was last examined by myself in 12/2021 following her recent hospitalization for an NSTEMI during which cardiac catheterization showed patent coronary arteries with only mild irregularities and EF was preserved at 65%. At the time of her follow-up visit, she reported overall feeling well and denied any recurrent chest pain or palpitations. She did report muscle aches since hospital discharge and felt this might be secondary to Lipitor. She was continued on ASA, Toprol-XL 100 mg daily and Amlodipine 2.5 mg daily with Atorvastatin being reduced to 20 mg daily to see if this would help with her myalgias. She did reach out in the interim and reported still having myalgias, therefore Atorvastatin was further reduced to 10 mg daily as her LDL had improved to 36 when checked last month. ? ?In talking with the patient today, she reports overall doing well since her last office visit. She did have an episode of chest discomfort last week but this was not as severe as her prior spasm. She did take Pepcid with some improvement in symptoms and was not associated with exertion. She reports her breathing has been stable and denies any recent orthopnea, PND or pitting edema. She does experience occasional swelling along her ankles. Still has intermittent palpitations which are typically most notable at night. Myalgias have  resolved with reducing Atorvastatin to 10mg .  ? ? ?Past Medical History:  ?Diagnosis Date  ? Anxiety   ? Asthma   ? exercise induced asthma   ? Essential hypertension   ? GERD (gastroesophageal reflux disease)   ? Heart murmur   ? mitral valve prolapse  ? History of dermatitis   ? History of migraine headaches yrs ago, none recent  ? Hyperlipemia 05/24/2016  ? Hypothyroidism   ? Meniere's disease   ? NSTEMI (non-ST elevated myocardial infarction) (HCC)   ? a. s/p NSTEMI in 12/2021 with cath showing normal cors and felt to possibly be secondary to spasm.  ? Palpitations   ? Rare PVCs by Holter monitor April 2011   ? Sleep apnea   ? Reportedly mild, no cpap needed  ? Tingling   ? both legs saw dr Epimenio Footsater 04-23-2020 no cause found  ? Vitamin D insufficiency 05/24/2016  ? ? ?Past Surgical History:  ?Procedure Laterality Date  ? APPENDECTOMY  as child  ? BREAST LUMPECTOMY  yrs ago  ? Benign  ? DILATATION & CURETTAGE/HYSTEROSCOPY WITH MYOSURE N/A 05/07/2020  ? Procedure: DILATATION & CURETTAGE/HYSTEROSCOPY WITH MYOSURE/POLYP REMOVAL;  Surgeon: Tracey Giles, Tracey Evelyn, MD;  Location: [facility] [place];  Service: Gynecology;  Laterality: N/A;  polyp removal  ? LEFT HEART CATH AND CORONARY ANGIOGRAPHY N/A 12/12/2021  ? Procedure: LEFT HEART CATH AND CORONARY ANGIOGRAPHY;  Surgeon: Tracey Giles, Michael, MD;  [facility];  Service: Cardiovascular;  Laterality: N/A;  ? ? ?Current Medications: ?Outpatient Medications  Prior to Visit  ?Medication Sig Dispense Refill  ? acetaminophen (TYLENOL) 500 MG tablet Take 1,000 mg by mouth every 6 (six) hours as needed (pain).    ? albuterol (VENTOLIN HFA) 108 (90 Base) MCG/ACT inhaler Inhale 2 puffs into the lungs every 6 (six) hours as needed. (Patient taking differently: Inhale 2 puffs into the lungs every 6 (six) hours as needed for wheezing or shortness of breath.) 8.5 g 1  ? amLODipine (NORVASC) 2.5 MG tablet Take 1 tablet (2.5 mg total) by mouth daily. 90 tablet 3  ?  aspirin EC 81 MG EC tablet Take 1 tablet (81 mg total) by mouth daily. Swallow whole. 90 tablet 2  ? atorvastatin (LIPITOR) 10 MG tablet Take 1 tablet (10 mg total) by mouth daily. 90 tablet 3  ? cholecalciferol (VITAMIN D) 25 MCG (1000 UNIT) tablet Take 1,000 Units by mouth every morning.    ? Cyanocobalamin (B-12) 1000 MCG TABS Take 1,000 mcg by mouth every morning.    ? FOLIC ACID PO Take 1 tablet by mouth every morning.    ? levocetirizine (XYZAL) 5 MG tablet Take 1 tablet (5 mg total) by mouth every evening. 30 tablet 5  ? LORazepam (ATIVAN) 0.5 MG tablet Take 0.5-1 tablets (0.25-0.5 mg total) by mouth every 8 (eight) hours as needed for anxiety. 10 tablet 0  ? metoprolol succinate (TOPROL-XL) 100 MG 24 hr tablet Take 1 tablet (100 mg total) by mouth daily after supper. 90 tablet 1  ? omeprazole (PRILOSEC OTC) 20 MG tablet Take 20 mg by mouth daily as needed (acid reflux/heartburn).    ? SYNTHROID 75 MCG tablet Take 1 tablet (75 mcg total) by mouth daily before breakfast. 90 tablet 1  ? ?No facility-administered medications prior to visit.  ?  ? ?Allergies:   Tums [calcium carbonate antacid], Cephalexin, Penicillins, and Sulfamethoxazole-trimethoprim  ? ?Social History  ? ?Socioeconomic History  ? Marital status: Married  ?  Spouse name: [name]  ? Number of children: 0  ? Years of education: 56  ? Highest education level: Associate degree: occupational, Scientist, product/process development, or vocational program  ?Occupational History  ? Occupation: [employer]  ?  Employer: [employer]  ?Tobacco Use  ? Smoking status: Never  ? Smokeless tobacco: Never  ?Vaping Use  ? Vaping Use: Never used  ?Substance and Sexual Activity  ? Alcohol use: No  ? Drug use: No  ? Sexual activity: Not Currently  ?Other Topics Concern  ? Not on file  ?Social History Narrative  ? Work or School: retired - used to work for [employer]  ?   ? Home Situation: lives with husband  ?   ? Spiritual Beliefs: Christian  ?   ? Lifestyle: no regular exercise;  diet is healthy  ?   ? Right handed   ? No caffeine use:   ? ?Social Determinants of Health  ? ?Financial Resource Strain: Low Risk   ? Difficulty of Paying Living Expenses: Not hard at all  ?Food Insecurity: No Food Insecurity  ? Worried About Programme researcher, broadcasting/film/video in the Last Year: Never true  ? Ran Out of Food in the Last Year: Never true  ?Transportation Needs: No Transportation Needs  ? Lack of Transportation (Medical): No  ? Lack of Transportation (Non-Medical): No  ?Physical Activity: Insufficiently Active  ? Days of Exercise per Week: 3 days  ? Minutes of Exercise per Session: 20 min  ?Stress: No Stress Concern Present  ? Feeling of Stress :  Not at all  ?Social Connections: Socially Integrated  ? Frequency of Communication with Friends and Family: More than three times a week  ? Frequency of Social Gatherings with Friends and Family: Patient refused  ? Attends Religious Services: More than 4 times per year  ? Active Member of Clubs or Organizations: Yes  ? Attends Banker Meetings: More than 4 times per year  ? Marital Status: Married  ?  ? ?Family History:  The patient's family history includes Asthma in her brother and mother; Deep vein thrombosis (age of onset: 77) in her brother; Melanoma (age of onset: 3) in her father; Stroke in her maternal grandfather and mother.  ? ?Review of Systems:   ? ?Please see the history of present illness.    ? ?All other systems reviewed and are otherwise negative except as noted above. ? ? ?Physical Exam:   ? ?VS:  BP 132/78   Pulse 60   Ht 5\' 7"  (1.702 m)   Wt 134 lb 3.2 oz (60.9 kg)   SpO2 100%   BMI 21.02 kg/m?    ?General: Well developed, well nourished,female appearing in no acute distress. ?Head: Normocephalic, atraumatic. ?Neck: No carotid bruits. JVD not elevated.  ?Lungs: Respirations regular and unlabored, without wheezes or rales.  ?Heart: Regular rate and rhythm. No S3 or S4.  No murmur, no rubs, or gallops appreciated. ?Abdomen: Appears  non-distended. No obvious abdominal masses. ?Msk:  Strength and tone appear normal for age. No obvious joint deformities or effusions. ?Extremities: No clubbing or cyanosis. No pitting edema.  Distal pedal pulses are 2+ bilat

## 2022-04-02 ENCOUNTER — Encounter: Payer: Self-pay | Admitting: Student

## 2022-04-02 ENCOUNTER — Ambulatory Visit: Payer: Medicare PPO | Admitting: Student

## 2022-04-02 VITALS — BP 132/78 | HR 60 | Ht 67.0 in | Wt 134.2 lb

## 2022-04-02 DIAGNOSIS — R002 Palpitations: Secondary | ICD-10-CM

## 2022-04-02 DIAGNOSIS — E785 Hyperlipidemia, unspecified: Secondary | ICD-10-CM | POA: Diagnosis not present

## 2022-04-02 DIAGNOSIS — I201 Angina pectoris with documented spasm: Secondary | ICD-10-CM

## 2022-04-02 NOTE — Patient Instructions (Signed)
Medication Instructions:  ? ?Continue current medication regimen. You can take an extra Amlodipine tablet if you experience symptoms resembling your spasm.  ? ?*If you need a refill on your cardiac medications before your next appointment, please call your pharmacy* ? ? ?Lab Work: ? ?Fasting Lipid Panel in 3 months.  ? ?If you have labs (blood work) drawn today and your tests are completely normal, you will receive your results only by: ?MyChart Message (if you have MyChart) OR ?A paper copy in the mail ?If you have any lab test that is abnormal or we need to change your treatment, we will call you to review the results. ? ? ?Follow-Up: ?At [facility], you and your health needs are our priority.  As part of our continuing mission to provide you with exceptional heart care, we have created designated Provider Care Teams.  These Care Teams include your primary Cardiologist (physician) and Advanced Practice Providers (APPs -  Physician Assistants and Nurse Practitioners) who all work together to provide you with the care you need, when you need it. ? ?We recommend signing up for the patient portal called "MyChart".  Sign up information is provided on this After Visit Summary.  MyChart is used to connect with patients for Virtual Visits (Telemedicine).  Patients are able to view lab/test results, encounter notes, upcoming appointments, etc.  Non-urgent messages can be sent to your provider as well.   ?To learn more about what you can do with MyChart, go to [email or web address].   ? ?Your next appointment:   ?6 month(s) ? ?The format for your next appointment:   ?In Person ? ?Provider:   ?You may see Nona Dell, MD or one of the following Advanced Practice Providers on your designated Care Team:   ?Randall An, PA-C  ?Jacolyn Reedy, PA-C { ? ?Important Information About Sugar ? ? ? ? ? ? ?

## 2022-04-24 ENCOUNTER — Ambulatory Visit (INDEPENDENT_AMBULATORY_CARE_PROVIDER_SITE_OTHER): Payer: Medicare PPO

## 2022-04-24 ENCOUNTER — Ambulatory Visit: Payer: Medicare PPO | Admitting: Family Medicine

## 2022-04-24 ENCOUNTER — Encounter: Payer: Self-pay | Admitting: Family Medicine

## 2022-04-24 VITALS — BP 118/80 | HR 59 | Temp 98.0°F | Ht 67.0 in | Wt 133.0 lb

## 2022-04-24 DIAGNOSIS — M25511 Pain in right shoulder: Secondary | ICD-10-CM | POA: Diagnosis not present

## 2022-04-24 DIAGNOSIS — R350 Frequency of micturition: Secondary | ICD-10-CM

## 2022-04-24 DIAGNOSIS — M19011 Primary osteoarthritis, right shoulder: Secondary | ICD-10-CM | POA: Diagnosis not present

## 2022-04-24 DIAGNOSIS — M79621 Pain in right upper arm: Secondary | ICD-10-CM | POA: Diagnosis not present

## 2022-04-24 NOTE — Addendum Note (Signed)
Addended by: Elita Boone E on: 04/24/2022 03:24 PM   Modules accepted: Orders

## 2022-04-24 NOTE — Progress Notes (Signed)
Tracey Giles [facility] DOB: July 25, 1951 Encounter date: 04/24/2022  This is a 71 y.o. female who presents with Chief Complaint  Patient presents with   Urinary Frequency    X2 months, denies pain and only frequency    History of present illness:  Does ok during the day, but as soon as laying down at night feels like urgency keeps building up. Uses up to 6 times at night. At least twice a night. Not going a lot during the day. Just once she relaxes. No burning, no abd pain, no fevers.   She doesn't drink caffeine. Stops drinking at 5pm, but doesn't seem to help. Only thing she takes at night is statin. No urgency or incontinence during the day.   Bowel movements have been harder past few weeks. Noted this as well when she started statin.   Does feel like she empties bladder completely when she goes.     Allergies  Allergen Reactions   Tums [Calcium Carbonate Antacid] Hives    Burning sensation in mouth, rash on torso   Cephalexin Itching   Penicillins Itching   Sulfamethoxazole-Trimethoprim Hives   Current Meds  Medication Sig   acetaminophen (TYLENOL) 500 MG tablet Take 1,000 mg by mouth every 6 (six) hours as needed (pain).   albuterol (VENTOLIN HFA) 108 (90 Base) MCG/ACT inhaler Inhale 2 puffs into the lungs every 6 (six) hours as needed. (Patient taking differently: Inhale 2 puffs into the lungs every 6 (six) hours as needed for wheezing or shortness of breath.)   amLODipine (NORVASC) 2.5 MG tablet Take 1 tablet (2.5 mg total) by mouth daily.   aspirin EC 81 MG EC tablet Take 1 tablet (81 mg total) by mouth daily. Swallow whole.   atorvastatin (LIPITOR) 10 MG tablet Take 1 tablet (10 mg total) by mouth daily.   cholecalciferol (VITAMIN D) 25 MCG (1000 UNIT) tablet Take 1,000 Units by mouth every morning.   Cyanocobalamin (B-12) 1000 MCG TABS Take 1,000 mcg by mouth every morning.   FOLIC ACID PO Take 1 tablet by mouth every morning.   levocetirizine (XYZAL) 5 MG tablet Take  1 tablet (5 mg total) by mouth every evening.   LORazepam (ATIVAN) 0.5 MG tablet Take 0.5-1 tablets (0.25-0.5 mg total) by mouth every 8 (eight) hours as needed for anxiety.   metoprolol succinate (TOPROL-XL) 100 MG 24 hr tablet Take 1 tablet (100 mg total) by mouth daily after supper.   omeprazole (PRILOSEC OTC) 20 MG tablet Take 20 mg by mouth daily as needed (acid reflux/heartburn).   SYNTHROID 75 MCG tablet Take 1 tablet (75 mcg total) by mouth daily before breakfast.    Review of Systems  Objective:  BP 118/80 (BP Location: Left Arm, Patient Position: Sitting, Cuff Size: Normal)   Pulse (!) 59   Temp 98 F (36.7 C) (Oral)   Ht 5\' 7"  (1.702 m)   Wt 133 lb (60.3 kg)   SpO2 100%   BMI 20.83 kg/m   Weight: 133 lb (60.3 kg)   BP Readings from Last 3 Encounters:  04/24/22 118/80  04/02/22 132/78  01/02/22 126/80   Wt Readings from Last 3 Encounters:  04/24/22 133 lb (60.3 kg)  04/02/22 134 lb 3.2 oz (60.9 kg)  01/02/22 135 lb (61.2 kg)    Physical Exam Constitutional:      General: She is not in acute distress.    Appearance: She is well-developed.  Cardiovascular:     Rate and Rhythm: Normal rate and regular rhythm.  Heart sounds: Normal heart sounds. No murmur heard.   No friction rub.  Pulmonary:     Effort: Pulmonary effort is normal. No respiratory distress.     Breath sounds: Normal breath sounds. No wheezing or rales.  Musculoskeletal:     Right lower leg: No edema.     Left lower leg: No edema.     Comments: Abduction limited to 90 decrease, pain with posterior movement. No muscle weakness of rotator cuff, pain with passive and active motion. No bony tenderness.  Neurological:     Mental Status: She is alert and oriented to person, place, and time.  Psychiatric:        Behavior: Behavior normal.    Assessment/Plan  1. Urinary frequency We will get baseline urine study, but I suspect this is more of a fluid distribution issue, perhaps with the  amlodipine. Suspect that she is accumulating some fluid that with her lying down at night is able to redistribute and causes increased urination. She will try taking amlodipine at night, consider compression stockings, and then talk with cardiology if not improving. May be another CCB that causes less swelling.  - Urinalysis with Reflex Microscopic - Urine Culture; Future  2. Acute pain of right shoulder Start with xray; consider sports med or PT pending result. - DG Shoulder Right; Future   AVS: *work on elevating legs while sitting during the day to hep prevent swelling *consider compression stockings during the day to help control swelling *if swelling not improving, talk with cardiology about considering switching up medications *ok to try taking lipitor in morning and amlodipine in evening to see if this helps with urinary frequency.    Tracey Shove, MD

## 2022-04-24 NOTE — Patient Instructions (Addendum)
*  work on elevating legs while sitting during the day to hep prevent swelling *consider compression stockings during the day to help control swelling *if swelling not improving, talk with cardiology about considering switching up medications *ok to try taking lipitor in morning and amlodipine in evening to see if this helps with urinary frequency. If that isn't working, could also try breaking amlodipine in half to see if this decreasing swelling. *compression stockings through nursing supply - [employer] [facility] supply (or similar). MOJO (brand on [place]) 79mmHg+

## 2022-04-25 LAB — URINE CULTURE
MICRO NUMBER:: 13420582
SPECIMEN QUALITY:: ADEQUATE

## 2022-04-25 LAB — URINALYSIS, ROUTINE W REFLEX MICROSCOPIC
Bilirubin Urine: NEGATIVE
Glucose, UA: NEGATIVE
Hgb urine dipstick: NEGATIVE
Ketones, ur: NEGATIVE
Leukocytes,Ua: NEGATIVE
Nitrite: NEGATIVE
Protein, ur: NEGATIVE
Specific Gravity, Urine: 1.03 (ref 1.001–1.035)
pH: 5.5 (ref 5.0–8.0)

## 2022-06-15 NOTE — Progress Notes (Unsigned)
    Aleen Sells D.[facility] [address] [place] [address] Phone: [phone]   Assessment and Plan:     There are no diagnoses linked to this encounter.  ***   Pertinent previous records reviewed include ***   Follow Up: ***     Subjective:   I, Tracey Giles, am serving as a Neurosurgeon for Doctor Richardean Sale  Chief Complaint: right shoulder pain   HPI:   06/16/2022 Patient is a 71 year old female complaining of right shoulder pain. Patient states  Relevant Historical Information: ***  Additional pertinent review of systems negative.   Current Outpatient Medications:    acetaminophen (TYLENOL) 500 MG tablet, Take 1,000 mg by mouth every 6 (six) hours as needed (pain)., Disp: , Rfl:    albuterol (VENTOLIN HFA) 108 (90 Base) MCG/ACT inhaler, Inhale 2 puffs into the lungs every 6 (six) hours as needed. (Patient taking differently: Inhale 2 puffs into the lungs every 6 (six) hours as needed for wheezing or shortness of breath.), Disp: 8.5 g, Rfl: 1   amLODipine (NORVASC) 2.5 MG tablet, Take 1 tablet (2.5 mg total) by mouth daily., Disp: 90 tablet, Rfl: 3   aspirin EC 81 MG EC tablet, Take 1 tablet (81 mg total) by mouth daily. Swallow whole., Disp: 90 tablet, Rfl: 2   atorvastatin (LIPITOR) 10 MG tablet, Take 1 tablet (10 mg total) by mouth daily., Disp: 90 tablet, Rfl: 3   cholecalciferol (VITAMIN D) 25 MCG (1000 UNIT) tablet, Take 1,000 Units by mouth every morning., Disp: , Rfl:    Cyanocobalamin (B-12) 1000 MCG TABS, Take 1,000 mcg by mouth every morning., Disp: , Rfl:    FOLIC ACID PO, Take 1 tablet by mouth every morning., Disp: , Rfl:    levocetirizine (XYZAL) 5 MG tablet, Take 1 tablet (5 mg total) by mouth every evening., Disp: 30 tablet, Rfl: 5   LORazepam (ATIVAN) 0.5 MG tablet, Take 0.5-1 tablets (0.25-0.5 mg total) by mouth every 8 (eight) hours as needed for anxiety., Disp: 10 tablet, Rfl: 0   metoprolol succinate  (TOPROL-XL) 100 MG 24 hr tablet, Take 1 tablet (100 mg total) by mouth daily after supper., Disp: 90 tablet, Rfl: 1   omeprazole (PRILOSEC OTC) 20 MG tablet, Take 20 mg by mouth daily as needed (acid reflux/heartburn)., Disp: , Rfl:    SYNTHROID 75 MCG tablet, Take 1 tablet (75 mcg total) by mouth daily before breakfast., Disp: 90 tablet, Rfl: 1   Objective:     There were no vitals filed for this visit.    There is no height or weight on file to calculate BMI.    Physical Exam:    ***   Electronically signed by:  Aleen Sells D.[facility] 9:58 AM 06/15/22

## 2022-06-16 ENCOUNTER — Ambulatory Visit: Payer: Medicare PPO | Admitting: Sports Medicine

## 2022-06-16 VITALS — BP 100/78 | HR 59 | Ht 67.0 in | Wt 133.0 lb

## 2022-06-16 DIAGNOSIS — M7551 Bursitis of right shoulder: Secondary | ICD-10-CM

## 2022-06-16 DIAGNOSIS — M25511 Pain in right shoulder: Secondary | ICD-10-CM | POA: Diagnosis not present

## 2022-06-16 NOTE — Patient Instructions (Addendum)
Good to see you  Shoulder HEP Pt referral  4-5 week follow up

## 2022-07-02 ENCOUNTER — Other Ambulatory Visit: Payer: Self-pay

## 2022-07-02 ENCOUNTER — Ambulatory Visit: Payer: Medicare PPO | Attending: Sports Medicine

## 2022-07-02 DIAGNOSIS — M25611 Stiffness of right shoulder, not elsewhere classified: Secondary | ICD-10-CM | POA: Insufficient documentation

## 2022-07-02 DIAGNOSIS — M7551 Bursitis of right shoulder: Secondary | ICD-10-CM | POA: Diagnosis not present

## 2022-07-02 DIAGNOSIS — R293 Abnormal posture: Secondary | ICD-10-CM | POA: Diagnosis present

## 2022-07-02 DIAGNOSIS — G8929 Other chronic pain: Secondary | ICD-10-CM | POA: Insufficient documentation

## 2022-07-02 DIAGNOSIS — M25511 Pain in right shoulder: Secondary | ICD-10-CM | POA: Insufficient documentation

## 2022-07-02 NOTE — Therapy (Signed)
OUTPATIENT PHYSICAL THERAPY SHOULDER EVALUATION   Patient Name: Tracey Giles MRN: [id number] DOB:1951/06/24, 71 y.o., female Today's Date: 07/02/2022   PT End of Session - 07/02/22 1703     Visit Number 1    Number of Visits 13    Date for PT Re-Evaluation 08/20/22    Authorization Type [employer] MEDICARE CHOICE PPO    Progress Note Due on Visit 10    PT Start Time 1330    PT Stop Time 1415    PT Time Calculation (min) 45 min    Activity Tolerance Patient tolerated treatment well    Behavior During Therapy WFL for tasks assessed/performed             Past Medical History:  Diagnosis Date   Anxiety    Asthma    exercise induced asthma    Essential hypertension    GERD (gastroesophageal reflux disease)    Heart murmur    mitral valve prolapse   History of dermatitis    History of migraine headaches yrs ago, none recent   Hyperlipemia 05/24/2016   Hypothyroidism    Meniere's disease    NSTEMI (non-ST elevated myocardial infarction) ([place])    a. s/p NSTEMI in 12/2021 with cath showing normal cors and felt to possibly be secondary to spasm.   Palpitations    Rare PVCs by Holter monitor April 2011    Sleep apnea    Reportedly mild, no cpap needed   Tingling    both legs saw dr Felecia Shelling 04-23-2020 no cause found   Vitamin D insufficiency 05/24/2016   Past Surgical History:  Procedure Laterality Date   APPENDECTOMY  as child   BREAST LUMPECTOMY  yrs ago   [place] N/A 05/07/2020   Procedure: DILATATION & CURETTAGE/HYSTEROSCOPY WITH MYOSURE/POLYP REMOVAL;  Surgeon: Salvadore Dom, MD;  Location: [facility];  Service: Gynecology;  Laterality: N/A;  polyp removal   LEFT HEART CATH AND CORONARY ANGIOGRAPHY N/A 12/12/2021   Procedure: LEFT HEART CATH AND CORONARY ANGIOGRAPHY;  Surgeon: Sherren Mocha, MD;  Location: [facility];  Service: Cardiovascular;  Laterality: N/A;   Patient Active  Problem List   Diagnosis Date Noted   Non-ST elevation (NSTEMI) myocardial infarction ([place]) 12/14/2021   Chest pain at rest 12/12/2021   ACS (acute coronary syndrome) ([place])    Abnormal reflex 10/18/2019   Numbness 10/18/2019   B12 deficiency 10/18/2019   Osteoporosis 01/04/2017   Exercise-induced asthma 11/20/2016   Upper airway cough syndrome 07/01/2016   Dyspnea 07/01/2016   Vitamin D insufficiency 05/24/2016   Hyperlipidemia 05/24/2016   Palpitations 11/15/2014   Meniere's disease 07/21/2007   GASTROESOPHAGEAL REFLUX DISEASE, SEVERE 07/21/2007   Hypothyroidism 07/08/2007   Allergic rhinitis 07/08/2007    PCP: Caren Macadam, MD  REFERRING PROVIDER: Glennon Mac, DO  REFERRING DIAG: M75.51 (ICD-10-CM) - Subacromial bursitis of right shoulder joint  THERAPY DIAG:  Chronic pain in right shoulder  Stiffness of right shoulder, not elsewhere classified  Abnormal posture  Rationale for Evaluation and Treatment Rehabilitation  ONSET DATE: 6 months ago  SUBJECTIVE:  SUBJECTIVE STATEMENT: MOI: Woke up one AM with lateral R arm pain and difficulty moving it. Currently, sleeps on L side. Now able to use R hand below chest level, has pain with a straightening R arm or lifting above her shoulder.  PERTINENT HISTORY: NSTEMI, LEFT HEART CATH AND CORONARY ANGIOGRAPHY, Meniere's disease  PAIN:  Are you having pain? Yes: NPRS scale: 5/10 Pain location: R lateral upper arm Pain description: achy cramp Aggravating factors: Move my R arm the wrong way Relieving factors: Rest, medication Pain range: 0-10/10  PRECAUTIONS: None  WEIGHT BEARING RESTRICTIONS No  FALLS:  Has patient fallen in last 6 months? No  LIVING ENVIRONMENT: Lives with: lives with their family Lives in:  House/apartment No issue with accessing or mobility within home  OCCUPATION: Retired  PLOF: Independent  PATIENT GOALS To use my R arm without pain  OBJECTIVE:   DIAGNOSTIC FINDINGS:  Xray R shoulder: 04/26/22 IMPRESSION: Very mild glenohumeral and acromioclavicular osteoarthritis.  PATIENT SURVEYS:  FOTO 51% perceived LOF; predicted 65%  COGNITION:  Overall cognitive status: Within functional limits for tasks assessed     SENSATION: WFL  POSTURE: Forward head, rounded shoulders, increased kyphosis, R lateral shift of shoulders  UPPER EXTREMITY ROM:   Active ROM Right eval Left eval  Shoulder flexion 90 125  Shoulder extension    Shoulder abduction    Shoulder adduction    Shoulder internal rotation    Shoulder external rotation    Elbow flexion    Elbow extension    Wrist flexion    Wrist extension    Wrist ulnar deviation    Wrist radial deviation    Wrist pronation    Wrist supination    (Blank rows = not tested)  UPPER EXTREMITY MMT:   - R shoulder strength found WNLs c only mild provocation of pain MMT Right eval Left eval  Shoulder flexion 4+   Shoulder extension 4+   Shoulder abduction 4+   Shoulder adduction 4+   Shoulder internal rotation 4+   Shoulder external rotation 4+   Middle trapezius    Lower trapezius    Elbow flexion    Elbow extension    Wrist flexion    Wrist extension    Wrist ulnar deviation    Wrist radial deviation    Wrist pronation    Wrist supination    Grip strength (lbs)    (Blank rows = not tested)  SHOULDER SPECIAL TESTS:  Impingement tests: Hawkins/Kennedy impingement test: negative  Rotator cuff assessment: Empty can test: negative and Full can test: negative  Biceps assessment: Yergason's test: negative  PALPATION:  TTP lateral GH area   TODAY'S TREATMENT:  Cervical Retraction  5 reps - 3 hold   Scapular Retraction  5 reps - 3 hold Standing Shoulder Row with Anchored Resistance 10 reps - 2 hold  GTB Shoulder extension with resistance 10 reps - 2 hold GTB Seated Upper Trapezius Stretch 3 reps - 30 hold   PATIENT EDUCATION: Education details: Eval findings, POC, , proper posture, pillow for R UE c L SL sleeping Person educated: Patient Education method: Explanation, Demonstration, Tactile cues, Verbal cues, and Handouts Education comprehension: verbalized understanding, returned demonstration, verbal cues required, and tactile cues required   HOME EXERCISE PROGRAM: Access Code: [email or web address] URL: [email or web address] Date: 07/02/2022 Prepared by: Joellyn Rued  Exercises - Seated Passive Cervical Retraction  - 6 x daily - 7 x weekly - 1 sets - 5 reps - 3 hold - Seated Scapular Retraction  -  6 x daily - 7 x weekly - 1 sets - 5 reps - 3 hold - Standing Shoulder Row with Anchored Resistance  - 2 x daily - 7 x weekly - 2 sets - 10 reps - 2 hold - Shoulder extension with resistance - Neutral  - 2 x daily - 7 x weekly - 2 sets - 10 reps - 2 hold - Seated Upper Trapezius Stretch  - 2 x daily - 7 x weekly - 1 sets - 3 reps - 30 hold  ASSESSMENT:  CLINICAL IMPRESSION: Patient is a 71 y.o. female who was seen today for physical therapy evaluation and treatment for Subacromial bursitis of right shoulder joint.    OBJECTIVE IMPAIRMENTS decreased ROM, decreased strength, impaired UE functional use, postural dysfunction, and pain.   ACTIVITY LIMITATIONS carrying, lifting, dressing, and caring for others  PARTICIPATION LIMITATIONS: meal prep, cleaning, laundry, driving, shopping, and yard work  PERSONAL FACTORS Past/current experiences and Time since onset of injury/illness/exacerbation are also affecting patient's functional outcome.   REHAB POTENTIAL: Good  CLINICAL DECISION MAKING: Stable/uncomplicated  EVALUATION COMPLEXITY: Low   GOALS:  SHORT TERM GOALS: = LTGs   LONG TERM GOALS: Target 08/1522  Pt will demonstrate 120d of R shoulder flexion for improved R UE  function c above shoulder height reaching Baseline: 90d Goal status: INITIAL  2.  Pt will report a decrease in R shoulder pain with daily activities to 0-3/10 for improved L UE function Baseline: ave. 5/10, 0-10/10 pain range Goal status: INITIAL  3.  Pt's FOTO perceived function score will increase to 65%  Baseline: 51% Goal status: INITIAL  4.  Pt will be ind in a final HEP to maintain achieved LOF Baseline: started on eval Goal status: INITIAL   PLAN: PT FREQUENCY: 2x/week  PT DURATION: 6 weeks  PLANNED INTERVENTIONS: Therapeutic exercises, Therapeutic activity, Patient/Family education, Self Care, Joint mobilization, Aquatic Therapy, Dry Needling, Electrical stimulation, Cryotherapy, Moist heat, Taping, Vasopneumatic device, Ultrasound, Ionotophoresis 4mg /ml Dexamethasone, Manual therapy, and Re-evaluation  PLAN FOR NEXT SESSION: Review FOTO, assess response to HEP, use of modalities, manual therapy and TPDN as indicated    [employer] MS, PT 07/02/22 5:20 PM  Referring diagnosis? M75.51 (ICD-10-CM) - Subacromial bursitis of right shoulder joint  Treatment diagnosis? (if different than referring diagnosis) Chronic pain in right shoulder, Stiffness of right shoulder, not elsewhere classified, Abnormal posture  What was this (referring dx) caused by? []  Surgery []  Fall [x]  Ongoing issue []  Arthritis []  Other: ____________  Laterality: [x]  Rt []  Lt []  Both  Check all possible CPT codes:  *CHOOSE 10 OR LESS*    [x]  97110 (Therapeutic Exercise)  []  92507 (SLP Treatment)  [x]  97112 (Neuro Re-ed)   []  92526 (Swallowing Treatment)   [x]  97116 (Gait Training)   []  [id number] (Cognitive Training, 1st 15 minutes) [x]  97140 (Manual Therapy)   []  97130 (Cognitive Training, each add'l 15 minutes)  [x]  97164 (Re-evaluation)                              []  Other, List CPT Code ____________  [x]  [id number] (Therapeutic Activities)     [x]  97535 (Self Care)   []  All codes above (97110 -  97535)  []  97012 (Mechanical Traction)  [x]  97014 (E-stim Unattended)  [x]  97032 (E-stim manual)  [x]  97033 (Ionto)  [x]  97035 (Ultrasound) []  97750 (Physical Performance Training) [x]  [id number] (Aquatic Therapy) [x]  97016 (Vasopneumatic Device) []  [id number] (Paraffin) []   90240 (Contrast Bath) []  [phone] (Wound Care 1st 20 sq cm) []  97353 (Wound Care each add'l 20 sq cm) []  97760 (Orthotic Fabrication, Fitting, Training Initial) []  (Prosthetic Management and Training Initial) []  29924 (Orthotic or Prosthetic Training/ Modification Subsequent)

## 2022-07-03 ENCOUNTER — Other Ambulatory Visit (HOSPITAL_COMMUNITY)
Admission: RE | Admit: 2022-07-03 | Discharge: 2022-07-03 | Disposition: A | Discharge status: Home / Self Care | Payer: Medicare PPO | Source: Ambulatory Visit | Attending: Student | Admitting: Student

## 2022-07-03 DIAGNOSIS — I201 Angina pectoris with documented spasm: Secondary | ICD-10-CM | POA: Diagnosis present

## 2022-07-03 DIAGNOSIS — E785 Hyperlipidemia, unspecified: Secondary | ICD-10-CM | POA: Insufficient documentation

## 2022-07-03 LAB — LIPID PANEL
Cholesterol: 135 mg/dL (ref 0–200)
HDL: 60 mg/dL (ref >40)
LDL Cholesterol: 55 mg/dL (ref 0–99)
Total CHOL/HDL Ratio: 2.3 RATIO
Triglycerides: 98 mg/dL (ref <150)
VLDL: 20 mg/dL (ref 0–40)

## 2022-07-06 ENCOUNTER — Ambulatory Visit: Payer: Medicare PPO | Admitting: Physical Therapy

## 2022-07-06 DIAGNOSIS — M25611 Stiffness of right shoulder, not elsewhere classified: Secondary | ICD-10-CM

## 2022-07-06 DIAGNOSIS — M25511 Pain in right shoulder: Secondary | ICD-10-CM | POA: Diagnosis not present

## 2022-07-06 DIAGNOSIS — G8929 Other chronic pain: Secondary | ICD-10-CM

## 2022-07-06 DIAGNOSIS — R293 Abnormal posture: Secondary | ICD-10-CM

## 2022-07-06 NOTE — Therapy (Addendum)
OUTPATIENT PHYSICAL THERAPY TREATMENT NOTE   Patient Name: Tracey Giles MRN: [id number] DOB:1951-04-30, 71 y.o., female Today's Date: 07/06/2022  PCP: Dr. [name]   REFERRING PROVIDER: Richardean Sale, DO   END OF SESSION:   PT End of Session - 07/06/22 1329     Visit Number 2    Number of Visits 13    Date for PT Re-Evaluation 08/20/22    Authorization Type [employer] MED[employer] CHOICE PPO    Progress Note Due on Visit 10    PT Start Time 1326    PT Stop Time 1410    PT Time Calculation (min) 44 min    Activity Tolerance Patient tolerated treatment well    Behavior During Therapy WFL for tasks assessed/performed             Past Medical History:  Diagnosis Date   Anxiety    Asthma    exercise induced asthma    Essential hypertension    GERD (gastroesophageal reflux disease)    Heart murmur    mitral valve prolapse   History of dermatitis    History of migraine headaches yrs ago, none recent   Hyperlipemia 05/24/2016   Hypothyroidism    Meniere's disease    NSTEMI (non-ST elevated myocardial infarction) ([facility])    a. s/p NSTEMI in 12/2021 with cath showing normal cors and felt to possibly be secondary to spasm.   Palpitations    Rare PVCs by Holter monitor April 2011    Sleep apnea    Reportedly mild, no cpap needed   Tingling    both legs saw dr Epimenio Foot 04-23-2020 no cause found   Vitamin D insufficiency 05/24/2016   Past Surgical History:  Procedure Laterality Date   APPENDECTOMY  as child   BREAST LUMPECTOMY  yrs ago   Benign   DILATATION & CURETTAGE/HYSTEROSCOPY WITH MYOSURE N/A 05/07/2020   Procedure: DILATATION & CURETTAGE/HYSTEROSCOPY WITH MYOSURE/POLYP REMOVAL;  Surgeon: Romualdo Bolk, MD;  Location: [facility];  Service: Gynecology;  Laterality: N/A;  polyp removal   LEFT HEART CATH AND CORONARY ANGIOGRAPHY N/A 12/12/2021   Procedure: LEFT HEART CATH AND CORONARY ANGIOGRAPHY;  Surgeon: Tonny Bollman, MD;  Location:  [facility];  Service: Cardiovascular;  Laterality: N/A;   Patient Active Problem List   Diagnosis Date Noted   Non-ST elevation (NSTEMI) myocardial infarction ([facility]) 12/14/2021   Chest pain at rest 12/12/2021   ACS (acute coronary syndrome) ([facility])    Abnormal reflex 10/18/2019   Numbness 10/18/2019   B12 deficiency 10/18/2019   Osteoporosis 01/04/2017   Exercise-induced asthma 11/20/2016   Upper airway cough syndrome 07/01/2016   Dyspnea 07/01/2016   Vitamin D insufficiency 05/24/2016   Hyperlipidemia 05/24/2016   Palpitations 11/15/2014   Meniere's disease 07/21/2007   GASTROESOPHAGEAL REFLUX DISEASE, SEVERE 07/21/2007   Hypothyroidism 07/08/2007   Allergic rhinitis 07/08/2007    REFERRING DIAG: M75.51 (ICD-10-CM) - Subacromial bursitis of right shoulder joint   THERAPY DIAG:  Chronic pain in right shoulder  Stiffness of right shoulder, not elsewhere classified  Abnormal posture  Rationale for Evaluation and Treatment Rehabilitation  PERTINENT HISTORY: MI, osteoporosis  PRECAUTIONS: none  SUBJECTIVE: No pain at rest.  I think its better than it was.   PAIN:  Are you having pain? Yes: NPRS scale: 0/10 Pain location: Rt deltoid  Pain description: achy cramp Aggravating factors: lying on R side  Relieving factors: meds, resting    OBJECTIVE: (objective measures completed at initial evaluation unless  otherwise dated) DIAGNOSTIC FINDINGS:  Xray R shoulder: 04/26/22 IMPRESSION: Very mild glenohumeral and acromioclavicular osteoarthritis.   PATIENT SURVEYS:  FOTO 51% perceived LOF; predicted 65%   COGNITION:           Overall cognitive status: Within functional limits for tasks assessed                                  SENSATION: WFL   POSTURE: Forward head, rounded shoulders, increased kyphosis, R lateral shift of shoulders   UPPER EXTREMITY ROM:    Active ROM Right eval Left eval  Shoulder flexion 90 125  Shoulder extension      Shoulder  abduction      Shoulder adduction      Shoulder internal rotation      Shoulder external rotation      Elbow flexion      Elbow extension      Wrist flexion      Wrist extension      Wrist ulnar deviation      Wrist radial deviation      Wrist pronation      Wrist supination      (Blank rows = not tested)   UPPER EXTREMITY MMT:                       - R shoulder strength found WNLs c only mild provocation of pain MMT Right eval Left eval  Shoulder flexion 4+    Shoulder extension 4+    Shoulder abduction 4+    Shoulder adduction 4+    Shoulder internal rotation 4+    Shoulder external rotation 4+    Middle trapezius      Lower trapezius      Elbow flexion      Elbow extension      Wrist flexion      Wrist extension      Wrist ulnar deviation      Wrist radial deviation      Wrist pronation      Wrist supination      Grip strength (lbs)      (Blank rows = not tested)   SHOULDER SPECIAL TESTS:            Impingement tests: Hawkins/Kennedy impingement test: negative            Rotator cuff assessment: Empty can test: negative and Full can test: negative            Biceps assessment: Yergason's test: negative   PALPATION:  TTP lateral GH area             TODAY'S TREATMENT:  Cervical Retraction  5 reps - 3 hold             Scapular Retraction  5 reps - 3 hold Standing Shoulder Row with Anchored Resistance 10 reps - 2 hold GTB Shoulder extension with resistance 10 reps - 2 hold GTB Seated Upper Trapezius Stretch 3 reps - 30 hold   [facility] Adult PT Treatment:                                                DATE: 07/06/22 Therapeutic Exercise: UBE 5 min L1 cues for posture  Cervical Retraction  10 reps -  3 hold Scapular Retraction  15 reps - 3 hold Standing Shoulder Row with Anchored Resistance 10 reps - 2 hold red/green Shoulder extension with resistance 10 reps - 2 hold green  Seated Upper Trapezius Stretch 3 reps - 30 hold Supine arm with dowel: chest press ,  overhead lift and ER x 10-12 each to tolerance  Supine red TB ER 2 x 10  Supine horizonal abduction red band x 10  Flexion red  x 10  Standing wall for posture: red band x 10 flexion, isometric ER/chest press red looped band  Wall push up x 10  Wall flexion for AAROM and back extension   Manual Therapy: PROM all planes to tolerance , soft tissue to Rt upper trap, Rt deltoid, anterior shoulder A/P joint mobilization to Rt G-H joint   Modalities:  MHP declined  PATIENT EDUCATION: Education details: Eval findings, POC, , proper posture, pillow for R UE c L SL sleeping Person educated: Patient Education method: Explanation, Demonstration, Tactile cues, Verbal cues, and Handouts Education comprehension: verbalized understanding, returned demonstration, verbal cues required, and tactile cues required     HOME EXERCISE PROGRAM: Access Code: [facility] URL: [email or web address] Date: 07/02/2022 Prepared by: Joellyn Rued   Exercises - Seated Passive Cervical Retraction  - 6 x daily - 7 x weekly - 1 sets - 5 reps - 3 hold - Seated Scapular Retraction  - 6 x daily - 7 x weekly - 1 sets - 5 reps - 3 hold - Standing Shoulder Row with Anchored Resistance  - 2 x daily - 7 x weekly - 2 sets - 10 reps - 2 hold - Shoulder extension with resistance - Neutral  - 2 x daily - 7 x weekly - 2 sets - 10 reps - 2 hold - Seated Upper Trapezius Stretch  - 2 x daily - 7 x weekly - 1 sets - 3 reps - 30 hold   ASSESSMENT:   CLINICAL IMPRESSION: Patient with pain > 90 deg of flexion, abduction.  Encouraged spine extension and upper back activation with exercises. Pt did receive an injection prior to PT, is improving overall.       OBJECTIVE IMPAIRMENTS decreased ROM, decreased strength, impaired UE functional use, postural dysfunction, and pain.    ACTIVITY LIMITATIONS carrying, lifting, dressing, and caring for others   PARTICIPATION LIMITATIONS: meal prep, cleaning, laundry, driving,  shopping, and yard work   PERSONAL FACTORS Past/current experiences and Time since onset of injury/illness/exacerbation are also affecting patient's functional outcome.    REHAB POTENTIAL: Good   CLINICAL DECISION MAKING: Stable/uncomplicated   EVALUATION COMPLEXITY: Low     GOALS:   SHORT TERM GOALS: = LTGs     LONG TERM GOALS: Target 08/1522   Pt will demonstrate 120d of R shoulder flexion for improved R UE function c above shoulder height reaching Baseline: 90d Goal status: INITIAL   2.  Pt will report a decrease in R shoulder pain with daily activities to 0-3/10 for improved L UE function Baseline: ave. 5/10, 0-10/10 pain range Goal status: INITIAL   3.  Pt's FOTO perceived function score will increase to 65%  Baseline: 51% Goal status: INITIAL   4.  Pt will be ind in a final HEP to maintain achieved LOF Baseline: started on eval Goal status: INITIAL     PLAN: PT FREQUENCY: 2x/week   PT DURATION: 6 weeks   PLANNED INTERVENTIONS: Therapeutic exercises, Therapeutic activity, Patient/Family education, Self Care, Joint mobilization, Aquatic Therapy, Dry Needling, Electrical stimulation,  Cryotherapy, Moist heat, Taping, Vasopneumatic device, Ultrasound, Ionotophoresis 4mg /ml Dexamethasone, Manual therapy, and Re-evaluation   PLAN FOR NEXT SESSION: Review FOTO, assess response to HEP, use of modalities, manual therapy and TPDN as indicated      MS, PT 07/02/22 5:20 PM   Referring diagnosis? M75.51 (ICD-10-CM) - Subacromial bursitis of right shoulder joint   Treatment diagnosis? (if different than referring diagnosis) Chronic pain in right shoulder, Stiffness of right shoulder, not elsewhere classified, Abnormal posture   What was this (referring dx) caused by? []  Surgery []  Fall [x]  Ongoing issue []  Arthritis []  Other: ____________   Laterality: [x]  Rt []  Lt []  Both   Check all possible CPT codes:             *CHOOSE 10 OR LESS*                           [x]  97110 (Therapeutic Exercise)             []  92507 (SLP Treatment)  [x]  97112 (Neuro Re-ed)                           []  92526 (Swallowing Treatment)             [x]  97116 (Gait Training)                           []  07/04/22 (Cognitive Training, 1st 15 minutes) [x]  97140 (Manual Therapy)                                []  97130 (Cognitive Training, each add'l 15 minutes)   [x]  97164 (Re-evaluation)                              []  Other, List CPT Code ____________  [x]  97530 (Therapeutic Activities)                                    [x]  97535 (Self Care)                      []  All codes above (97110 - 97535)            []  97012 (Mechanical Traction)            [x]  97014 (E-stim Unattended)            [x]  97032 (E-stim manual)            [x]  97033 (Ionto)            [x]  97035 (Ultrasound) []  97750 (Physical Performance Training) [x]  (Aquatic Therapy) [x]  97016 (Vasopneumatic Device) []  (Paraffin) []  (Contrast Bath) []  97597 (Wound Care 1st 20 sq cm) []  97598 (Wound Care each add'l 20 sq cm) []  97760 (Orthotic Fabrication, Fitting, Training Initial) []  (Prosthetic Management and Training Initial) []  (Orthotic or Prosthetic Training/ Modification Subsequent)      Jaramiah Bossard, PT 07/06/2022, 2:12 PM    , PT 07/06/22 2:12 PM Phone: [phone] Fax: [phone]

## 2022-07-08 ENCOUNTER — Ambulatory Visit: Payer: Medicare PPO | Attending: Cardiology | Admitting: Physical Therapy

## 2022-07-08 ENCOUNTER — Encounter: Payer: Self-pay | Admitting: Physical Therapy

## 2022-07-08 DIAGNOSIS — G8929 Other chronic pain: Secondary | ICD-10-CM | POA: Diagnosis not present

## 2022-07-08 DIAGNOSIS — M25611 Stiffness of right shoulder, not elsewhere classified: Secondary | ICD-10-CM | POA: Diagnosis not present

## 2022-07-08 DIAGNOSIS — R293 Abnormal posture: Secondary | ICD-10-CM | POA: Diagnosis not present

## 2022-07-08 DIAGNOSIS — M25511 Pain in right shoulder: Secondary | ICD-10-CM | POA: Diagnosis not present

## 2022-07-08 NOTE — Therapy (Signed)
OUTPATIENT PHYSICAL THERAPY TREATMENT NOTE   Patient Name: Tracey Giles MRN: [id number] DOB:March 31, 1951, 71 y.o., female Today's Date: 07/08/2022  PCP: Dr. Micheline Rough   REFERRING PROVIDER: Glennon Mac, DO   END OF SESSION:   PT End of Session - 07/08/22 1509     Visit Number 3    Number of Visits 13    Date for PT Re-Evaluation 08/20/22    Authorization Type [employer] MEDICARE CHOICE PPO    Progress Note Due on Visit 10    PT Start Time 1506    PT Stop Time 1550    PT Time Calculation (min) 44 min    Activity Tolerance Patient tolerated treatment well    Behavior During Therapy WFL for tasks assessed/performed              Past Medical History:  Diagnosis Date   Anxiety    Asthma    exercise induced asthma    Essential hypertension    GERD (gastroesophageal reflux disease)    Heart murmur    mitral valve prolapse   History of dermatitis    History of migraine headaches yrs ago, none recent   Hyperlipemia 05/24/2016   Hypothyroidism    Meniere's disease    NSTEMI (non-ST elevated myocardial infarction) ([place])    a. s/p NSTEMI in 12/2021 with cath showing normal cors and felt to possibly be secondary to spasm.   Palpitations    Rare PVCs by Holter monitor April 2011    Sleep apnea    Reportedly mild, no cpap needed   Tingling    both legs saw dr Felecia Shelling 04-23-2020 no cause found   Vitamin D insufficiency 05/24/2016   Past Surgical History:  Procedure Laterality Date   APPENDECTOMY  as child   BREAST LUMPECTOMY  yrs ago   [place] N/A 05/07/2020   Procedure: DILATATION & CURETTAGE/HYSTEROSCOPY WITH MYOSURE/POLYP REMOVAL;  Surgeon: Salvadore Dom, MD;  Location: [place];  Service: Gynecology;  Laterality: N/A;  polyp removal   LEFT HEART CATH AND CORONARY ANGIOGRAPHY N/A 12/12/2021   Procedure: LEFT HEART CATH AND CORONARY ANGIOGRAPHY;  Surgeon: Sherren Mocha, MD;  Location:  [facility];  Service: Cardiovascular;  Laterality: N/A;   Patient Active Problem List   Diagnosis Date Noted   Non-ST elevation (NSTEMI) myocardial infarction ([place]) 12/14/2021   Chest pain at rest 12/12/2021   ACS (acute coronary syndrome) ([place])    Abnormal reflex 10/18/2019   Numbness 10/18/2019   B12 deficiency 10/18/2019   Osteoporosis 01/04/2017   Exercise-induced asthma 11/20/2016   Upper airway cough syndrome 07/01/2016   Dyspnea 07/01/2016   Vitamin D insufficiency 05/24/2016   Hyperlipidemia 05/24/2016   Palpitations 11/15/2014   Meniere's disease 07/21/2007   GASTROESOPHAGEAL REFLUX DISEASE, SEVERE 07/21/2007   Hypothyroidism 07/08/2007   Allergic rhinitis 07/08/2007    REFERRING DIAG: M75.51 (ICD-10-CM) - Subacromial bursitis of right shoulder joint   THERAPY DIAG:  Chronic pain in right shoulder  Stiffness of right shoulder, not elsewhere classified  Abnormal posture  Rationale for Evaluation and Treatment Rehabilitation  PERTINENT HISTORY: MI, osteoporosis  PRECAUTIONS: none  SUBJECTIVE: No pain at rest. It only hurts if I reach out and back.   PAIN:  Are you having pain? Yes: NPRS scale: 0/10 Pain location: Rt deltoid  Pain description: achy cramp Aggravating factors: lying on R side  Relieving factors: meds, resting    OBJECTIVE: (objective measures completed at initial  evaluation unless otherwise dated) DIAGNOSTIC FINDINGS:  Xray R shoulder: 04/26/22 IMPRESSION: Very mild glenohumeral and acromioclavicular osteoarthritis.   PATIENT SURVEYS:  FOTO 51% perceived LOF; predicted 65%   COGNITION:           Overall cognitive status: Within functional limits for tasks assessed                                  SENSATION: WFL   POSTURE: Forward head, rounded shoulders, increased kyphosis, R lateral shift of shoulders   UPPER EXTREMITY ROM:    Active ROM Right eval Left eval  Shoulder flexion 90 125  Shoulder extension      Shoulder  abduction      Shoulder adduction      Shoulder internal rotation      Shoulder external rotation      Elbow flexion      Elbow extension      Wrist flexion      Wrist extension      Wrist ulnar deviation      Wrist radial deviation      Wrist pronation      Wrist supination      (Blank rows = not tested)   UPPER EXTREMITY MMT:                       - R shoulder strength found WNLs c only mild provocation of pain MMT Right eval Left eval  Shoulder flexion 4+    Shoulder extension 4+    Shoulder abduction 4+    Shoulder adduction 4+    Shoulder internal rotation 4+    Shoulder external rotation 4+    Middle trapezius      Lower trapezius      Elbow flexion      Elbow extension      Wrist flexion      Wrist extension      Wrist ulnar deviation      Wrist radial deviation      Wrist pronation      Wrist supination      Grip strength (lbs)      (Blank rows = not tested)   SHOULDER SPECIAL TESTS:            Impingement tests: Hawkins/Kennedy impingement test: negative            Rotator cuff assessment: Empty can test: negative and Full can test: negative            Biceps assessment: Yergason's test: negative   PALPATION:  TTP lateral GH area             TODAY'S TREATMENT:  Cervical Retraction  5 reps - 3 hold             Scapular Retraction  5 reps - 3 hold Standing Shoulder Row with Anchored Resistance 10 reps - 2 hold GTB Shoulder extension with resistance 10 reps - 2 hold GTB Seated Upper Trapezius Stretch 3 reps - 30 hold    [facility] Adult PT Treatment:                                                DATE: 07/08/22 Therapeutic Exercise: UBE 5 min L1 cues for posture  Pulleys overhead  3 min Standing Rt UE flexion and scaption  x 10 using wall  Ball rolls (red 1000 g) 1 min each direction shoulder height  Flexion, abduction, extension and internal rotation x 10 with dowel standing  Supine ER/IR unattached x 20 (red)   PT assisted ER and IR red band  x 15 at 90  deg Supine 3 lbs x 15 flexion chest press Sidelying ER, 3 lbs x 10 x 2 sets  Sidelying abduction 1 lbs x 10  Manual Therapy: Brief PROM     [facility] Adult PT Treatment:                                                DATE: 07/06/22 Therapeutic Exercise: UBE 5 min L1 cues for posture  Cervical Retraction  10 reps - 3 hold Scapular Retraction  15 reps - 3 hold Standing Shoulder Row with Anchored Resistance 10 reps - 2 hold red/green Shoulder extension with resistance 10 reps - 2 hold green  Seated Upper Trapezius Stretch 3 reps - 30 hold Supine arm with dowel: chest press , overhead lift and ER x 10-12 each to tolerance  Supine red TB ER 2 x 10  Supine horizonal abduction red band x 10  Flexion red  x 10  Standing wall for posture: red band x 10 flexion, isometric ER/chest press red looped band  Wall push up x 10  Wall flexion for AAROM and back extension   Manual Therapy: PROM all planes to tolerance , soft tissue to Rt upper trap, Rt deltoid, anterior shoulder A/P joint mobilization to Rt G-H joint   Modalities:  MHP declined  PATIENT EDUCATION: Education details: Eval findings, POC, , proper posture, pillow for R UE c L SL sleeping Person educated: Patient Education method: Explanation, Demonstration, Tactile cues, Verbal cues, and Handouts Education comprehension: verbalized understanding, returned demonstration, verbal cues required, and tactile cues required     HOME EXERCISE PROGRAM: Access Code: [facility] URL: [email or web address] Date: 07/02/2022 Prepared by: Gar Ponto   Exercises - Seated Passive Cervical Retraction  - 6 x daily - 7 x weekly - 1 sets - 5 reps - 3 hold - Seated Scapular Retraction  - 6 x daily - 7 x weekly - 1 sets - 5 reps - 3 hold - Standing Shoulder Row with Anchored Resistance  - 2 x daily - 7 x weekly - 2 sets - 10 reps - 2 hold - Shoulder extension with resistance - Neutral  - 2 x daily - 7 x weekly - 2 sets - 10 reps - 2 hold -  Seated Upper Trapezius Stretch  - 2 x daily - 7 x weekly - 1 sets - 3 reps - 30 hold   ASSESSMENT:   CLINICAL IMPRESSION: Patient shows improved tolerance for flexion, overhead reaching and is able to perform strengthening exercises with min discomfort in shoulder.  Her pain is intermittent and continues to be an issue with ADLs, home tasks.    OBJECTIVE IMPAIRMENTS decreased ROM, decreased strength, impaired UE functional use, postural dysfunction, and pain.    ACTIVITY LIMITATIONS carrying, lifting, dressing, and caring for others   PARTICIPATION LIMITATIONS: meal prep, cleaning, laundry, driving, shopping, and yard work   PERSONAL FACTORS Past/current experiences and Time since onset of injury/illness/exacerbation are also affecting patient's functional outcome.    REHAB POTENTIAL: Good  CLINICAL DECISION MAKING: Stable/uncomplicated   EVALUATION COMPLEXITY: Low     GOALS:   SHORT TERM GOALS: = LTGs     LONG TERM GOALS: Target 08/1522   Pt will demonstrate 120d of R shoulder flexion for improved R UE function c above shoulder height reaching Baseline: 90d Goal status: INITIAL   2.  Pt will report a decrease in R shoulder pain with daily activities to 0-3/10 for improved L UE function Baseline: ave. 5/10, 0-10/10 pain range Goal status: INITIAL   3.  Pt's FOTO perceived function score will increase to 65%  Baseline: 51% Goal status: INITIAL   4.  Pt will be ind in a final HEP to maintain achieved LOF Baseline: started on eval Goal status: INITIAL     PLAN: PT FREQUENCY: 2x/week   PT DURATION: 6 weeks   PLANNED INTERVENTIONS: Therapeutic exercises, Therapeutic activity, Patient/Family education, Self Care, Joint mobilization, Aquatic Therapy, Dry Needling, Electrical stimulation, Cryotherapy, Moist heat, Taping, Vasopneumatic device, Ultrasound, Ionotophoresis 4mg /ml Dexamethasone, Manual therapy, and Re-evaluation   PLAN FOR NEXT SESSION: Review FOTO, assess  response to HEP, use of modalities, manual therapy and TPDN as indicated         Daylon Lafavor, PT 07/08/2022, 3:49 PM    09/07/2022, PT 07/08/22 3:49 PM Phone: [phone] Fax: [phone]

## 2022-07-10 DIAGNOSIS — H52203 Unspecified astigmatism, bilateral: Secondary | ICD-10-CM | POA: Diagnosis not present

## 2022-07-10 DIAGNOSIS — H2513 Age-related nuclear cataract, bilateral: Secondary | ICD-10-CM | POA: Diagnosis not present

## 2022-07-12 NOTE — Therapy (Signed)
OUTPATIENT PHYSICAL THERAPY TREATMENT NOTE   Patient Name: Tracey Giles MRN: [id number] DOB:17-Apr-1951, 71 y.o., female Today's Date: 07/13/2022  PCP: Dr. [name]   REFERRING PROVIDER: Richardean Sale, DO   END OF SESSION:   PT End of Session - 07/13/22 1323     Visit Number 4    Number of Visits 13    Date for PT Re-Evaluation 08/20/22    Authorization Type [employer] MEDIC[employer] CHOICE PPO    Progress Note Due on Visit 10    PT Start Time 1330    PT Stop Time 1414    PT Time Calculation (min) 44 min    Activity Tolerance Patient tolerated treatment well    Behavior During Therapy WFL for tasks assessed/performed               Past Medical History:  Diagnosis Date   Anxiety    Asthma    exercise induced asthma    Essential hypertension    GERD (gastroesophageal reflux disease)    Heart murmur    mitral valve prolapse   History of dermatitis    History of migraine headaches yrs ago, none recent   Hyperlipemia 05/24/2016   Hypothyroidism    Meniere's disease    NSTEMI (non-ST elevated myocardial infarction) ([facility])    a. s/p NSTEMI in 12/2021 with cath showing normal cors and felt to possibly be secondary to spasm.   Palpitations    Rare PVCs by Holter monitor April 2011    Sleep apnea    Reportedly mild, no cpap needed   Tingling    both legs saw dr Epimenio Foot 04-23-2020 no cause found   Vitamin D insufficiency 05/24/2016   Past Surgical History:  Procedure Laterality Date   APPENDECTOMY  as child   BREAST LUMPECTOMY  yrs ago   Benign   DILATATION & CURETTAGE/HYSTEROSCOPY WITH MYOSURE N/A 05/07/2020   Procedure: DILATATION & CURETTAGE/HYSTEROSCOPY WITH MYOSURE/POLYP REMOVAL;  Surgeon: Romualdo Bolk, MD;  Location: [facility];  Service: Gynecology;  Laterality: N/A;  polyp removal   LEFT HEART CATH AND CORONARY ANGIOGRAPHY N/A 12/12/2021   Procedure: LEFT HEART CATH AND CORONARY ANGIOGRAPHY;  Surgeon: Tonny Bollman, MD;   Location: [facility];  Service: Cardiovascular;  Laterality: N/A;   Patient Active Problem List   Diagnosis Date Noted   Non-ST elevation (NSTEMI) myocardial infarction ([facility]) 12/14/2021   Chest pain at rest 12/12/2021   ACS (acute coronary syndrome) ([facility])    Abnormal reflex 10/18/2019   Numbness 10/18/2019   B12 deficiency 10/18/2019   Osteoporosis 01/04/2017   Exercise-induced asthma 11/20/2016   Upper airway cough syndrome 07/01/2016   Dyspnea 07/01/2016   Vitamin D insufficiency 05/24/2016   Hyperlipidemia 05/24/2016   Palpitations 11/15/2014   Meniere's disease 07/21/2007   GASTROESOPHAGEAL REFLUX DISEASE, SEVERE 07/21/2007   Hypothyroidism 07/08/2007   Allergic rhinitis 07/08/2007    REFERRING DIAG: M75.51 (ICD-10-CM) - Subacromial bursitis of right shoulder joint   THERAPY DIAG:  Chronic pain in right shoulder  Stiffness of right shoulder, not elsewhere classified  Abnormal posture  Rationale for Evaluation and Treatment Rehabilitation  PERTINENT HISTORY: MI, osteoporosis  PRECAUTIONS: none  SUBJECTIVE: It is getting better. It used to hurt to reach down but that's better. Some pain when reaching back.  None present.   PAIN:  Are you having pain? Yes: NPRS scale: 0/10 Pain location: Rt deltoid  Pain description: achy cramp Aggravating factors: lying on R side  Relieving factors: meds,  resting    OBJECTIVE: (objective measures completed at initial evaluation unless otherwise dated) DIAGNOSTIC FINDINGS:  Xray R shoulder: 04/26/22 IMPRESSION: Very mild glenohumeral and acromioclavicular osteoarthritis.   PATIENT SURVEYS:  FOTO 51% perceived LOF; predicted 65%   COGNITION:           Overall cognitive status: Within functional limits for tasks assessed                                  SENSATION: WFL   POSTURE: Forward head, rounded shoulders, increased kyphosis, R lateral shift of shoulders   UPPER EXTREMITY ROM:    Active ROM Right eval  Left eval  Shoulder flexion 90 125  Shoulder extension      Shoulder abduction      Shoulder adduction      Shoulder internal rotation      Shoulder external rotation      Elbow flexion      Elbow extension      Wrist flexion      Wrist extension      Wrist ulnar deviation      Wrist radial deviation      Wrist pronation      Wrist supination      (Blank rows = not tested)   UPPER EXTREMITY MMT:                       - R shoulder strength found WNLs c only mild provocation of pain MMT Right eval Left eval  Shoulder flexion 4+    Shoulder extension 4+    Shoulder abduction 4+    Shoulder adduction 4+    Shoulder internal rotation 4+    Shoulder external rotation 4+    Middle trapezius      Lower trapezius      Elbow flexion      Elbow extension      Wrist flexion      Wrist extension      Wrist ulnar deviation      Wrist radial deviation      Wrist pronation      Wrist supination      Grip strength (lbs)      (Blank rows = not tested)   SHOULDER SPECIAL TESTS:            Impingement tests: Hawkins/Kennedy impingement test: negative            Rotator cuff assessment: Empty can test: negative and Full can test: negative            Biceps assessment: Yergason's test: negative   PALPATION:  TTP lateral GH area             TODAY'S TREATMENT:  Cervical Retraction  5 reps - 3 hold             Scapular Retraction  5 reps - 3 hold Standing Shoulder Row with Anchored Resistance 10 reps - 2 hold GTB Shoulder extension with resistance 10 reps - 2 hold GTB Seated Upper Trapezius Stretch 3 reps - 30 hold  [facility] Adult PT Treatment:                                                DATE: 07/13/22 Therapeutic Exercise: Lora Paula LE  and UE Level 5 , 5 min  Standing row blue band x 15 added alternating with longer hold time  Standing extension green band x 15  Standing ER red band  Diagonal pull red band x 10  Supine chest press 3 lbs x 15 Flexion Rt UE elbow extended 3 lbs x 10   Sidelying ER 3 lbs x 12, then 1 lbs abduction x 12  Seated overhead lift with pilates circle 2 x 8 reps  Wall for posture: goal post x 10, horizontal pull red band x 10 Weight shifts for shoulder flexion and T-extension Seated neck stretches 2 x 30 sec Cervical rotation and flex/ext smaller ROM   [facility] Adult PT Treatment:                                                DATE: 07/08/22 Therapeutic Exercise: UBE 5 min L1 cues for posture  Pulleys overhead 3 min Standing Rt UE flexion and scaption  x 10 using wall  Ball rolls (red 1000 g) 1 min each direction shoulder height  Flexion, abduction, extension and internal rotation x 10 with dowel standing  Supine ER/IR unattached x 20 (red)   PT assisted ER and IR red band  x 15 at 90 deg Supine 3 lbs x 15 flexion chest press Sidelying ER, 3 lbs x 10 x 2 sets  Sidelying abduction 1 lbs x 10  Manual Therapy: Brief PROM     [facility] Adult PT Treatment:                                                DATE: 07/06/22 Therapeutic Exercise: UBE 5 min L1 cues for posture  Cervical Retraction  10 reps - 3 hold Scapular Retraction  15 reps - 3 hold Standing Shoulder Row with Anchored Resistance 10 reps - 2 hold red/green Shoulder extension with resistance 10 reps - 2 hold green  Seated Upper Trapezius Stretch 3 reps - 30 hold Supine arm with dowel: chest press , overhead lift and ER x 10-12 each to tolerance  Supine red TB ER 2 x 10  Supine horizonal abduction red band x 10  Flexion red  x 10  Standing wall for posture: red band x 10 flexion, isometric ER/chest press red looped band  Wall push up x 10  Wall flexion for AAROM and back extension   Manual Therapy: PROM all planes to tolerance , soft tissue to Rt upper trap, Rt deltoid, anterior shoulder A/P joint mobilization to Rt G-H joint   Modalities:  MHP declined  PATIENT EDUCATION: Education details: Eval findings, POC, , proper posture, pillow for R UE c L SL sleeping Person educated:  Patient Education method: Explanation, Demonstration, Tactile cues, Verbal cues, and Handouts Education comprehension: verbalized understanding, returned demonstration, verbal cues required, and tactile cues required     HOME EXERCISE PROGRAM: Access Code: [facility] URL: [email or web address] Date: 07/02/2022 Prepared by: Joellyn Rued   Exercises - Seated Passive Cervical Retraction  - 6 x daily - 7 x weekly - 1 sets - 5 reps - 3 hold - Seated Scapular Retraction  - 6 x daily - 7 x weekly - 1 sets - 5 reps - 3 hold - Standing  Shoulder Row with Anchored Resistance  - 2 x daily - 7 x weekly - 2 sets - 10 reps - 2 hold - Shoulder extension with resistance - Neutral  - 2 x daily - 7 x weekly - 2 sets - 10 reps - 2 hold - Seated Upper Trapezius Stretch  - 2 x daily - 7 x weekly - 1 sets - 3 reps - 30 hold -ER unattached red band    ASSESSMENT:   CLINICAL IMPRESSION: Patient continues to improve her Rt UE ROM and function.  AROM of Rt UE is limited by stiffness of cervical and thoracic spine.  Min to no pain in Rt UE with exercises including overhead flexion and abduction. Sees Dr. Jean Rosenthal next week, may decide to finish POC before 12 visits.    OBJECTIVE IMPAIRMENTS decreased ROM, decreased strength, impaired UE functional use, postural dysfunction, and pain.    ACTIVITY LIMITATIONS carrying, lifting, dressing, and caring for others   PARTICIPATION LIMITATIONS: meal prep, cleaning, laundry, driving, shopping, and yard work   PERSONAL FACTORS Past/current experiences and Time since onset of injury/illness/exacerbation are also affecting patient's functional outcome.    REHAB POTENTIAL: Good   CLINICAL DECISION MAKING: Stable/uncomplicated   EVALUATION COMPLEXITY: Low     GOALS:   SHORT TERM GOALS: = LTGs     LONG TERM GOALS: Target 08/1522   Pt will demonstrate 120d of R shoulder flexion for improved R UE function c above shoulder height reaching Baseline:  90d Goal status: INITIAL   2.  Pt will report a decrease in R shoulder pain with daily activities to 0-3/10 for improved L UE function Baseline: ave. 5/10, 0-10/10 pain range Goal status: INITIAL   3.  Pt's FOTO perceived function score will increase to 65%  Baseline: 51% Goal status: INITIAL   4.  Pt will be ind in a final HEP to maintain achieved LOF Baseline: started on eval Goal status: INITIAL     PLAN: PT FREQUENCY: 2x/week   PT DURATION: 6 weeks   PLANNED INTERVENTIONS: Therapeutic exercises, Therapeutic activity, Patient/Family education, Self Care, Joint mobilization, Aquatic Therapy, Dry Needling, Electrical stimulation, Cryotherapy, Moist heat, Taping, Vasopneumatic device, Ultrasound, Ionotophoresis 4mg /ml Dexamethasone, Manual therapy, and Re-evaluation   PLAN FOR NEXT SESSION: check HEP, posture and strength as tol.         Crespin Forstrom, PT 07/13/2022, 2:19 PM    09/12/2022, PT 07/13/22 2:19 PM Phone: [phone] Fax: [phone]

## 2022-07-13 ENCOUNTER — Ambulatory Visit: Payer: Medicare PPO | Admitting: Physical Therapy

## 2022-07-13 DIAGNOSIS — G8929 Other chronic pain: Secondary | ICD-10-CM

## 2022-07-13 DIAGNOSIS — M25611 Stiffness of right shoulder, not elsewhere classified: Secondary | ICD-10-CM | POA: Diagnosis not present

## 2022-07-13 DIAGNOSIS — M25511 Pain in right shoulder: Secondary | ICD-10-CM | POA: Diagnosis not present

## 2022-07-13 DIAGNOSIS — R293 Abnormal posture: Secondary | ICD-10-CM | POA: Diagnosis not present

## 2022-07-13 NOTE — Progress Notes (Signed)
Tracey Giles [facility] [address] [place] [address] Phone: [phone]   Assessment and Plan:     1. Pain in joint of right shoulder 2. Subacromial bursitis of right shoulder joint -Chronic with exacerbation, subsequent visit - Overall improvement after subacromial CSI, HEP and physical therapy since initial visit on 06/16/2022, however patient continued to have limited range of motion and pain especially with abduction of shoulder - Start meloxicam 15 mg daily x2 weeks.  If still having pain after 2 weeks, complete 3rd-week of meloxicam. May use remaining meloxicam as needed once daily for pain control.  Do not to use additional NSAIDs while taking meloxicam.  May use Tylenol [phone] mg 2 to 3 times a day for breakthrough pain. - Continue HEP and physical therapy  Other orders - meloxicam (MOBIC) 15 MG tablet; Take 1 tablet (15 mg total) by mouth daily.    Pertinent previous records reviewed include none   Follow Up: 3 weeks for reevaluation.  If patient still has limitation in range of motion and has discomfort, would consider large-volume subacromial injection versus intra-articular injection   Subjective:   I, Tracey Giles, am serving as a Neurosurgeon for Doctor Richardean Sale   Chief Complaint: right shoulder pain    HPI:    06/16/2022 Patient is a 71 year old female complaining of right shoulder pain. Patient statesb that for about 6 months she has had pain on her bicep to the point where she cant lift the arm and its had for her to wash her hair, no MOI , no numbness or tingling , has not been taking meds for the pain , decreased ROM  07/21/2022 Patient states thinks she might be a little bette, not where she wants to be thinks PT is helping a little     Relevant Historical Information: Hypothyroidism, Mnire's   Additional pertinent review of systems negative.   Current Outpatient Medications:    acetaminophen  (TYLENOL) 500 MG tablet, Take 1,000 mg by mouth every 6 (six) hours as needed (pain)., Disp: , Rfl:    albuterol (VENTOLIN HFA) 108 (90 Base) MCG/ACT inhaler, Inhale 2 puffs into the lungs every 6 (six) hours as needed. (Patient taking differently: Inhale 2 puffs into the lungs every 6 (six) hours as needed for wheezing or shortness of breath.), Disp: 8.5 g, Rfl: 1   amLODipine (NORVASC) 2.5 MG tablet, Take 1 tablet (2.5 mg total) by mouth daily., Disp: 90 tablet, Rfl: 3   aspirin EC 81 MG EC tablet, Take 1 tablet (81 mg total) by mouth daily. Swallow whole., Disp: 90 tablet, Rfl: 2   atorvastatin (LIPITOR) 10 MG tablet, Take 1 tablet (10 mg total) by mouth daily., Disp: 90 tablet, Rfl: 3   cholecalciferol (VITAMIN D) 25 MCG (1000 UNIT) tablet, Take 1,000 Units by mouth every morning., Disp: , Rfl:    Cyanocobalamin (B-12) 1000 MCG TABS, Take 1,000 mcg by mouth every morning., Disp: , Rfl:    FOLIC ACID PO, Take 1 tablet by mouth every morning., Disp: , Rfl:    levocetirizine (XYZAL) 5 MG tablet, Take 1 tablet (5 mg total) by mouth every evening., Disp: 30 tablet, Rfl: 5   LORazepam (ATIVAN) 0.5 MG tablet, Take 0.5-1 tablets (0.25-0.5 mg total) by mouth every 8 (eight) hours as needed for anxiety., Disp: 10 tablet, Rfl: 0   meloxicam (MOBIC) 15 MG tablet, Take 1 tablet (15 mg total) by mouth daily., Disp: 30 tablet, Rfl: 0  metoprolol succinate (TOPROL-XL) 100 MG 24 hr tablet, Take 1 tablet (100 mg total) by mouth daily after supper., Disp: 90 tablet, Rfl: 1   omeprazole (PRILOSEC OTC) 20 MG tablet, Take 20 mg by mouth daily as needed (acid reflux/heartburn)., Disp: , Rfl:    SYNTHROID 75 MCG tablet, Take 1 tablet (75 mcg total) by mouth daily before breakfast., Disp: 90 tablet, Rfl: 1   Objective:     Vitals:   07/21/22 1347  BP: 124/82  Pulse: 65  SpO2: 99%  Weight: 139 lb (63 kg)  Height: 5\' 7"  (1.702 m)      Body mass index is 21.77 kg/m.    Physical Exam:    Gen: Appears well,  nad, nontoxic and pleasant Neuro:sensation intact, strength is 5/5 with df/pf/inv/ev, muscle tone wnl Skin: no suspicious lesion or defmority Psych: A&O, appropriate mood and affect  Right shoulder: no deformity, swelling or muscle wasting No scapular winging FF 110, abd 100, int 10, ext 80 NTTP over the Ewing, clavicle, ac, coracoid, biceps groove, humerus, deltoid, trapezius, cervical spine Equivocal special testing due to patient's limited range of motion Neg ant drawer, sulcus sign, apprehension Negative Spurling's test bilat FROM of neck    Electronically signed by:  D[facility] 1:59 PM 07/21/22

## 2022-07-14 ENCOUNTER — Ambulatory Visit: Payer: Medicare PPO | Admitting: Sports Medicine

## 2022-07-14 NOTE — Therapy (Signed)
OUTPATIENT PHYSICAL THERAPY TREATMENT NOTE   Patient Name: Tracey Giles MRN: [id number] DOB:01-21-1951, 71 y.o., female Today's Date: 07/15/2022  PCP: Dr. [name]   REFERRING PROVIDER: Richardean Sale, DO   END OF SESSION:   PT End of Session - 07/15/22 1429     Visit Number 5    Number of Visits 13    Date for PT Re-Evaluation 08/20/22    Authorization Type [employer] MEDIC[employer] CHOICE PPO    Progress Note Due on Visit 10    PT Start Time 1420    PT Stop Time 1500    PT Time Calculation (min) 40 min    Activity Tolerance Patient tolerated treatment well    Behavior During Therapy WFL for tasks assessed/performed                Past Medical History:  Diagnosis Date   Anxiety    Asthma    exercise induced asthma    Essential hypertension    GERD (gastroesophageal reflux disease)    Heart murmur    mitral valve prolapse   History of dermatitis    History of migraine headaches yrs ago, none recent   Hyperlipemia 05/24/2016   Hypothyroidism    Meniere's disease    NSTEMI (non-ST elevated myocardial infarction) (HCC)    a. s/p NSTEMI in 12/2021 with cath showing normal cors and felt to possibly be secondary to spasm.   Palpitations    Rare PVCs by Holter monitor April 2011    Sleep apnea    Reportedly mild, no cpap needed   Tingling    both legs saw dr Epimenio Foot 04-23-2020 no cause found   Vitamin D insufficiency 05/24/2016   Past Surgical History:  Procedure Laterality Date   APPENDECTOMY  as child   BREAST LUMPECTOMY  yrs ago   Benign   DILATATION & CURETTAGE/HYSTEROSCOPY WITH MYOSURE N/A 05/07/2020   Procedure: DILATATION & CURETTAGE/HYSTEROSCOPY WITH MYOSURE/POLYP REMOVAL;  Surgeon: Romualdo Bolk, MD;  Location: [facility] [place];  Service: Gynecology;  Laterality: N/A;  polyp removal   LEFT HEART CATH AND CORONARY ANGIOGRAPHY N/A 12/12/2021   Procedure: LEFT HEART CATH AND CORONARY ANGIOGRAPHY;  Surgeon: Tonny Bollman, MD;   Location: [facility] INVASIVE CV LAB;  Service: Cardiovascular;  Laterality: N/A;   Patient Active Problem List   Diagnosis Date Noted   Non-ST elevation (NSTEMI) myocardial infarction (HCC) 12/14/2021   Chest pain at rest 12/12/2021   ACS (acute coronary syndrome) (HCC)    Abnormal reflex 10/18/2019   Numbness 10/18/2019   B12 deficiency 10/18/2019   Osteoporosis 01/04/2017   Exercise-induced asthma 11/20/2016   Upper airway cough syndrome 07/01/2016   Dyspnea 07/01/2016   Vitamin D insufficiency 05/24/2016   Hyperlipidemia 05/24/2016   Palpitations 11/15/2014   Meniere's disease 07/21/2007   GASTROESOPHAGEAL REFLUX DISEASE, SEVERE 07/21/2007   Hypothyroidism 07/08/2007   Allergic rhinitis 07/08/2007    REFERRING DIAG: M75.51 (ICD-10-CM) - Subacromial bursitis of right shoulder joint   THERAPY DIAG:  Chronic pain in right shoulder  Stiffness of right shoulder, not elsewhere classified  Abnormal posture  Rationale for Evaluation and Treatment Rehabilitation  PERTINENT HISTORY: MI, osteoporosis  PRECAUTIONS: none  SUBJECTIVE:  My R shoulder is about the same as it was during the last visit. The intensity of the intermittent R shoulder pain is less.  PAIN:  Are you having pain? Yes: NPRS scale: 0/10 Pain location: Rt deltoid  Pain description: achy cramp Aggravating factors: lying on R side  Relieving factors: meds, resting    OBJECTIVE: (objective measures completed at initial evaluation unless otherwise dated) DIAGNOSTIC FINDINGS:  Xray R shoulder: 04/26/22 IMPRESSION: Very mild glenohumeral and acromioclavicular osteoarthritis.   PATIENT SURVEYS:  FOTO 51% perceived LOF; predicted 65%   COGNITION:           Overall cognitive status: Within functional limits for tasks assessed                                  SENSATION: WFL   POSTURE: Forward head, rounded shoulders, increased kyphosis, R lateral shift of shoulders   UPPER EXTREMITY ROM:    Active ROM  Right eval Left eval  Shoulder flexion 90 125  Shoulder extension      Shoulder abduction      Shoulder adduction      Shoulder internal rotation      Shoulder external rotation      Elbow flexion      Elbow extension      Wrist flexion      Wrist extension      Wrist ulnar deviation      Wrist radial deviation      Wrist pronation      Wrist supination      (Blank rows = not tested)   UPPER EXTREMITY MMT:                       - R shoulder strength found WNLs c only mild provocation of pain MMT Right eval Left eval  Shoulder flexion 4+    Shoulder extension 4+    Shoulder abduction 4+    Shoulder adduction 4+    Shoulder internal rotation 4+    Shoulder external rotation 4+    Middle trapezius      Lower trapezius      Elbow flexion      Elbow extension      Wrist flexion      Wrist extension      Wrist ulnar deviation      Wrist radial deviation      Wrist pronation      Wrist supination      Grip strength (lbs)      (Blank rows = not tested)   SHOULDER SPECIAL TESTS:            Impingement tests: Hawkins/Kennedy impingement test: negative            Rotator cuff assessment: Empty can test: negative and Full can test: negative            Biceps assessment: Yergason's test: negative   PALPATION:  TTP lateral GH area             TODAY'S TREATMENT:  [facility] Adult PT Treatment:                                                DATE: 07/15/22 Therapeutic Exercise: Nustep LE and UE Level 5 , 6 min  90d doorway pectoral stretch x2 30" Shoulder ER doorway stretch Standing open book x10 each Seated trunk ext x10 Standing row blue band 2 x 15 added alternating with longer hold time  Standing extension green band 2 x 15  Standing ER red band 2x 15 Updated HEP  [facility] Adult PT Treatment:                                                DATE: 07/13/22 Therapeutic Exercise: Nustep LE and UE Level 5 , 5 min  Standing row blue band x 15 added alternating with longer hold time   Standing extension green band x 15  Standing ER red band  Diagonal pull red band x 10  Supine chest press 3 lbs x 15 Flexion Rt UE elbow extended 3 lbs x 10  Sidelying ER 3 lbs x 12, then 1 lbs abduction x 12  Seated overhead lift with pilates circle 2 x 8 reps  Wall for posture: goal post x 10, horizontal pull red band x 10 Weight shifts for shoulder flexion and T-extension Seated neck stretches 2 x 30 sec Cervical rotation and flex/ext smaller ROM   [facility] Adult PT Treatment:                                                DATE: 07/08/22 Therapeutic Exercise: UBE 5 min L1 cues for posture  Pulleys overhead 3 min Standing Rt UE flexion and scaption  x 10 using wall  Ball rolls (red 1000 g) 1 min each direction shoulder height  Flexion, abduction, extension and internal rotation x 10 with dowel standing  Supine ER/IR unattached x 20 (red)   PT assisted ER and IR red band  x 15 at 90 deg Supine 3 lbs x 15 flexion chest press Sidelying ER, 3 lbs x 10 x 2 sets  Sidelying abduction 1 lbs x 10  Manual Therapy: Brief PROM     PATIENT EDUCATION: Education details: Eval findings, POC, , proper posture, pillow for R UE c L SL sleeping Person educated: Patient Education method: Explanation, Demonstration, Tactile cues, Verbal cues, and Handouts Education comprehension: verbalized understanding, returned demonstration, verbal cues required, and tactile cues required     HOME EXERCISE PROGRAM: Access Code: [email or web address]   ASSESSMENT:   CLINICAL IMPRESSION: PT was completed for therex to address anterior chest flexibility, thoracic spine flexibility and posture, and posterior chain strengthening. Pt tolerated PT without adverse effects. Pt's HEP was updated. Pt's intermittent R shoulder pain is less intense, but still occurring with certain R UE movements. Pt will continue to benefit from skilled PT to address impairments to optimize function of the the r UE with less pain.  OBJECTIVE  IMPAIRMENTS decreased ROM, decreased strength, impaired UE functional use, postural dysfunction, and pain.    ACTIVITY LIMITATIONS carrying, lifting, dressing, and caring for others   PARTICIPATION LIMITATIONS: meal prep, cleaning, laundry, driving, shopping, and yard work   PERSONAL FACTORS Past/current experiences and Time since onset of injury/illness/exacerbation are also affecting patient's functional outcome.    REHAB POTENTIAL: Good   CLINICAL DECISION MAKING: Stable/uncomplicated   EVALUATION COMPLEXITY: Low     GOALS:   SHORT TERM GOALS: = LTGs     LONG TERM GOALS: Target 08/1522   Pt will demonstrate 120d of R shoulder flexion for improved R UE function c above shoulder height reaching Baseline: 90d Goal status: INITIAL   2.  Pt will report a decrease in R shoulder pain with daily activities to 0-3/10 for  improved L UE function Baseline: ave. 5/10, 0-10/10 pain range Goal status: INITIAL   3.  Pt's FOTO perceived function score will increase to 65%  Baseline: 51% Goal status: INITIAL   4.  Pt will be ind in a final HEP to maintain achieved LOF Baseline: started on eval Goal status: INITIAL     PLAN: PT FREQUENCY: 2x/week   PT DURATION: 6 weeks   PLANNED INTERVENTIONS: Therapeutic exercises, Therapeutic activity, Patient/Family education, Self Care, Joint mobilization, Aquatic Therapy, Dry Needling, Electrical stimulation, Cryotherapy, Moist heat, Taping, Vasopneumatic device, Ultrasound, Ionotophoresis 4mg /ml Dexamethasone, Manual therapy, and Re-evaluation   PLAN FOR NEXT SESSION: check HEP, posture and strength as tol. Assess LTGs and FOTO.       Kaedyn Belardo MS, PT 07/15/22 5:15 PM

## 2022-07-15 ENCOUNTER — Ambulatory Visit: Payer: Medicare PPO

## 2022-07-15 DIAGNOSIS — M25611 Stiffness of right shoulder, not elsewhere classified: Secondary | ICD-10-CM | POA: Diagnosis not present

## 2022-07-15 DIAGNOSIS — R293 Abnormal posture: Secondary | ICD-10-CM

## 2022-07-15 DIAGNOSIS — G8929 Other chronic pain: Secondary | ICD-10-CM

## 2022-07-15 DIAGNOSIS — M25511 Pain in right shoulder: Secondary | ICD-10-CM | POA: Diagnosis not present

## 2022-07-21 ENCOUNTER — Ambulatory Visit: Payer: Medicare PPO | Admitting: Sports Medicine

## 2022-07-21 VITALS — BP 124/82 | HR 65 | Ht 67.0 in | Wt 139.0 lb

## 2022-07-21 DIAGNOSIS — M7551 Bursitis of right shoulder: Secondary | ICD-10-CM | POA: Diagnosis not present

## 2022-07-21 DIAGNOSIS — M25511 Pain in right shoulder: Secondary | ICD-10-CM | POA: Diagnosis not present

## 2022-07-21 MED ORDER — MELOXICAM 15 MG PO TABS: 15.0000 mg | ORAL_TABLET | Freq: Every day | ORAL | 0 refills | Status: DC

## 2022-07-21 NOTE — Patient Instructions (Addendum)
Good to see you - Start meloxicam 15 mg daily x2 weeks.  If still having pain after 2 weeks, complete 3rd-week of meloxicam. May use remaining meloxicam as needed once daily for pain control.  Do not to use additional NSAIDs while taking meloxicam.  May use Tylenol [phone] mg 2 to 3 times a day for breakthrough pain. Continue PT and HEP 3 week follow up

## 2022-07-22 NOTE — Therapy (Signed)
OUTPATIENT PHYSICAL THERAPY TREATMENT NOTE   Patient Name: Tracey Giles MRN: [id number] DOB:1951-05-12, 71 y.o., female Today's Date: 07/23/2022  PCP: Dr. [name]   REFERRING PROVIDER: Richardean Sale, DO   END OF SESSION:   PT End of Session - 07/23/22 1333     Visit Number 6    Number of Visits 13    Date for PT Re-Evaluation 08/20/22    Authorization Type [employer] CHOICE PPO    Progress Note Due on Visit 10    PT Start Time 1332    PT Stop Time 1415    PT Time Calculation (min) 43 min    Activity Tolerance Patient tolerated treatment well    Behavior During Therapy WFL for tasks assessed/performed                 Past Medical History:  Diagnosis Date   Anxiety    Asthma    exercise induced asthma    Essential hypertension    GERD (gastroesophageal reflux disease)    Heart murmur    mitral valve prolapse   History of dermatitis    History of migraine headaches yrs ago, none recent   Hyperlipemia 05/24/2016   Hypothyroidism    Meniere's disease    NSTEMI (non-ST elevated myocardial infarction) (HCC)    a. s/p NSTEMI in 12/2021 with cath showing normal cors and felt to possibly be secondary to spasm.   Palpitations    Rare PVCs by Holter monitor April 2011    Sleep apnea    Reportedly mild, no cpap needed   Tingling    both legs saw dr Epimenio Foot 04-23-2020 no cause found   Vitamin D insufficiency 05/24/2016   Past Surgical History:  Procedure Laterality Date   APPENDECTOMY  as child   BREAST LUMPECTOMY  yrs ago   Benign   DILATATION & CURETTAGE/HYSTEROSCOPY WITH MYOSURE N/A 05/07/2020   Procedure: DILATATION & CURETTAGE/HYSTEROSCOPY WITH MYOSURE/POLYP REMOVAL;  Surgeon: Romualdo Bolk, MD;  Location: [facility];  Service: Gynecology;  Laterality: N/A;  polyp removal   LEFT HEART CATH AND CORONARY ANGIOGRAPHY N/A 12/12/2021   Procedure: LEFT HEART CATH AND CORONARY ANGIOGRAPHY;  Surgeon: Tonny Bollman, MD;   Location: [facility] INVASIVE CV LAB;  Service: Cardiovascular;  Laterality: N/A;   Patient Active Problem List   Diagnosis Date Noted   Non-ST elevation (NSTEMI) myocardial infarction (HCC) 12/14/2021   Chest pain at rest 12/12/2021   ACS (acute coronary syndrome) (HCC)    Abnormal reflex 10/18/2019   Numbness 10/18/2019   B12 deficiency 10/18/2019   Osteoporosis 01/04/2017   Exercise-induced asthma 11/20/2016   Upper airway cough syndrome 07/01/2016   Dyspnea 07/01/2016   Vitamin D insufficiency 05/24/2016   Hyperlipidemia 05/24/2016   Palpitations 11/15/2014   Meniere's disease 07/21/2007   GASTROESOPHAGEAL REFLUX DISEASE, SEVERE 07/21/2007   Hypothyroidism 07/08/2007   Allergic rhinitis 07/08/2007    REFERRING DIAG: M75.51 (ICD-10-CM) - Subacromial bursitis of right shoulder joint   THERAPY DIAG:  Chronic pain in right shoulder  Stiffness of right shoulder, not elsewhere classified  Abnormal posture  Rationale for Evaluation and Treatment Rehabilitation  PERTINENT HISTORY: MI, osteoporosis  PRECAUTIONS: none  SUBJECTIVE:  Reports R shoulder is feeling some better. Less incidences of intermittent pain. Better shoulder motion with elevating.  PAIN:  Are you having pain? Yes: NPRS scale: 0/10 Pain location: Rt deltoid  Pain description: achy cramp Aggravating factors: lying on R side  Relieving factors: meds, resting  5/10 intermittent with certain R arm movements  OBJECTIVE: (objective measures completed at initial evaluation unless otherwise dated) DIAGNOSTIC FINDINGS:  Xray R shoulder: 04/26/22 IMPRESSION: Very mild glenohumeral and acromioclavicular osteoarthritis.   PATIENT SURVEYS:  FOTO 51% perceived LOF; predicted 65%   COGNITION:           Overall cognitive status: Within functional limits for tasks assessed                                  SENSATION: WFL   POSTURE: Forward head, rounded shoulders, increased kyphosis, R lateral shift of  shoulders   UPPER EXTREMITY ROM:    Active ROM Right eval Left eval Rt 07/23/22  Shoulder flexion 90 125 110d  Shoulder extension       Shoulder abduction       Shoulder adduction       Shoulder internal rotation       Shoulder external rotation       Elbow flexion       Elbow extension       Wrist flexion       Wrist extension       Wrist ulnar deviation       Wrist radial deviation       Wrist pronation       Wrist supination       (Blank rows = not tested)   UPPER EXTREMITY MMT:                       - R shoulder strength found WNLs c only mild provocation of pain MMT Right eval Left eval Rt  Shoulder flexion 4+   5  Shoulder extension 4+     Shoulder abduction 4+     Shoulder adduction 4+     Shoulder internal rotation 4+     Shoulder external rotation 4+     Middle trapezius       Lower trapezius       Elbow flexion       Elbow extension       Wrist flexion       Wrist extension       Wrist ulnar deviation       Wrist radial deviation       Wrist pronation       Wrist supination       Grip strength (lbs)       (Blank rows = not tested)   SHOULDER SPECIAL TESTS:            Impingement tests: Hawkins/Kennedy impingement test: negative            Rotator cuff assessment: Empty can test: negative and Full can test: negative            Biceps assessment: Yergason's test: negative   PALPATION:  TTP lateral GH area             TODAY'S TREATMENT:  [facility] Adult PT Treatment:                                                DATE: 07/23/22 Therapeutic Exercise: UBE L1 4 mins, 2 mins in each direction 90d doorway pectoral stretch x1 30" Shoulder ER doorway stretch x1 30" Serratus wall slides  x10 Standing open book x10 each Seated trunk ext c foam roller x10 Standing row blue band 2 x 15  Standing extension green band 2 x 15  Standing ER red band 2x 15 Modalities: Iontophoresis R shoulder 4mg /58ml, 59ml Precautions re: not getting patch wet and possible skin  reaction    [facility] Adult PT Treatment:                                                DATE: 07/15/22 Therapeutic Exercise: Nustep LE and UE Level 5 , 6 min  90d doorway pectoral stretch x2 30" Shoulder ER doorway stretch Standing open book x10 each Seated trunk ext x10 Standing row blue band 2 x 15 added alternating with longer hold time  Standing extension green band 2 x 15  Standing ER red band 2x 15 Updated HEP  [facility] Adult PT Treatment:                                                DATE: 07/13/22 Therapeutic Exercise: Nustep LE and UE Level 5 , 5 min  Standing row blue band x 15 added alternating with longer hold time  Standing extension green band x 15  Standing ER red band  Diagonal pull red band x 10  Supine chest press 3 lbs x 15 Flexion Rt UE elbow extended 3 lbs x 10  Sidelying ER 3 lbs x 12, then 1 lbs abduction x 12  Seated overhead lift with pilates circle 2 x 8 reps  Wall for posture: goal post x 10, horizontal pull red band x 10 Weight shifts for shoulder flexion and T-extension Seated neck stretches 2 x 30 sec Cervical rotation and flex/ext smaller ROM     PATIENT EDUCATION: Education details: Eval findings, POC, , proper posture, pillow for R UE c L SL sleeping Person educated: Patient Education method: Explanation, Demonstration, Tactile cues, Verbal cues, and Handouts Education comprehension: verbalized understanding, returned demonstration, verbal cues required, and tactile cues required     HOME EXERCISE PROGRAM: Access Code: [email or web address]   ASSESSMENT:   CLINICAL IMPRESSION: Pt demonstrates increased R shoulder strength for flexion and AROM. Pt reports pain is improved. PT was completed to address posture and strengthening of the rotator cuff and peri-scapular muscles. Pt is making appropriate progress. Iontophoresis was completed to the R shoulder to assist further with pain reduction.  OBJECTIVE IMPAIRMENTS decreased ROM, decreased strength, impaired UE  functional use, postural dysfunction, and pain.    ACTIVITY LIMITATIONS carrying, lifting, dressing, and caring for others   PARTICIPATION LIMITATIONS: meal prep, cleaning, laundry, driving, shopping, and yard work   PERSONAL FACTORS Past/current experiences and Time since onset of injury/illness/exacerbation are also affecting patient's functional outcome.    REHAB POTENTIAL: Good   CLINICAL DECISION MAKING: Stable/uncomplicated   EVALUATION COMPLEXITY: Low     GOALS:   SHORT TERM GOALS: = LTGs     LONG TERM GOALS: Target 08/1522   Pt will demonstrate 120d of R shoulder flexion for improved R UE function c above shoulder height reaching Baseline: 90d Status: 07/23/22= 110d Goal status: INITIAL   2.  Pt will report a decrease in R shoulder pain with daily activities to 0-3/10 for improved L  UE function Baseline: ave. 5/10, 0-10/10 pain range Goal status: INITIAL   3.  Pt's FOTO perceived function score will increase to 65%  Baseline: 51% Goal status: INITIAL   4.  Pt will be ind in a final HEP to maintain achieved LOF Baseline: started on eval Goal status: INITIAL     PLAN: PT FREQUENCY: 2x/week   PT DURATION: 6 weeks   PLANNED INTERVENTIONS: Therapeutic exercises, Therapeutic activity, Patient/Family education, Self Care, Joint mobilization, Aquatic Therapy, Dry Needling, Electrical stimulation, Cryotherapy, Moist heat, Taping, Vasopneumatic device, Ultrasound, Ionotophoresis 4mg /ml Dexamethasone, Manual therapy, and Re-evaluation   PLAN FOR NEXT SESSION: check HEP, posture and strength as tol. Assess LTGs and FOTO. Supine therex       MS, PT 07/23/22 4:30 PM

## 2022-07-23 ENCOUNTER — Ambulatory Visit: Payer: Medicare PPO

## 2022-07-23 DIAGNOSIS — R293 Abnormal posture: Secondary | ICD-10-CM | POA: Diagnosis not present

## 2022-07-23 DIAGNOSIS — M25611 Stiffness of right shoulder, not elsewhere classified: Secondary | ICD-10-CM | POA: Diagnosis not present

## 2022-07-23 DIAGNOSIS — G8929 Other chronic pain: Secondary | ICD-10-CM | POA: Diagnosis not present

## 2022-07-23 DIAGNOSIS — M25511 Pain in right shoulder: Secondary | ICD-10-CM | POA: Diagnosis not present

## 2022-07-28 NOTE — Therapy (Signed)
OUTPATIENT PHYSICAL THERAPY TREATMENT NOTE   Patient Name: Tracey Giles MRN: [id number] DOB:1950-12-30, 71 y.o., female Today's Date: 07/29/2022  PCP: Dr. [name]   REFERRING PROVIDER: Richardean Sale, DO   END OF SESSION:   PT End of Session - 07/29/22 1749     Visit Number 7    Number of Visits 13    Date for PT Re-Evaluation 08/20/22    Authorization Type [employer] MEDIC[employer] CHOICE PPO    Progress Note Due on Visit 10    PT Start Time 1338    PT Stop Time 1420    PT Time Calculation (min) 42 min    Activity Tolerance Patient tolerated treatment well    Behavior During Therapy WFL for tasks assessed/performed                  Past Medical History:  Diagnosis Date   Anxiety    Asthma    exercise induced asthma    Essential hypertension    GERD (gastroesophageal reflux disease)    Heart murmur    mitral valve prolapse   History of dermatitis    History of migraine headaches yrs ago, none recent   Hyperlipemia 05/24/2016   Hypothyroidism    Meniere's disease    NSTEMI (non-ST elevated myocardial infarction) (HCC)    a. s/p NSTEMI in 12/2021 with cath showing normal cors and felt to possibly be secondary to spasm.   Palpitations    Rare PVCs by Holter monitor April 2011    Sleep apnea    Reportedly mild, no cpap needed   Tingling    both legs saw dr Epimenio Foot 04-23-2020 no cause found   Vitamin D insufficiency 05/24/2016   Past Surgical History:  Procedure Laterality Date   APPENDECTOMY  as child   BREAST LUMPECTOMY  yrs ago   Benign   DILATATION & CURETTAGE/HYSTEROSCOPY WITH MYOSURE N/A 05/07/2020   Procedure: DILATATION & CURETTAGE/HYSTEROSCOPY WITH MYOSURE/POLYP REMOVAL;  Surgeon: Romualdo Bolk, MD;  Location: [facility] ;  Service: Gynecology;  Laterality: N/A;  polyp removal   LEFT HEART CATH AND CORONARY ANGIOGRAPHY N/A 12/12/2021   Procedure: LEFT HEART CATH AND CORONARY ANGIOGRAPHY;  Surgeon: Tonny Bollman, MD;   Location: [facility] INVASIVE CV [facility];  Service: Cardiovascular;  Laterality: N/A;   Patient Active Problem List   Diagnosis Date Noted   Non-ST elevation (NSTEMI) myocardial infarction (HCC) 12/14/2021   Chest pain at rest 12/12/2021   ACS (acute coronary syndrome) (HCC)    Abnormal reflex 10/18/2019   Numbness 10/18/2019   B12 deficiency 10/18/2019   Osteoporosis 01/04/2017   Exercise-induced asthma 11/20/2016   Upper airway cough syndrome 07/01/2016   Dyspnea 07/01/2016   Vitamin D insufficiency 05/24/2016   Hyperlipidemia 05/24/2016   Palpitations 11/15/2014   Meniere's disease 07/21/2007   GASTROESOPHAGEAL REFLUX DISEASE, SEVERE 07/21/2007   Hypothyroidism 07/08/2007   Allergic rhinitis 07/08/2007    REFERRING DIAG: M75.51 (ICD-10-CM) - Subacromial bursitis of right shoulder joint   THERAPY DIAG:  Chronic pain in right shoulder  Stiffness of right shoulder, not elsewhere classified  Abnormal posture  Rationale for Evaluation and Treatment Rehabilitation  PERTINENT HISTORY: MI, osteoporosis  PRECAUTIONS: none  SUBJECTIVE:  Pt reports her R shoulder continues to improve. She is mostly having pain at the end range of shoulder flexion. Pain with reaching down has made good improvement.  PAIN:  Are you having pain? Yes: NPRS scale: 0/10 Pain location: Rt deltoid  Pain description: achy cramp Aggravating  factors: lying on R side  Relieving factors: meds, resting            5/10 intermittent with certain R arm movements  OBJECTIVE: (objective measures completed at initial evaluation unless otherwise dated) DIAGNOSTIC FINDINGS:  Xray R shoulder: 04/26/22 IMPRESSION: Very mild glenohumeral and acromioclavicular osteoarthritis.   PATIENT SURVEYS:  FOTO 51% perceived LOF; predicted 65%. 07/29/22=48%   COGNITION:           Overall cognitive status: Within functional limits for tasks assessed                                  SENSATION: WFL   POSTURE: Forward head, rounded  shoulders, increased kyphosis, R lateral shift of shoulders   UPPER EXTREMITY ROM:    Active ROM Right eval Left eval Rt 07/23/22  Shoulder flexion 90 125 110d  Shoulder extension       Shoulder abduction       Shoulder adduction       Shoulder internal rotation       Shoulder external rotation       Elbow flexion       Elbow extension       Wrist flexion       Wrist extension       Wrist ulnar deviation       Wrist radial deviation       Wrist pronation       Wrist supination       (Blank rows = not tested)   UPPER EXTREMITY MMT:                       - R shoulder strength found WNLs c only mild provocation of pain MMT Right eval Left eval Rt  Shoulder flexion 4+   5  Shoulder extension 4+     Shoulder abduction 4+     Shoulder adduction 4+     Shoulder internal rotation 4+     Shoulder external rotation 4+     Middle trapezius       Lower trapezius       Elbow flexion       Elbow extension       Wrist flexion       Wrist extension       Wrist ulnar deviation       Wrist radial deviation       Wrist pronation       Wrist supination       Grip strength (lbs)       (Blank rows = not tested)   SHOULDER SPECIAL TESTS:            Impingement tests: Hawkins/Kennedy impingement test: negative            Rotator cuff assessment: Empty can test: negative and Full can test: negative            Biceps assessment: Yergason's test: negative   PALPATION:  TTP lateral GH area             TODAY'S TREATMENT:  [facility] Adult PT Treatment:                                                DATE: 07/29/21 Therapeutic Exercise: UBE L1  4 mins, 2 mins in each direction 90d doorway pectoral stretch x2 30" Shoulder ER doorway stretch x2 30" Shoulder IR sleeper stretch x3 30" Supine shoulder horizontal abd RTB 3x10 Supine shoulder ER RTB 3x10   Manual Therapy: GH posterior mobs grade 3.  STM c MTPR to the infraspinatus, supraspinatus, and upper trap   Self Care: Pt was instructed  in and completed tennis ball massage to the ant and post [facility] muscular   [facility] Adult PT Treatment:                                                DATE: 07/23/22 Therapeutic Exercise: UBE L1 4 mins, 2 mins in each direction 90d doorway pectoral stretch x1 30" Shoulder ER doorway stretch x1 30" Serratus wall slides x10 Standing open book x10 each Seated trunk ext c foam roller x10 Standing row blue band 2 x 15  Standing extension green band 2 x 15  Standing ER red band 2x 15 Modalities: Iontophoresis R shoulder 4mg /58ml, 1ml Precautions re: not getting patch wet and possible skin reaction    [facility] Adult PT Treatment:                                                DATE: 07/15/22 Therapeutic Exercise: Nustep LE and UE Level 5 , 6 min  90d doorway pectoral stretch x2 30" Shoulder ER doorway stretch Standing open book x10 each Seated trunk ext x10 Standing row blue band 2 x 15 added alternating with longer hold time  Standing extension green band 2 x 15  Standing ER red band 2x 15 Updated HEP  [facility] Adult PT Treatment:                                                DATE: 07/13/22 Therapeutic Exercise: Nustep LE and UE Level 5 , 5 min  Standing row blue band x 15 added alternating with longer hold time  Standing extension green band x 15  Standing ER red band  Diagonal pull red band x 10  Supine chest press 3 lbs x 15 Flexion Rt UE elbow extended 3 lbs x 10  Sidelying ER 3 lbs x 12, then 1 lbs abduction x 12  Seated overhead lift with pilates circle 2 x 8 reps  Wall for posture: goal post x 10, horizontal pull red band x 10 Weight shifts for shoulder flexion and T-extension Seated neck stretches 2 x 30 sec Cervical rotation and flex/ext smaller ROM     PATIENT EDUCATION: Education details: Eval findings, POC, , proper posture, pillow for R UE c L SL sleeping Person educated: Patient Education method: Explanation, Demonstration, Tactile cues, Verbal cues, and Handouts Education  comprehension: verbalized understanding, returned demonstration, verbal cues required, and tactile cues required     HOME EXERCISE PROGRAM: Access Code: [email or web address]   ASSESSMENT:   CLINICAL IMPRESSION: Pt is reporting overall R shoulder pain improve. FOTO score did not improve with many of the questions related to above head activities. R shoulder flexion ROM continues to be improved. PT today provided GH mobs  to the posterior capsule, IR stretching, STM and MPTR, and strengthening of the rotator cuff and periscapular muscles.Pt was instructed in tennis ball massage to the R shoulder to address taut muscles and TrPs. Pt tolerated the session without adverse effects.  OBJECTIVE IMPAIRMENTS decreased ROM, decreased strength, impaired UE functional use, postural dysfunction, and pain.    ACTIVITY LIMITATIONS carrying, lifting, dressing, and caring for others   PARTICIPATION LIMITATIONS: meal prep, cleaning, laundry, driving, shopping, and yard work   PERSONAL FACTORS Past/current experiences and Time since onset of injury/illness/exacerbation are also affecting patient's functional outcome.    REHAB POTENTIAL: Good   CLINICAL DECISION MAKING: Stable/uncomplicated   EVALUATION COMPLEXITY: Low     GOALS:   SHORT TERM GOALS: = LTGs     LONG TERM GOALS: Target 08/1522   Pt will demonstrate 120d of R shoulder flexion for improved R UE function c above shoulder height reaching Baseline: 90d Status: 07/23/22= 110d Goal status: INITIAL   2.  Pt will report a decrease in R shoulder pain with daily activities to 0-3/10 for improved L UE function Baseline: ave. 5/10, 0-10/10 pain range Status: ave 3/10, 0-8/10 pain range Goal status: INITIAL   3.  Pt's FOTO perceived function score will increase to 65%  Baseline: 51% Goal status: INITIAL   4.  Pt will be ind in a final HEP to maintain achieved LOF Baseline: started on eval Goal status: INITIAL     PLAN: PT FREQUENCY: 2x/week   PT  DURATION: 6 weeks   PLANNED INTERVENTIONS: Therapeutic exercises, Therapeutic activity, Patient/Family education, Self Care, Joint mobilization, Aquatic Therapy, Dry Needling, Electrical stimulation, Cryotherapy, Moist heat, Taping, Vasopneumatic device, Ultrasound, Ionotophoresis 4mg /ml Dexamethasone, Manual therapy, and Re-evaluation   PLAN FOR NEXT SESSION: check HEP, posture and strength as tol. Assess LTGs and FOTO. Supine therex       MS, PT 07/29/22 6:06 PM

## 2022-07-29 ENCOUNTER — Ambulatory Visit: Payer: Medicare PPO

## 2022-07-29 DIAGNOSIS — G8929 Other chronic pain: Secondary | ICD-10-CM | POA: Diagnosis not present

## 2022-07-29 DIAGNOSIS — R293 Abnormal posture: Secondary | ICD-10-CM

## 2022-07-29 DIAGNOSIS — M25611 Stiffness of right shoulder, not elsewhere classified: Secondary | ICD-10-CM

## 2022-07-29 DIAGNOSIS — M25511 Pain in right shoulder: Secondary | ICD-10-CM | POA: Diagnosis not present

## 2022-07-31 ENCOUNTER — Ambulatory Visit: Payer: Medicare PPO

## 2022-07-31 DIAGNOSIS — M25611 Stiffness of right shoulder, not elsewhere classified: Secondary | ICD-10-CM

## 2022-07-31 DIAGNOSIS — R293 Abnormal posture: Secondary | ICD-10-CM | POA: Diagnosis not present

## 2022-07-31 DIAGNOSIS — G8929 Other chronic pain: Secondary | ICD-10-CM

## 2022-07-31 DIAGNOSIS — M25511 Pain in right shoulder: Secondary | ICD-10-CM | POA: Diagnosis not present

## 2022-07-31 NOTE — Therapy (Addendum)
OUTPATIENT PHYSICAL THERAPY TREATMENT NOTE   Patient Name: Tracey Giles MRN: [id number] DOB:1951/04/11, 71 y.o., female Today's Date: 07/31/2022  PCP: Dr. [name]   REFERRING PROVIDER: Richardean Sale, DO   END OF SESSION:   PT End of Session - 07/31/22 1359     Visit Number 8    Number of Visits 13    Date for PT Re-Evaluation 08/20/22    Authorization Type [employer] CHOICE PPO    Progress Note Due on Visit 10    PT Start Time 1317    PT Stop Time 1400    PT Time Calculation (min) 43 min    Activity Tolerance Patient tolerated treatment well    Behavior During Therapy WFL for tasks assessed/performed                   Past Medical History:  Diagnosis Date   Anxiety    Asthma    exercise induced asthma    Essential hypertension    GERD (gastroesophageal reflux disease)    Heart murmur    mitral valve prolapse   History of dermatitis    History of migraine headaches yrs ago, none recent   Hyperlipemia 05/24/2016   Hypothyroidism    Meniere's disease    NSTEMI (non-ST elevated myocardial infarction) (HCC)    a. s/p NSTEMI in 12/2021 with cath showing normal cors and felt to possibly be secondary to spasm.   Palpitations    Rare PVCs by Holter monitor April 2011    Sleep apnea    Reportedly mild, no cpap needed   Tingling    both legs saw dr Epimenio Foot 04-23-2020 no cause found   Vitamin D insufficiency 05/24/2016   Past Surgical History:  Procedure Laterality Date   APPENDECTOMY  as child   BREAST LUMPECTOMY  yrs ago   Benign   DILATATION & CURETTAGE/HYSTEROSCOPY WITH MYOSURE N/A 05/07/2020   Procedure: DILATATION & CURETTAGE/HYSTEROSCOPY WITH MYOSURE/POLYP REMOVAL;  Surgeon: Romualdo Bolk, MD;  Location: [facility];  Service: Gynecology;  Laterality: N/A;  polyp removal   LEFT HEART CATH AND CORONARY ANGIOGRAPHY N/A 12/12/2021   Procedure: LEFT HEART CATH AND CORONARY ANGIOGRAPHY;  Surgeon: Tonny Bollman, MD;   Location: [facility]VASIVE CV LAB;  Service: Cardiovascular;  Laterality: N/A;   Patient Active Problem List   Diagnosis Date Noted   Non-ST elevation (NSTEMI) myocardial infarction (HCC) 12/14/2021   Chest pain at rest 12/12/2021   ACS (acute coronary syndrome) (HCC)    Abnormal reflex 10/18/2019   Numbness 10/18/2019   B12 deficiency 10/18/2019   Osteoporosis 01/04/2017   Exercise-induced asthma 11/20/2016   Upper airway cough syndrome 07/01/2016   Dyspnea 07/01/2016   Vitamin D insufficiency 05/24/2016   Hyperlipidemia 05/24/2016   Palpitations 11/15/2014   Meniere's disease 07/21/2007   GASTROESOPHAGEAL REFLUX DISEASE, SEVERE 07/21/2007   Hypothyroidism 07/08/2007   Allergic rhinitis 07/08/2007    REFERRING DIAG: M75.51 (ICD-10-CM) - Subacromial bursitis of right shoulder joint   THERAPY DIAG:  Chronic pain in right shoulder  Stiffness of right shoulder, not elsewhere classified  Abnormal posture  Rationale for Evaluation and Treatment Rehabilitation  PERTINENT HISTORY: MI, osteoporosis  PRECAUTIONS: none  SUBJECTIVE:  Pt reports less pain at the end range of R shoulder flexion  PAIN:  Are you having pain? Yes: NPRS scale: 0/10 Pain location: Rt deltoid  Pain description: achy cramp Aggravating factors: lying on R side  Relieving factors: meds, resting  3/10 intermittent with certain R arm movements  OBJECTIVE: (objective measures completed at initial evaluation unless otherwise dated) DIAGNOSTIC FINDINGS:  Xray R shoulder: 04/26/22 IMPRESSION: Very mild glenohumeral and acromioclavicular osteoarthritis.   PATIENT SURVEYS:  FOTO 51% perceived LOF; predicted 65%. 07/29/22=48%   COGNITION:           Overall cognitive status: Within functional limits for tasks assessed                                  SENSATION: WFL   POSTURE: Forward head, rounded shoulders, increased kyphosis, R lateral shift of shoulders   UPPER EXTREMITY ROM:    Active ROM  Right eval Left eval Rt 07/23/22  Shoulder flexion 90 125 110d  Shoulder extension       Shoulder abduction       Shoulder adduction       Shoulder internal rotation       Shoulder external rotation       Elbow flexion       Elbow extension       Wrist flexion       Wrist extension       Wrist ulnar deviation       Wrist radial deviation       Wrist pronation       Wrist supination       (Blank rows = not tested)   UPPER EXTREMITY MMT:                       - R shoulder strength found WNLs c only mild provocation of pain MMT Right eval Left eval Rt  Shoulder flexion 4+   5  Shoulder extension 4+     Shoulder abduction 4+     Shoulder adduction 4+     Shoulder internal rotation 4+     Shoulder external rotation 4+     Middle trapezius       Lower trapezius       Elbow flexion       Elbow extension       Wrist flexion       Wrist extension       Wrist ulnar deviation       Wrist radial deviation       Wrist pronation       Wrist supination       Grip strength (lbs)       (Blank rows = not tested)   SHOULDER SPECIAL TESTS:            Impingement tests: Hawkins/Kennedy impingement test: negative            Rotator cuff assessment: Empty can test: negative and Full can test: negative            Biceps assessment: Yergason's test: negative   PALPATION:  TTP lateral GH area             TODAY'S TREATMENT:  [facility] Adult PT Treatment:                                                DATE: 07/31/22 Therapeutic Exercise: UBE L1 4 mins, 2 mins in each direction 90d doorway pectoral stretch x1 30" Standing open book x10 each Seated trunk ext  c foam roller x10 Serratus forearm wall slides 2x10 Standing row green band 1 x 15  Standing extension green band 1 x 15  Standing ER red band 2 x 10 SL ER 2x 2x10  [facility] Adult PT Treatment:                                                DATE: 07/29/21 Therapeutic Exercise: UBE L1 4 mins, 2 mins in each direction 90d doorway pectoral  stretch x2 30" Shoulder ER doorway stretch x2 30" Shoulder IR sleeper stretch x3 30" Supine shoulder horizontal abd RTB 3x10 Supine shoulder ER RTB 3x10   Manual Therapy: GH posterior mobs grade 3.  STM c MTPR to the infraspinatus, supraspinatus, and upper trap   Self Care: Pt was instructed in and completed tennis ball massage to the ant and post GH muscular    PATIENT EDUCATION: Education details: Eval findings, POC, , proper posture, pillow for R UE c L SL sleeping Person educated: Patient Education method: Explanation, Demonstration, Tactile cues, Verbal cues, and Handouts Education comprehension: verbalized understanding, returned demonstration, verbal cues required, and tactile cues required     HOME EXERCISE PROGRAM: Access Code: [email or web address]   ASSESSMENT:   CLINICAL IMPRESSION: PT focused on rotator cuff and periscapular strengthening and thoracic mobility for improved posture. Pt is most symptomatic for R shoulder pain at the end range of shoulder flexion. Pt notes continued overall improvement of pain. Pt is making appropriate progress and will continue to benefit from skilled PT to address impairments for improve R shoulder/UE function.  OBJECTIVE IMPAIRMENTS decreased ROM, decreased strength, impaired UE functional use, postural dysfunction, and pain.    ACTIVITY LIMITATIONS carrying, lifting, dressing, and caring for others   PARTICIPATION LIMITATIONS: meal prep, cleaning, laundry, driving, shopping, and yard work   PERSONAL FACTORS Past/current experiences and Time since onset of injury/illness/exacerbation are also affecting patient's functional outcome.    REHAB POTENTIAL: Good   CLINICAL DECISION MAKING: Stable/uncomplicated   EVALUATION COMPLEXITY: Low     GOALS:   SHORT TERM GOALS: = LTGs     LONG TERM GOALS: Target 08/1522   Pt will demonstrate 120d of R shoulder flexion for improved R UE function c above shoulder height reaching Baseline:  90d Status: 07/23/22= 110d Goal status: INITIAL   2.  Pt will report a decrease in R shoulder pain with daily activities to 0-3/10 for improved L UE function Baseline: ave. 5/10, 0-10/10 pain range Status: ave 3/10, 0-8/10 pain range Goal status: INITIAL   3.  Pt's FOTO perceived function score will increase to 65%  Baseline: 51% Goal status: INITIAL   4.  Pt will be ind in a final HEP to maintain achieved LOF Baseline: started on eval Goal status: INITIAL     PLAN: PT FREQUENCY: 2x/week   PT DURATION: 6 weeks   PLANNED INTERVENTIONS: Therapeutic exercises, Therapeutic activity, Patient/Family education, Self Care, Joint mobilization, Aquatic Therapy, Dry Needling, Electrical stimulation, Cryotherapy, Moist heat, Taping, Vasopneumatic device, Ultrasound, Ionotophoresis 4mg /ml Dexamethasone, Manual therapy, and Re-evaluation   PLAN FOR NEXT SESSION: check HEP, posture and strength as tol. Assess LTGs and FOTO. Supine therex       MS, PT 07/31/22 2:14 PM

## 2022-08-04 ENCOUNTER — Ambulatory Visit: Payer: Medicare PPO

## 2022-08-04 DIAGNOSIS — M25511 Pain in right shoulder: Secondary | ICD-10-CM | POA: Diagnosis not present

## 2022-08-04 DIAGNOSIS — M25611 Stiffness of right shoulder, not elsewhere classified: Secondary | ICD-10-CM | POA: Diagnosis not present

## 2022-08-04 DIAGNOSIS — R293 Abnormal posture: Secondary | ICD-10-CM

## 2022-08-04 DIAGNOSIS — G8929 Other chronic pain: Secondary | ICD-10-CM

## 2022-08-04 NOTE — Therapy (Signed)
OUTPATIENT PHYSICAL THERAPY TREATMENT NOTE   Patient Name: Tracey Giles MRN: [id number] DOB:1951/12/07, 71 y.o., female Today's Date: 08/04/2022  PCP: Dr. [name]   REFERRING PROVIDER: Richardean Sale, DO   END OF SESSION:           Past Medical History:  Diagnosis Date   Anxiety    Asthma    exercise induced asthma    Essential hypertension    GERD (gastroesophageal reflux disease)    Heart murmur    mitral valve prolapse   History of dermatitis    History of migraine headaches yrs ago, none recent   Hyperlipemia 05/24/2016   Hypothyroidism    Meniere's disease    NSTEMI (non-ST elevated myocardial infarction) (HCC)    a. s/p NSTEMI in 12/2021 with cath showing normal cors and felt to possibly be secondary to spasm.   Palpitations    Rare PVCs by Holter monitor April 2011    Sleep apnea    Reportedly mild, no cpap needed   Tingling    both legs saw dr Epimenio Foot 04-23-2020 no cause found   Vitamin D insufficiency 05/24/2016   Past Surgical History:  Procedure Laterality Date   APPENDECTOMY  as child   BREAST LUMPECTOMY  yrs ago   Benign   DILATATION & CURETTAGE/HYSTEROSCOPY WITH MYOSURE N/A 05/07/2020   Procedure: DILATATION & CURETTAGE/HYSTEROSCOPY WITH MYOSURE/POLYP REMOVAL;  Surgeon: Romualdo Bolk, MD;  Location: [facility];  Service: Gynecology;  Laterality: N/A;  polyp removal   LEFT HEART CATH AND CORONARY ANGIOGRAPHY N/A 12/12/2021   Procedure: LEFT HEART CATH AND CORONARY ANGIOGRAPHY;  Surgeon: Tonny Bollman, MD;  Location: [facility] INVASIVE CV LAB;  Service: Cardiovascular;  Laterality: N/A;   Patient Active Problem List   Diagnosis Date Noted   Non-ST elevation (NSTEMI) myocardial infarction (HCC) 12/14/2021   Chest pain at rest 12/12/2021   ACS (acute coronary syndrome) (HCC)    Abnormal reflex 10/18/2019   Numbness 10/18/2019   B12 deficiency 10/18/2019   Osteoporosis 01/04/2017   Exercise-induced asthma  11/20/2016   Upper airway cough syndrome 07/01/2016   Dyspnea 07/01/2016   Vitamin D insufficiency 05/24/2016   Hyperlipidemia 05/24/2016   Palpitations 11/15/2014   Meniere's disease 07/21/2007   GASTROESOPHAGEAL REFLUX DISEASE, SEVERE 07/21/2007   Hypothyroidism 07/08/2007   Allergic rhinitis 07/08/2007    REFERRING DIAG: M75.51 (ICD-10-CM) - Subacromial bursitis of right shoulder joint   THERAPY DIAG:  Chronic pain in right shoulder  Stiffness of right shoulder, not elsewhere classified  Abnormal posture  Rationale for Evaluation and Treatment Rehabilitation  PERTINENT HISTORY: MI, osteoporosis  PRECAUTIONS: none  SUBJECTIVE:  Pt reports overall improvement in R shoulder pain.   PAIN:  Are you having pain? Yes: NPRS scale: 2/10 Pain location: Rt deltoid  Pain description: achy cramp Aggravating factors: lying on R side  Relieving factors: meds, resting            4/10 at the end range of shoulder flexion  OBJECTIVE: (objective measures completed at initial evaluation unless otherwise dated) DIAGNOSTIC FINDINGS:  Xray R shoulder: 04/26/22 IMPRESSION: Very mild glenohumeral and acromioclavicular osteoarthritis.   PATIENT SURVEYS:  FOTO 51% perceived LOF; predicted 65%. 07/29/22=48%   COGNITION:           Overall cognitive status: Within functional limits for tasks assessed  SENSATION: WFL   POSTURE: Forward head, rounded shoulders, increased kyphosis, R lateral shift of shoulders   UPPER EXTREMITY ROM:    Active ROM Right eval Left eval Rt 07/23/22  Shoulder flexion 90 125 110d  Shoulder extension       Shoulder abduction       Shoulder adduction       Shoulder internal rotation       Shoulder external rotation       Elbow flexion       Elbow extension       Wrist flexion       Wrist extension       Wrist ulnar deviation       Wrist radial deviation       Wrist pronation       Wrist supination       (Blank rows =  not tested)   UPPER EXTREMITY MMT:                       - R shoulder strength found WNLs c only mild provocation of pain MMT Right eval Left eval Rt  Shoulder flexion 4+   5  Shoulder extension 4+     Shoulder abduction 4+     Shoulder adduction 4+     Shoulder internal rotation 4+     Shoulder external rotation 4+     Middle trapezius       Lower trapezius       Elbow flexion       Elbow extension       Wrist flexion       Wrist extension       Wrist ulnar deviation       Wrist radial deviation       Wrist pronation       Wrist supination       Grip strength (lbs)       (Blank rows = not tested)   SHOULDER SPECIAL TESTS:            Impingement tests: Hawkins/Kennedy impingement test: negative            Rotator cuff assessment: Empty can test: negative and Full can test: negative            Biceps assessment: Yergason's test: negative   PALPATION:  TTP lateral GH area             TODAY'S TREATMENT:  [facility] Adult PT Treatment:                                                DATE: 08/04/22 Therapeutic Exercise: UBE L1 4 mins, 2 mins in each direction 90d doorway pectoral stretch x2 30" Scalene stretch x2 15" Standing open book x10 each Standing mid back ext x10, arms crossed in front Serratus forearm wall press outs 2x10 Standing extension green band 1 x 15  SL ER 2x10 3# Shoulder IR sleeper stretch x3 30"  Manual Therapy: GH posterior mobs and inferior glides grade 3.  [facility] Adult PT Treatment:                                                DATE: 07/31/22  Therapeutic Exercise: DATE: 07/31/22 Therapeutic Exercise: UBE L1 4 mins, 2 mins in each direction 90d doorway pectoral stretch x1 30" Standing open book x10 each Seated trunk ext c foam roller x10 Serratus forearm wall slides 2x10 Standing row green band 1 x 15  Standing extension green band 1 x 15  Standing ER red band 2 x 10 SL ER 2x 2x10   [facility] Adult PT Treatment:                                                 DATE: 07/29/21 Therapeutic Exercise: UBE L1 4 mins, 2 mins in each direction 90d doorway pectoral stretch x2 30" Shoulder ER doorway stretch x2 30" Shoulder IR sleeper stretch x3 30" Supine shoulder horizontal abd RTB 3x10 Supine shoulder ER RTB 3x10   Manual Therapy: GH posterior mobs grade 3.  STM c MTPR to the infraspinatus, supraspinatus, and upper trap   Self Care: Pt was instructed in and completed tennis ball massage to the ant and post GH muscular    PATIENT EDUCATION: Education details: Eval findings, POC, , proper posture, pillow for R UE c L SL sleeping Person educated: Patient Education method: Explanation, Demonstration, Tactile cues, Verbal cues, and Handouts Education comprehension: verbalized understanding, returned demonstration, verbal cues required, and tactile cues required     HOME EXERCISE PROGRAM: Access Code: [email or web address]   ASSESSMENT:   CLINICAL IMPRESSION: PT was completed for anterior R upper quarter stretches, thoracic ext ROM, and and rotator cuff and periscapular strengthening to address posture and shoulder girdle function. Pt's report of pain continue to improve. Pt is primarily symptomatic of pain at the end range of shoulder flexion. Pt is making gradual progress. Pt tolerated PT today without adverse effects.  OBJECTIVE IMPAIRMENTS decreased ROM, decreased strength, impaired UE functional use, postural dysfunction, and pain.    ACTIVITY LIMITATIONS carrying, lifting, dressing, and caring for others   PARTICIPATION LIMITATIONS: meal prep, cleaning, laundry, driving, shopping, and yard work   PERSONAL FACTORS Past/current experiences and Time since onset of injury/illness/exacerbation are also affecting patient's functional outcome.    REHAB POTENTIAL: Good   CLINICAL DECISION MAKING: Stable/uncomplicated   EVALUATION COMPLEXITY: Low     GOALS:   SHORT TERM GOALS: = LTGs     LONG TERM GOALS: Target 08/1522   Pt will demonstrate  120d of R shoulder flexion for improved R UE function c above shoulder height reaching Baseline: 90d Status: 07/23/22= 110d Goal status: INITIAL   2.  Pt will report a decrease in R shoulder pain with daily activities to 0-3/10 for improved L UE function Baseline: ave. 5/10, 0-10/10 pain range Status: ave 3/10, 0-8/10 pain range Goal status: INITIAL   3.  Pt's FOTO perceived function score will increase to 65%  Baseline: 51% Goal status: INITIAL   4.  Pt will be ind in a final HEP to maintain achieved LOF Baseline: started on eval Goal status: INITIAL     PLAN: PT FREQUENCY: 2x/week   PT DURATION: 6 weeks   PLANNED INTERVENTIONS: Therapeutic exercises, Therapeutic activity, Patient/Family education, Self Care, Joint mobilization, Aquatic Therapy, Dry Needling, Electrical stimulation, Cryotherapy, Moist heat, Taping, Vasopneumatic device, Ultrasound, Ionotophoresis 4mg /ml Dexamethasone, Manual therapy, and Re-evaluation   PLAN FOR NEXT SESSION: check HEP, posture and strength as tol. Assess LTGs. Assess FOTO last visit  Leotis Isham MS, PT 08/04/22 5:57 PM

## 2022-08-06 ENCOUNTER — Encounter: Payer: Self-pay | Admitting: Physical Therapy

## 2022-08-06 ENCOUNTER — Ambulatory Visit: Payer: Medicare PPO | Admitting: Physical Therapy

## 2022-08-06 DIAGNOSIS — G8929 Other chronic pain: Secondary | ICD-10-CM

## 2022-08-06 DIAGNOSIS — R293 Abnormal posture: Secondary | ICD-10-CM

## 2022-08-06 DIAGNOSIS — M25511 Pain in right shoulder: Secondary | ICD-10-CM | POA: Diagnosis not present

## 2022-08-06 DIAGNOSIS — M25611 Stiffness of right shoulder, not elsewhere classified: Secondary | ICD-10-CM

## 2022-08-06 NOTE — Therapy (Signed)
OUTPATIENT PHYSICAL THERAPY TREATMENT NOTE   Patient Name: Tracey Giles MRN: [id number] DOB:[birth year], 71 y.o., female Today's Date: 08/06/2022     Progress Note Reporting Period 07/02/22 to 08/06/22  See note below for Objective Data and Assessment of Progress/Goals.     PCP: Dr. [name]   REFERRING PROVIDER: Richardean Sale, DO   END OF SESSION:   PT End of Session - 08/06/22 1151     Visit Number 10    Number of Visits 13    Date for PT Re-Evaluation 08/20/22    Authorization Type [employer]IC[employer] CHO[employer] PPO    Progress Note Due on Visit 10    PT Start Time 1147    PT Stop Time 1230    PT Time Calculation (min) 43 min    Activity Tolerance Patient tolerated treatment well    Behavior During Therapy WFL for tasks assessed/performed                    Past Medical History:  Diagnosis Date   Anxiety    Asthma    exercise induced asthma    Essential hypertension    GERD (gastroesophageal reflux disease)    Heart murmur    mitral valve prolapse   History of dermatitis    History of migraine headaches yrs ago, none recent   Hyperlipemia 05/24/2016   Hypothyroidism    Meniere's disease    NSTEMI (non-ST elevated myocardial infarction) (HCC)    a. s/p NSTEMI in 12/2021 with cath showing normal cors and felt to possibly be secondary to spasm.   Palpitations    Rare PVCs by Holter monitor April 2011    Sleep apnea    Reportedly mild, no cpap needed   Tingling    both legs saw dr Epimenio Foot 04-23-2020 no cause found   Vitamin D insufficiency 05/24/2016   Past Surgical History:  Procedure Laterality Date   APPENDECTOMY  as child   BREAST LUMPECTOMY  yrs ago   Benign   DILATATION & CURETTAGE/HYSTEROSCOPY WITH MYOSURE N/A 05/07/2020   Procedure: DILATATION & CURETTAGE/HYSTEROSCOPY WITH MYOSURE/POLYP REMOVAL;  Surgeon: Romualdo Bolk, MD;  Location: [facility];  Service: Gynecology;  Laterality: N/A;  polyp removal    LEFT HEART CATH AND CORONARY ANGIOGRAPHY N/A 12/12/2021   Procedure: LEFT HEART CATH AND CORONARY ANGIOGRAPHY;  Surgeon: Tonny Bollman, MD;  Location: [facility] INVASIVE CV LAB;  Service: Cardiovascular;  Laterality: N/A;   Patient Active Problem List   Diagnosis Date Noted   Non-ST elevation (NSTEMI) myocardial infarction (HCC) 12/14/2021   Chest pain at rest 12/12/2021   ACS (acute coronary syndrome) (HCC)    Abnormal reflex 10/18/2019   Numbness 10/18/2019   B12 deficiency 10/18/2019   Osteoporosis 01/04/2017   Exercise-induced asthma 11/20/2016   Upper airway cough syndrome 07/01/2016   Dyspnea 07/01/2016   Vitamin D insufficiency 05/24/2016   Hyperlipidemia 05/24/2016   Palpitations 11/15/2014   Meniere's disease 07/21/2007   GASTROESOPHAGEAL REFLUX DISEASE, SEVERE 07/21/2007   Hypothyroidism 07/08/2007   Allergic rhinitis 07/08/2007    REFERRING DIAG: M75.51 (ICD-10-CM) - Subacromial bursitis of right shoulder joint   THERAPY DIAG:  Chronic pain in right shoulder  Stiffness of right shoulder, not elsewhere classified  Abnormal posture  Rationale for Evaluation and Treatment Rehabilitation  PERTINENT HISTORY: MI, osteoporosis  PRECAUTIONS: none  SUBJECTIVE:  No complaints today.  Feels 30-50% better overall.  Only pain with reaching up and back , pain stops when I rest.  PAIN:  Are you having pain? Yes: NPRS scale: 2/10 Pain location: Rt deltoid  Pain description: achy cramp Aggravating factors: lying on R side  Relieving factors: meds, resting            4/10 at the end range of shoulder flexion  OBJECTIVE: (objective measures completed at initial evaluation unless otherwise dated) DIAGNOSTIC FINDINGS:  Xray R shoulder: 04/26/22 IMPRESSION: Very mild glenohumeral and acromioclavicular osteoarthritis.   PATIENT SURVEYS:  FOTO 51% perceived LOF; predicted 65%. 07/29/22=48%   COGNITION:           Overall cognitive status: Within functional limits for tasks  assessed                                  SENSATION: WFL   POSTURE: Forward head, rounded shoulders, increased kyphosis, R lateral shift of shoulders   UPPER EXTREMITY ROM:    Active ROM Right eval Left eval Rt 08/06/22  Shoulder flexion 90 125 119  Shoulder extension       Shoulder abduction     105  Shoulder adduction       Shoulder internal rotation     FR to Mid back with discomfort  Shoulder external rotation     Can reach to back of head but upper arm  forward with pain    Elbow flexion       Elbow extension       Wrist flexion       Wrist extension       Wrist ulnar deviation       Wrist radial deviation       Wrist pronation       Wrist supination       (Blank rows = not tested)   UPPER EXTREMITY MMT:                       - R shoulder strength found WNLs c only mild provocation of pain MMT Right eval Left eval Rt 08/06/22  Shoulder flexion 4+   5  Shoulder extension 4+     Shoulder abduction 4+   4+  Shoulder adduction 4+     Shoulder internal rotation 4+   5  Shoulder external rotation 4+   4+  Middle trapezius       Lower trapezius       Elbow flexion       Elbow extension       Wrist flexion       Wrist extension       Wrist ulnar deviation       Wrist radial deviation       Wrist pronation       Wrist supination       Grip strength (lbs)       (Blank rows = not tested)   SHOULDER SPECIAL TESTS:            Impingement tests: Hawkins/Kennedy impingement test: negative            Rotator cuff assessment: Empty can test: negative and Full can test: negative            Biceps assessment: Yergason's test: negative   PALPATION:  TTP lateral GH area             TODAY'S TREATMENT:    [facility] Adult PT Treatment:  DATE: 08/06/22 Therapeutic Exercise: UBE level 1 , 2.5 min each direction  Seated shoulder strengthening with looped band: ER, flexion to 90 deg and then overhead as able  Scapular adduction with  arms in goal post  x 10 Green therband row and extension x 15  Green TB diagonal pull Rt UE x 10  Horiz pull on wall red x 10 then x 10 green  Scapular glide forearms on foam roller x 10   Sidelying ER , 3 lbs x 15  Sidelying abduction, reverse fly 2 lbs x 15   Manual Therapy: MMT , ROM no chrg PROM Rt UE all planes to tolerance   [facility] Adult PT Treatment:                                                DATE: 08/04/22 Therapeutic Exercise: UBE L1 4 mins, 2 mins in each direction 90d doorway pectoral stretch x2 30" Scalene stretch x2 15" Standing open book x10 each Standing mid back ext x10, arms crossed in front Serratus forearm wall press outs 2x10 Standing extension green band 1 x 15  SL ER 2x10 3# Shoulder IR sleeper stretch x3 30"  Manual Therapy: GH posterior mobs and inferior glides grade 3.    PATIENT EDUCATION: Education details: Eval findings, POC, , proper posture, pillow for R UE c L SL sleeping Person educated: Patient Education method: Explanation, Demonstration, Tactile cues, Verbal cues, and Handouts Education comprehension: verbalized understanding, returned demonstration, verbal cues required, and tactile cues required     HOME EXERCISE PROGRAM: Access Code: [facility]  Access Code: [facility] URL: [email or web address] Date: 08/06/2022 Prepared by: Karie Mainland  Exercises - Seated Passive Cervical Retraction  - 6 x daily - 7 x weekly - 1 sets - 5 reps - 3 hold - Seated Scapular Retraction  - 6 x daily - 7 x weekly - 1 sets - 5 reps - 3 hold - Standing Shoulder Row with Anchored Resistance  - 2 x daily - 7 x weekly - 2 sets - 10 reps - 2 hold - Shoulder extension with resistance - Neutral  - 2 x daily - 7 x weekly - 2 sets - 10 reps - 2 hold - Seated Upper Trapezius Stretch  - 2 x daily - 7 x weekly - 1 sets - 3 reps - 30 hold - Seated Single Arm Shoulder External Rotation with Self-Anchored Resistance  - 2 x daily - 7 x weekly - 2 sets - 10  reps - 5 hold - Doorway Pec Stretch at 90 Degrees Abduction  - 1 x daily - 7 x weekly - 1 sets - 2 reps - 30 hold - Standing Shoulder External Rotation Stretch in Doorway  - 1 x daily - 7 x weekly - 1 sets - 2 reps - 30 hold - Standing Thoracic Open Book at Wall  - 1 x daily - 7 x weekly - 1 sets - 10 reps - 5 hold - Seated Thoracic Extension with Hands Behind Neck  - 1 x daily - 7 x weekly - 1 sets - 10 reps - Sleeper Stretch  - 1 x daily - 7 x weekly - 1 sets - 3 reps - 30 hold - Standing Shoulder Horizontal Abduction with Resistance  - 1 x daily - 7 x weekly - 2 sets - 10 reps - 5  hold   ASSESSMENT:   CLINICAL IMPRESSION: Patient has one more visit next week.  She is looking forward to seeing Dr. Jean Rosenthal to what else he recommends.  She feels that one more may be enough for her to strengthen on her own.  Session was completed for anterior R upper quarter stretches, thoracic ext ROM, and and rotator cuff and periscapular strengthening to address posture and shoulder girdle function.     OBJECTIVE IMPAIRMENTS decreased ROM, decreased strength, impaired UE functional use, postural dysfunction, and pain.    ACTIVITY LIMITATIONS carrying, lifting, dressing, and caring for others   PARTICIPATION LIMITATIONS: meal prep, cleaning, laundry, driving, shopping, and yard work   PERSONAL FACTORS Past/current experiences and Time since onset of injury/illness/exacerbation are also affecting patient's functional outcome.    REHAB POTENTIAL: Good   CLINICAL DECISION MAKING: Stable/uncomplicated   EVALUATION COMPLEXITY: Low     GOALS:   SHORT TERM GOALS: = LTGs     LONG TERM GOALS: Target 08/1522   Pt will demonstrate 120d of R shoulder flexion for improved R UE function c above shoulder height reaching Baseline: 90d Status: 07/23/22= 110d Goal status: ongoing    2.  Pt will report a decrease in R shoulder pain with daily activities to 0-3/10 for improved L UE function Baseline: ave. 5/10,  0-10/10 pain range Status: 5/10 but then goes away  Goal status:ongoing    3.  Pt's FOTO perceived function score will increase to 65%  Baseline: 51% Goal status: INITIAL   4.  Pt will be ind in a final HEP to maintain achieved LOF Baseline: started on eval Goal status: INITIAL     PLAN: PT FREQUENCY: 2x/week   PT DURATION: 6 weeks   PLANNED INTERVENTIONS: Therapeutic exercises, Therapeutic activity, Patient/Family education, Self Care, Joint mobilization, Aquatic Therapy, Dry Needling, Electrical stimulation, Cryotherapy, Moist heat, Taping, Vasopneumatic device, Ultrasound, Ionotophoresis 4mg /ml Dexamethasone, Manual therapy, and Re-evaluation   PLAN FOR NEXT SESSION: check HEP, posture and strength as tol. Assess LTGs. Assess FOTO last visit    , PT 08/06/22 1:00 PM Phone: [phone] Fax: [phone]

## 2022-08-07 NOTE — Progress Notes (Unsigned)
    Aleen Sells D.[facility] [address] [place] [address] Phone: [phone]   Assessment and Plan:     There are no diagnoses linked to this encounter.  ***   Pertinent previous records reviewed include ***   Follow Up: ***     Subjective:   I, Tracey Giles, am serving as a Neurosurgeon for Doctor Richardean Sale   Chief Complaint: right shoulder pain    HPI:    06/16/2022 Patient is a 71 year old female complaining of right shoulder pain. Patient statesb that for about 6 months she has had pain on her bicep to the point where she cant lift the arm and its had for her to wash her hair, no MOI , no numbness or tingling , has not been taking meds for the pain , decreased ROM   07/21/2022 Patient states thinks she might be a little bette, not where she wants to be thinks PT is helping a little    08/11/2022 Patient states   Relevant Historical Information: Hypothyroidism, Mnire's   Additional pertinent review of systems negative.   Current Outpatient Medications:    acetaminophen (TYLENOL) 500 MG tablet, Take 1,000 mg by mouth every 6 (six) hours as needed (pain)., Disp: , Rfl:    albuterol (VENTOLIN HFA) 108 (90 Base) MCG/ACT inhaler, Inhale 2 puffs into the lungs every 6 (six) hours as needed. (Patient taking differently: Inhale 2 puffs into the lungs every 6 (six) hours as needed for wheezing or shortness of breath.), Disp: 8.5 g, Rfl: 1   amLODipine (NORVASC) 2.5 MG tablet, Take 1 tablet (2.5 mg total) by mouth daily., Disp: 90 tablet, Rfl: 3   aspirin EC 81 MG EC tablet, Take 1 tablet (81 mg total) by mouth daily. Swallow whole., Disp: 90 tablet, Rfl: 2   atorvastatin (LIPITOR) 10 MG tablet, Take 1 tablet (10 mg total) by mouth daily., Disp: 90 tablet, Rfl: 3   cholecalciferol (VITAMIN D) 25 MCG (1000 UNIT) tablet, Take 1,000 Units by mouth every morning., Disp: , Rfl:    Cyanocobalamin (B-12) 1000 MCG TABS, Take 1,000 mcg by  mouth every morning., Disp: , Rfl:    FOLIC ACID PO, Take 1 tablet by mouth every morning., Disp: , Rfl:    levocetirizine (XYZAL) 5 MG tablet, Take 1 tablet (5 mg total) by mouth every evening., Disp: 30 tablet, Rfl: 5   LORazepam (ATIVAN) 0.5 MG tablet, Take 0.5-1 tablets (0.25-0.5 mg total) by mouth every 8 (eight) hours as needed for anxiety., Disp: 10 tablet, Rfl: 0   meloxicam (MOBIC) 15 MG tablet, Take 1 tablet (15 mg total) by mouth daily., Disp: 30 tablet, Rfl: 0   metoprolol succinate (TOPROL-XL) 100 MG 24 hr tablet, Take 1 tablet (100 mg total) by mouth daily after supper., Disp: 90 tablet, Rfl: 1   omeprazole (PRILOSEC OTC) 20 MG tablet, Take 20 mg by mouth daily as needed (acid reflux/heartburn)., Disp: , Rfl:    SYNTHROID 75 MCG tablet, Take 1 tablet (75 mcg total) by mouth daily before breakfast., Disp: 90 tablet, Rfl: 1   Objective:     There were no vitals filed for this visit.    There is no height or weight on file to calculate BMI.    Physical Exam:    ***   Electronically signed by:  Aleen Sells D.[facility] 7:51 AM 08/07/22

## 2022-08-11 ENCOUNTER — Ambulatory Visit: Payer: Medicare PPO | Admitting: Sports Medicine

## 2022-08-11 VITALS — BP 120/68 | HR 56 | Ht 67.0 in | Wt 132.0 lb

## 2022-08-11 DIAGNOSIS — M7551 Bursitis of right shoulder: Secondary | ICD-10-CM | POA: Diagnosis not present

## 2022-08-11 DIAGNOSIS — M25511 Pain in right shoulder: Secondary | ICD-10-CM | POA: Diagnosis not present

## 2022-08-11 NOTE — Patient Instructions (Addendum)
Good to see you Stop meloxicam  4 week follow could consider CSI (steroid injection)

## 2022-08-12 ENCOUNTER — Ambulatory Visit: Payer: Medicare PPO | Attending: Cardiology

## 2022-08-12 DIAGNOSIS — M25511 Pain in right shoulder: Secondary | ICD-10-CM | POA: Insufficient documentation

## 2022-08-12 DIAGNOSIS — G8929 Other chronic pain: Secondary | ICD-10-CM | POA: Insufficient documentation

## 2022-08-12 DIAGNOSIS — R293 Abnormal posture: Secondary | ICD-10-CM | POA: Insufficient documentation

## 2022-08-12 DIAGNOSIS — M25611 Stiffness of right shoulder, not elsewhere classified: Secondary | ICD-10-CM | POA: Diagnosis not present

## 2022-08-12 NOTE — Therapy (Signed)
OUTPATIENT PHYSICAL THERAPY TREATMENT NOTE/Discharge   Patient Name: Tracey Giles MRN: [id number] DOB:04-01-1951, 71 y.o., female Today's Date: 08/12/2022 .     PCP: Dr. Micheline Rough   REFERRING PROVIDER: Glennon Mac, DO   END OF SESSION:   PT End of Session - 08/12/22 1300     Visit Number 11    Date for PT Re-Evaluation 08/20/22    Authorization Type [employer] MEDICARE CHOICE PPO    PT Start Time 1155    PT Stop Time 1238    PT Time Calculation (min) 43 min    Activity Tolerance Patient tolerated treatment well    Behavior During Therapy WFL for tasks assessed/performed                     Past Medical History:  Diagnosis Date   Anxiety    Asthma    exercise induced asthma    Essential hypertension    GERD (gastroesophageal reflux disease)    Heart murmur    mitral valve prolapse   History of dermatitis    History of migraine headaches yrs ago, none recent   Hyperlipemia 05/24/2016   Hypothyroidism    Meniere's disease    NSTEMI (non-ST elevated myocardial infarction) ([place])    a. s/p NSTEMI in 12/2021 with cath showing normal cors and felt to possibly be secondary to spasm.   Palpitations    Rare PVCs by Holter monitor April 2011    Sleep apnea    Reportedly mild, no cpap needed   Tingling    both legs saw dr Felecia Shelling 04-23-2020 no cause found   Vitamin D insufficiency 05/24/2016   Past Surgical History:  Procedure Laterality Date   APPENDECTOMY  as child   BREAST LUMPECTOMY  yrs ago   [place] N/A 05/07/2020   Procedure: DILATATION & CURETTAGE/HYSTEROSCOPY WITH MYOSURE/POLYP REMOVAL;  Surgeon: Salvadore Dom, MD;  Location: [place];  Service: Gynecology;  Laterality: N/A;  polyp removal   LEFT HEART CATH AND CORONARY ANGIOGRAPHY N/A 12/12/2021   Procedure: LEFT HEART CATH AND CORONARY ANGIOGRAPHY;  Surgeon: Sherren Mocha, MD;  Location: [place] [facility];   Service: Cardiovascular;  Laterality: N/A;   Patient Active Problem List   Diagnosis Date Noted   Non-ST elevation (NSTEMI) myocardial infarction ([place]) 12/14/2021   Chest pain at rest 12/12/2021   ACS (acute coronary syndrome) ([place])    Abnormal reflex 10/18/2019   Numbness 10/18/2019   B12 deficiency 10/18/2019   Osteoporosis 01/04/2017   Exercise-induced asthma 11/20/2016   Upper airway cough syndrome 07/01/2016   Dyspnea 07/01/2016   Vitamin D insufficiency 05/24/2016   Hyperlipidemia 05/24/2016   Palpitations 11/15/2014   Meniere's disease 07/21/2007   GASTROESOPHAGEAL REFLUX DISEASE, SEVERE 07/21/2007   Hypothyroidism 07/08/2007   Allergic rhinitis 07/08/2007    REFERRING DIAG: M75.51 (ICD-10-CM) - Subacromial bursitis of right shoulder joint   THERAPY DIAG:  Chronic pain in right shoulder  Stiffness of right shoulder, not elsewhere classified  Abnormal posture  Rationale for Evaluation and Treatment Rehabilitation  PERTINENT HISTORY: MI, osteoporosis  PRECAUTIONS: none  SUBJECTIVE:  Pt reports 80% improvement in her R shoulder/UE function and pain. 0/10 at rest.  PAIN:  Are you having pain? Yes: NPRS scale: 0/10 Pain location: Rt deltoid  Pain description: achy cramp Aggravating factors: lying on R side  Relieving factors: meds, resting            3-4/10 at  the end range of shoulder flexion  OBJECTIVE: (objective measures completed at initial evaluation unless otherwise dated) DIAGNOSTIC FINDINGS:  Xray R shoulder: 04/26/22 IMPRESSION: Very mild glenohumeral and acromioclavicular osteoarthritis.   PATIENT SURVEYS:  FOTO 51% perceived LOF; predicted 65%. 07/29/22=48%. 08/12/22=62%   COGNITION:           Overall cognitive status: Within functional limits for tasks assessed                                  SENSATION: WFL   POSTURE: Forward head, rounded shoulders, increased kyphosis, R lateral shift of shoulders   UPPER EXTREMITY ROM:    Active ROM  Right eval Left eval Rt 08/06/22 Rt 08/12/22  Shoulder flexion 90 125 119 121  Shoulder extension        Shoulder abduction     105   Shoulder adduction        Shoulder internal rotation     FR to Mid back with discomfort   Shoulder external rotation     Can reach to back of head but upper arm  forward with pain     Elbow flexion        Elbow extension        Wrist flexion        Wrist extension        Wrist ulnar deviation        Wrist radial deviation        Wrist pronation        Wrist supination        (Blank rows = not tested)   UPPER EXTREMITY MMT:                       - R shoulder strength found WNLs c only mild provocation of pain MMT Right eval Left eval Rt 08/06/22  Shoulder flexion 4+   5  Shoulder extension 4+     Shoulder abduction 4+   4+  Shoulder adduction 4+     Shoulder internal rotation 4+   5  Shoulder external rotation 4+   4+  Middle trapezius       Lower trapezius       Elbow flexion       Elbow extension       Wrist flexion       Wrist extension       Wrist ulnar deviation       Wrist radial deviation       Wrist pronation       Wrist supination       Grip strength (lbs)       (Blank rows = not tested)   SHOULDER SPECIAL TESTS:            Impingement tests: Hawkins/Kennedy impingement test: negative            Rotator cuff assessment: Empty can test: negative and Full can test: negative            Biceps assessment: Yergason's test: negative   PALPATION:  TTP lateral GH area             TODAY'S TREATMENT:  [facility] Adult PT Treatment:  DATE: 08/12/22 Therapeutic Exercise: UBE level 1 , 2.5 min each direction  Shoulder height 2,3,5,10# and overhead lifting 2,3,5# Cervical retraction x5 3' scapular retraction x5 3' Upper trap stretch x2 30" Sleeper stretch x2 30" Dorrway pec stretch 1x30" R shoulder ER stretch 1x30 Standing shoulder hor abd x10 RTB Shoulder row x15 GTB Shoulder ext x15  GTB Shoulder ER x10 RTB Reviewed final HEP Self Care: FOTO reassessed and reviewed  [facility] Adult PT Treatment:                                                DATE: 08/06/22 Therapeutic Exercise: UBE level 1 , 2.5 min each direction  Seated shoulder strengthening with looped band: ER, flexion to 90 deg and then overhead as able  Scapular adduction with arms in goal post  x 10 Green therband row and extension x 15  Green TB diagonal pull Rt UE x 10  Horiz pull on wall red x 10 then x 10 green  Scapular glide forearms on foam roller x 10   Sidelying ER , 3 lbs x 15  Sidelying abduction, reverse fly 2 lbs x 15   Manual Therapy: MMT , ROM no chrg PROM Rt UE all planes to tolerance   PATIENT EDUCATION: Education details: Eval findings, POC, , proper posture, pillow for R UE c L SL sleeping Person educated: Patient Education method: Explanation, Demonstration, Tactile cues, Verbal cues, and Handouts Education comprehension: verbalized understanding, returned demonstration, verbal cues required, and tactile cues required     HOME EXERCISE PROGRAM: Access Code: [facility] URL: [email or web address] Date: 08/12/2022 Prepared by: Gar Ponto  Exercises - Seated Passive Cervical Retraction  - 6 x daily - 7 x weekly - 1 sets - 5 reps - 3 hold - Seated Scapular Retraction  - 6 x daily - 7 x weekly - 1 sets - 5 reps - 3 hold - Seated Thoracic Extension with Hands Behind Neck  - 1 x daily - 7 x weekly - 1 sets - 10 reps - Standing Thoracic Open Book at [place]  - 1 x daily - 7 x weekly - 1 sets - 10 reps - 5 hold - Doorway Pec Stretch at 90 Degrees Abduction  - 1 x daily - 7 x weekly - 1 sets - 2 reps - 30 hold - Seated Upper Trapezius Stretch  - 2 x daily - 7 x weekly - 1 sets - 3 reps - 30 hold - Standing Shoulder External Rotation Stretch in Doorway  - 1 x daily - 7 x weekly - 1 sets - 2 reps - 30 hold - Sleeper Stretch  - 1 x daily - 7 x weekly - 1 sets - 3 reps - 30 hold -  Standing Shoulder Horizontal Abduction with Resistance  - 1 x daily - 7 x weekly - 2 sets - 10 reps - 5 hold - Standing Shoulder Row with Anchored Resistance  - 2 x daily - 7 x weekly - 2 sets - 10 reps - 2 hold - Shoulder extension with resistance - Neutral  - 2 x daily - 7 x weekly - 2 sets - 10 reps - 2 hold - Seated Single Arm Shoulder External Rotation with Self-Anchored Resistance  - 2 x daily - 7 x weekly - 2 sets - 10 reps - 5 hold   ASSESSMENT:  CLINICAL IMPRESSION: Pt completed her final PT session today. Pt completed therex to address posture, rotator cuff and peri-scapular strengthening, and shoulder ROM. Pt demonstrated proper understanding of her final HEP. Pt has made good progress with her shoulder ROM, strength, pain and functional use. Pt reports 80% improved in R shoulder/UE function and pain. Pt has the most difficulty with pain at the end range of shoulder elevation. Pt is DC with all goals improved or met.  OBJECTIVE IMPAIRMENTS decreased ROM, decreased strength, impaired UE functional use, postural dysfunction, and pain.    ACTIVITY LIMITATIONS carrying, lifting, dressing, and caring for others   PARTICIPATION LIMITATIONS: meal prep, cleaning, laundry, driving, shopping, and yard work   PERSONAL FACTORS Past/current experiences and Time since onset of injury/illness/exacerbation are also affecting patient's functional outcome.    REHAB POTENTIAL: Good   CLINICAL DECISION MAKING: Stable/uncomplicated   EVALUATION COMPLEXITY: Low     GOALS:   SHORT TERM GOALS: = LTGs     LONG TERM GOALS: Target 08/1522   Pt will demonstrate 120d of R shoulder flexion for improved R UE function c above shoulder height reaching Baseline: 90d Status: 07/23/22= 110d. 08/12/22=121d Goal status: MET    2.  Pt will report a decrease in R shoulder pain with daily activities to 0-3/10 for improved L UE function Baseline: ave. 5/10, 0-10/10 pain range Status: 5/10 but then goes away.  08/12/22= 0/10at rest, 3-4/10 at end range of flexion  Goal status:MET   3.  Pt's FOTO perceived function score will increase to 65%  Baseline: 51% Status: 08/12/22= 62% Goal status: Improved   4.  Pt will be ind in a final HEP to maintain achieved LOF Baseline: started on eval Goal status: MET     PLAN: PT FREQUENCY: 2x/week   PT DURATION: 6 weeks   PLANNED INTERVENTIONS: Therapeutic exercises, Therapeutic activity, Patient/Family education, Self Care, Joint mobilization, Aquatic Therapy, Dry Needling, Electrical stimulation, Cryotherapy, Moist heat, Taping, Vasopneumatic device, Ultrasound, Ionotophoresis 23m/ml Dexamethasone, Manual therapy, and Re-evaluation   PLAN FOR NEXT SESSION:   PHYSICAL THERAPY DISCHARGE SUMMARY  Visits from Start of Care: 11  Current functional level related to goals / functional outcomes: See clinical impression and goals   Remaining deficits: See clinical impression and goals   Education / Equipment: HEP   Patient agrees to discharge. Patient goals were  Met or imprpoved . Patient is being discharged due to being pleased with the current functional level.    Rayni Nemitz MS, PT 08/12/22 1:14 PM

## 2022-08-21 ENCOUNTER — Ambulatory Visit: Payer: Medicare PPO | Admitting: Family Medicine

## 2022-08-21 ENCOUNTER — Encounter: Payer: Self-pay | Admitting: Family Medicine

## 2022-08-21 VITALS — BP 132/80 | HR 55 | Temp 98.4°F | Ht 67.0 in | Wt 132.4 lb

## 2022-08-21 DIAGNOSIS — I10 Essential (primary) hypertension: Secondary | ICD-10-CM | POA: Diagnosis not present

## 2022-08-21 DIAGNOSIS — Z23 Encounter for immunization: Secondary | ICD-10-CM

## 2022-08-21 DIAGNOSIS — R143 Flatulence: Secondary | ICD-10-CM

## 2022-08-21 NOTE — Progress Notes (Unsigned)
Established Patient Office Visit  Subjective   Patient ID: Tracey Giles, female    DOB: June 09, 1951  Age: 71 y.o. MRN: [id number]  Chief Complaint  Patient presents with   patient complains of "fluttering sensation" lower abd x1 wk    Patient is reporting "fluttering in the pelvic area" for about a week, no fever/chills, no pain in the area just fluttering. No nausea or vomiting, no diarrhea but there was a mild amount of constipation but this has resolved. No diet changes that she can think of. No other associated symptoms. I reviewed her medications and her current problem list.   HTN -- BP in office performed and is well controlled. She reports no side effects to the medications, no chest pain, SOB, dizziness or headaches. She has a BP cuff at home and is checking her BP regularly, reports they are in the normal range.     Current Outpatient Medications  Medication Instructions   acetaminophen (TYLENOL) 1,000 mg, Oral, Every 6 hours PRN   albuterol (VENTOLIN HFA) 108 (90 Base) MCG/ACT inhaler 2 puffs, Inhalation, Every 6 hours PRN   amLODipine (NORVASC) 2.5 mg, Oral, Daily   aspirin EC 81 mg, Oral, Daily, Swallow whole.   atorvastatin (LIPITOR) 10 mg, Oral, Daily   B-12 1,000 mcg, Oral, Every morning   cholecalciferol (VITAMIN D3) 1,000 Units, Oral, Every morning   FOLIC ACID PO 1 tablet, Oral, Every morning   levocetirizine (XYZAL) 5 mg, Oral, Every evening   LORazepam (ATIVAN) 0.25-0.5 mg, Oral, Every 8 hours PRN   meloxicam (MOBIC) 15 mg, Oral, Daily   metoprolol succinate (TOPROL-XL) 100 mg, Oral, Daily after supper   omeprazole (PRILOSEC OTC) 20 mg, Oral, Daily PRN   Synthroid 75 mcg, Oral, Daily before breakfast     Patient Active Problem List   Diagnosis Date Noted   Intestinal gas excretion 08/27/2022   Essential hypertension    Non-ST elevation (NSTEMI) myocardial infarction ([facility]) 12/14/2021   Chest pain at rest 12/12/2021   ACS (acute coronary syndrome)  ([facility])    Abnormal reflex 10/18/2019   Numbness 10/18/2019   B12 deficiency 10/18/2019   Osteoporosis 01/04/2017   Exercise-induced asthma 11/20/2016   Upper airway cough syndrome 07/01/2016   Dyspnea 07/01/2016   Vitamin D insufficiency 05/24/2016   Hyperlipidemia 05/24/2016   Palpitations 11/15/2014   Meniere's disease 07/21/2007   GASTROESOPHAGEAL REFLUX DISEASE, SEVERE 07/21/2007   Hypothyroidism 07/08/2007   Allergic rhinitis 07/08/2007      Review of Systems  All other systems reviewed and are negative.     Objective:     BP 132/80 (BP Location: Left Arm, Cuff Size: Normal)   Pulse (!) 55   Temp 98.4 F (36.9 C) (Oral)   Ht 5\' 7"  (1.702 m)   Wt 132 lb 6.4 oz (60.1 kg)   SpO2 100%   BMI 20.74 kg/m    Physical Exam Constitutional:      Appearance: Normal appearance. She is normal weight.  Cardiovascular:     Rate and Rhythm: Normal rate and regular rhythm.  Pulmonary:     Effort: Pulmonary effort is normal.     Breath sounds: Normal breath sounds. No wheezing, rhonchi or rales.  Abdominal:     General: Bowel sounds are normal. There is no distension.     Palpations: Abdomen is soft.     Tenderness: There is no abdominal tenderness. There is no guarding.  Neurological:     Mental Status: She is alert.  No results found for any visits on 08/21/22.  Last lipids Lab Results  Component Value Date   CHOL 135 07/03/2022   HDL 60 07/03/2022   LDLCALC 55 07/03/2022   TRIG 98 07/03/2022   CHOLHDL 2.3 07/03/2022      The ASCVD Risk score (Arnett DK, et al., 2019) failed to calculate for the following reasons:   The patient has a prior MI or stroke diagnosis    Assessment & Plan:   Problem List Items Addressed This Visit       Cardiovascular and Mediastinum   Essential hypertension    Current hypertension medications:       Sig   amLODipine (NORVASC) 2.5 MG tablet (Taking) Take 1 tablet (2.5 mg total) by mouth daily.   metoprolol succinate  (TOPROL-XL) 100 MG 24 hr tablet (Taking) Take 1 tablet (100 mg total) by mouth daily after supper.  BP in office is WNL today, well controlled, continue the above medication as prescribed        Other   Intestinal gas excretion    Fluttering most likely an increase in gaseous production. We discussed OTC gas relief methods including Gas X and mylicon tablets to help reduce the gas. Also I recommended a good bowel regimen consisting of probiotics OTC and to increase her fiber content.       Other Visit Diagnoses     Need for immunization against influenza    -  Primary   Relevant Orders   Flu Vaccine QUAD High Dose(Fluad) (Completed)       Return in about 6 months (around 02/19/2023) for follow up, will need her annual labs.    Karie Georges, MD

## 2022-08-21 NOTE — Patient Instructions (Addendum)
Increase fiber-and try taking probiotics -- can you active culture yogurt to help regulate your bowel.  COVID booster and RSV vaccine are due.

## 2022-08-27 DIAGNOSIS — I10 Essential (primary) hypertension: Secondary | ICD-10-CM | POA: Insufficient documentation

## 2022-08-27 DIAGNOSIS — R143 Flatulence: Secondary | ICD-10-CM | POA: Insufficient documentation

## 2022-08-27 NOTE — Assessment & Plan Note (Signed)
Current hypertension medications:      Sig   amLODipine (NORVASC) 2.5 MG tablet (Taking) Take 1 tablet (2.5 mg total) by mouth daily.   metoprolol succinate (TOPROL-XL) 100 MG 24 hr tablet (Taking) Take 1 tablet (100 mg total) by mouth daily after supper.     BP in office is WNL today, well controlled, continue the above medication as prescribed

## 2022-08-27 NOTE — Assessment & Plan Note (Signed)
Fluttering most likely an increase in gaseous production. We discussed OTC gas relief methods including Gas X and mylicon tablets to help reduce the gas. Also I recommended a good bowel regimen consisting of probiotics OTC and to increase her fiber content.

## 2022-09-07 NOTE — Progress Notes (Signed)
Tracey Giles Tracey Giles Tracey Giles Tracey Giles Phone: [phone]   Assessment and Plan:     1. Pain in joint of right shoulder 2. Subacromial bursitis of right shoulder joint  -Chronic with significant improvement, subsequent visit - Nearly full relief of shoulder pain after completion of physical therapy, CSI on 06/16/2022, HEP and course of meloxicam - Fully cleared to restart all physical activity as tolerated  Pertinent previous records reviewed include none   Follow Up: As needed   Subjective:   I, Tracey Giles, am serving as a Education administrator for Doctor Tracey Giles   Chief Complaint: right shoulder pain    HPI:    06/16/2022 Patient is a 71 year old female complaining of right shoulder pain. Patient statesb that for about 6 months she has had pain on her bicep to the point where she cant lift the arm and its had for her to wash her hair, no MOI , no numbness or tingling , has not been taking meds for the pain , decreased ROM   07/21/2022 Patient states thinks she might be a little bette, not where she wants to be thinks PT is helping a little    08/11/2022 Patient states that she is better , probably 80% better     09/08/2022 Patient states that she is a whole lot better only has intermittent pain    Relevant Historical Information: Hypothyroidism, Mnire's   Additional pertinent review of systems negative.   Current Outpatient Medications:    acetaminophen (TYLENOL) 500 MG tablet, Take 1,000 mg by mouth every 6 (six) hours as needed (pain)., Disp: , Rfl:    albuterol (VENTOLIN HFA) 108 (90 Base) MCG/ACT inhaler, Inhale 2 puffs into the lungs every 6 (six) hours as needed. (Patient taking differently: Inhale 2 puffs into the lungs every 6 (six) hours as needed for wheezing or shortness of breath.), Disp: 8.5 g, Rfl: 1   amLODipine (NORVASC) 2.5 MG tablet, Take 1 tablet (2.5 mg total) by mouth daily., Disp: 90  tablet, Rfl: 3   aspirin EC 81 MG EC tablet, Take 1 tablet (81 mg total) by mouth daily. Swallow whole., Disp: 90 tablet, Rfl: 2   atorvastatin (LIPITOR) 10 MG tablet, Take 1 tablet (10 mg total) by mouth daily., Disp: 90 tablet, Rfl: 3   cholecalciferol (VITAMIN D) 25 MCG (1000 UNIT) tablet, Take 1,000 Units by mouth every morning., Disp: , Rfl:    Cyanocobalamin (B-12) 1000 MCG TABS, Take 1,000 mcg by mouth every morning., Disp: , Rfl:    FOLIC ACID PO, Take 1 tablet by mouth every morning., Disp: , Rfl:    levocetirizine (XYZAL) 5 MG tablet, Take 1 tablet (5 mg total) by mouth every evening., Disp: 30 tablet, Rfl: 5   LORazepam (ATIVAN) 0.5 MG tablet, Take 0.5-1 tablets (0.25-0.5 mg total) by mouth every 8 (eight) hours as needed for anxiety., Disp: 10 tablet, Rfl: 0   meloxicam (MOBIC) 15 MG tablet, Take 1 tablet (15 mg total) by mouth daily., Disp: 30 tablet, Rfl: 0   metoprolol succinate (TOPROL-XL) 100 MG 24 hr tablet, Take 1 tablet (100 mg total) by mouth daily after supper., Disp: 90 tablet, Rfl: 1   omeprazole (PRILOSEC OTC) 20 MG tablet, Take 20 mg by mouth daily as needed (acid reflux/heartburn)., Disp: , Rfl:    SYNTHROID 75 MCG tablet, Take 1 tablet (75 mcg total) by mouth daily before breakfast., Disp: 90 tablet, Rfl: 1  Objective:     Vitals:   09/08/22 1316  BP: 122/72  Pulse: 60  SpO2: 99%  Weight: 131 lb (59.4 kg)  Height: 5\' 7"  (1.702 m)      Body mass index is 20.52 kg/m.    Physical Exam:    Gen: Appears well, nad, nontoxic and pleasant Neuro:sensation intact, strength is 5/5 with df/pf/inv/ev, muscle tone wnl Skin: no suspicious lesion or defmority Psych: A&O, appropriate mood and affect  Shoulder: no deformity, swelling or muscle wasting No scapular winging FF 180, abd 180, int 0, ext 90 NTTP over the [place], clavicle, ac, coracoid, biceps groove, humerus, deltoid, trapezius, cervical spine Negative Spurling's test bilat FROM of neck    Electronically  signed by:  Tracey Giles D.Tracey Giles [facility] 1:34 PM 09/08/22

## 2022-09-08 ENCOUNTER — Ambulatory Visit: Payer: Medicare PPO | Admitting: Sports Medicine

## 2022-09-08 VITALS — BP 122/72 | HR 60 | Ht 67.0 in | Wt 131.0 lb

## 2022-09-08 DIAGNOSIS — M7551 Bursitis of right shoulder: Secondary | ICD-10-CM

## 2022-09-08 DIAGNOSIS — M25511 Pain in right shoulder: Secondary | ICD-10-CM

## 2022-09-08 NOTE — Patient Instructions (Signed)
Good to see you   

## 2022-10-15 ENCOUNTER — Ambulatory Visit: Payer: Medicare PPO | Attending: Cardiology | Admitting: Cardiology

## 2022-10-15 ENCOUNTER — Encounter: Payer: Self-pay | Admitting: Cardiology

## 2022-10-15 VITALS — BP 124/70 | HR 53 | Ht 68.0 in | Wt 132.6 lb

## 2022-10-15 DIAGNOSIS — R002 Palpitations: Secondary | ICD-10-CM

## 2022-10-15 DIAGNOSIS — I201 Angina pectoris with documented spasm: Secondary | ICD-10-CM

## 2022-10-15 NOTE — Progress Notes (Signed)
Cardiology Office Note  Date: 10/15/2022   ID: Tracey Giles, DOB Aug 30, 1951, MRN [id number]  PCP:  Karie Georges, MD  Cardiologist:  Nona Dell, MD Electrophysiologist:  None   Chief Complaint  Patient presents with   Cardiac follow-up    History of Present Illness: Tracey Giles is a 71 y.o. female last seen in April by Ms. Strader PA-C, I reviewed the note.  She is here for a routine visit.  Reports no chest pain or dyspnea on exertion, no palpitations or syncope.  I reviewed her medications which are outlined below.  She reports no current intolerances, doing well on low-dose Lipitor with most recent LDL 55.  We did discuss her regular walking plan for exercise.  Cardiac catheterization from January showed angiographically normal coronary arteries with only minimal luminal irregularities.  There was concern about possible coronary vasospasm leading to that presentation.  Past Medical History:  Diagnosis Date   Anxiety    Asthma    exercise induced asthma    Essential hypertension    GERD (gastroesophageal reflux disease)    Heart murmur    mitral valve prolapse   History of dermatitis    History of migraine headaches yrs ago, none recent   Hyperlipemia 05/24/2016   Hypothyroidism    Meniere's disease    NSTEMI (non-ST elevated myocardial infarction) (HCC)    a. s/p NSTEMI in 12/2021 with cath showing normal cors and felt to possibly be secondary to spasm.   Palpitations    Rare PVCs by Holter monitor April 2011    Sleep apnea    Reportedly mild, no cpap needed   Tingling    both legs saw dr Epimenio Foot 04-23-2020 no cause found   Vitamin D insufficiency 05/24/2016    Past Surgical History:  Procedure Laterality Date   APPENDECTOMY  as child   BREAST LUMPECTOMY  yrs ago   Benign   DILATATION & CURETTAGE/HYSTEROSCOPY WITH MYOSURE N/A 05/07/2020   Procedure: DILATATION & CURETTAGE/HYSTEROSCOPY WITH MYOSURE/POLYP REMOVAL;  Surgeon: Romualdo Bolk, MD;  Location: [facility];  Service: Gynecology;  Laterality: N/A;  polyp removal   LEFT HEART CATH AND CORONARY ANGIOGRAPHY N/A 12/12/2021   Procedure: LEFT HEART CATH AND CORONARY ANGIOGRAPHY;  Surgeon: Tonny Bollman, MD;  Location: [facility] IN[facility]ASIVE CV [facility];  Service: Cardiovascular;  Laterality: N/A;    Current Outpatient Medications  Medication Sig Dispense Refill   acetaminophen (TYLENOL) 500 MG tablet Take 1,000 mg by mouth every 6 (six) hours as needed (pain).     albuterol (VENTOLIN HFA) 108 (90 Base) MCG/ACT inhaler Inhale 2 puffs into the lungs every 6 (six) hours as needed. (Patient taking differently: Inhale 2 puffs into the lungs every 6 (six) hours as needed for wheezing or shortness of breath.) 8.5 g 1   amLODipine (NORVASC) 2.5 MG tablet Take 1 tablet (2.5 mg total) by mouth daily. 90 tablet 3   aspirin EC 81 MG EC tablet Take 1 tablet (81 mg total) by mouth daily. Swallow whole. 90 tablet 2   atorvastatin (LIPITOR) 10 MG tablet Take 1 tablet (10 mg total) by mouth daily. 90 tablet 3   cholecalciferol (VITAMIN D) 25 MCG (1000 UNIT) tablet Take 1,000 Units by mouth every morning.     Cyanocobalamin (B-12) 1000 MCG TABS Take 1,000 mcg by mouth every morning.     FOLIC ACID PO Take 1 tablet by mouth every morning.     levocetirizine (XYZAL) 5 MG tablet Take  1 tablet (5 mg total) by mouth every evening. 30 tablet 5   LORazepam (ATIVAN) 0.5 MG tablet Take 0.5-1 tablets (0.25-0.5 mg total) by mouth every 8 (eight) hours as needed for anxiety. 10 tablet 0   meloxicam (MOBIC) 15 MG tablet Take 1 tablet (15 mg total) by mouth daily. 30 tablet 0   metoprolol succinate (TOPROL-XL) 100 MG 24 hr tablet Take 1 tablet (100 mg total) by mouth daily after supper. 90 tablet 1   omeprazole (PRILOSEC OTC) 20 MG tablet Take 20 mg by mouth daily as needed (acid reflux/heartburn).     SYNTHROID 75 MCG tablet Take 1 tablet (75 mcg total) by mouth daily before breakfast. 90 tablet 1    No current facility-administered medications for this visit.   Allergies:  Tums [calcium carbonate antacid], Cephalexin, Penicillins, and Sulfamethoxazole-trimethoprim   ROS: No orthopnea or PND.  No syncope.  Physical Exam: VS:  BP 124/70   Pulse (!) 53   Ht 5\' 8"  (1.727 m)   Wt 132 lb 9.6 oz (60.1 kg)   SpO2 99%   BMI 20.16 kg/m , BMI Body mass index is 20.16 kg/m.  Wt Readings from Last 3 Encounters:  10/15/22 132 lb 9.6 oz (60.1 kg)  09/08/22 131 lb (59.4 kg)  08/21/22 132 lb 6.4 oz (60.1 kg)    General: Patient appears comfortable at rest. HEENT: Conjunctiva and lids normal. Neck: Supple, no elevated JVP or carotid bruits. Lungs: Clear to auscultation, nonlabored breathing at rest. Cardiac: Regular rate and rhythm, no S3 or significant systolic murmur. Extremities: No pitting edema.  ECG:  An ECG dated 12/12/2021 was personally reviewed today and demonstrated:  Sinus rhythm with nonspecific ST segment changes.  Recent Labwork: 03/03/2022: ALT 8; AST 17; BUN 15; Creatinine, Ser 0.88; Hemoglobin 13.2; Platelets 265.0; Potassium 5.0; Sodium 140; TSH 0.79     Component Value Date/Time   CHOL 135 07/03/2022 0840   TRIG 98 07/03/2022 0840   HDL 60 07/03/2022 0840   CHOLHDL 2.3 07/03/2022 0840   VLDL 20 07/03/2022 0840   LDLCALC 55 07/03/2022 0840    Other Studies Reviewed Today:  Echocardiogram 01/06/2019:  1. The left ventricle has hyperdynamic systolic function of >65%. The  cavity size is normal. There is mild focal basal septal left ventricular  wall thickness. Echo evidence of impaired relaxation diastolic filling  patterns. Normal left ventricular  filling pressures.   2. Normal left atrial size.   3. Normal right atrial size.   4. Normal tricuspid valve.   5. No atrial level shunt detected by color flow Doppler.   Cardiac catheterization 12/12/2021:   There is hyperdynamic left ventricular systolic function.   LV end diastolic pressure is normal.   The  left ventricular ejection fraction is greater than 65% by visual estimate.   1.  Widely patent coronary arteries with mild irregularity of the proximal LAD, widely patent left main, widely patent left circumflex with minimal irregularity, and angiographically normal RCA 2.  Hyperdynamic LV function with LVEF greater than 65%, no regional wall motion abnormalities 3.  Normal LVEDP  Assessment and Plan:  1.  History of NSTEMI in January of this year felt to be potentially related to coronary vasospasm with cardiac catheterization showing angiographically normal coronaries other than minor luminal irregularities.  Plan is to continue medical therapy, she is clinically stable at this point.  Continue aspirin, Norvasc, Lipitor, and Toprol-XL.  Also discussed walking plan.  Her most recent LDL was 55.  2.  Palpitations, currently quiescent.  Previous cardiac monitoring showed PACs and PVCs.  She is on Toprol-XL.  Medication Adjustments/Labs and Tests Ordered: Current medicines are reviewed at length with the patient today.  Concerns regarding medicines are outlined above.   Tests Ordered: No orders of the defined types were placed in this encounter.   Medication Changes: No orders of the defined types were placed in this encounter.   Disposition:  Follow up  6 months.  Signed, Jonelle Sidle, MD, [facility] 10/15/2022 2:54 PM    [facility] [address], [place], [place] [address] Phone: [phone]; Fax: [phone]

## 2022-10-15 NOTE — Patient Instructions (Signed)
Medication Instructions:  Your physician recommends that you continue on your current medications as directed. Please refer to the Current Medication list given to you today.   Labwork: None today  Testing/Procedures: None today  Follow-Up: 6 months  Any Other Special Instructions Will Be Listed Below (If Applicable).  If you need a refill on your cardiac medications before your next appointment, please call your pharmacy.  

## 2022-10-23 DIAGNOSIS — H6123 Impacted cerumen, bilateral: Secondary | ICD-10-CM | POA: Diagnosis not present

## 2022-10-23 DIAGNOSIS — H90A22 Sensorineural hearing loss, unilateral, left ear, with restricted hearing on the contralateral side: Secondary | ICD-10-CM | POA: Diagnosis not present

## 2022-10-23 DIAGNOSIS — H9313 Tinnitus, bilateral: Secondary | ICD-10-CM | POA: Diagnosis not present

## 2022-10-23 DIAGNOSIS — H8102 Meniere's disease, left ear: Secondary | ICD-10-CM | POA: Diagnosis not present

## 2022-10-23 DIAGNOSIS — Z974 Presence of external hearing-aid: Secondary | ICD-10-CM | POA: Diagnosis not present

## 2022-11-11 ENCOUNTER — Other Ambulatory Visit: Payer: Self-pay | Admitting: *Deleted

## 2022-11-11 MED ORDER — METOPROLOL SUCCINATE ER 100 MG PO TB24: 100.0000 mg | ORAL_TABLET | Freq: Every day | ORAL | 1 refills | Status: DC

## 2022-11-11 MED ORDER — SYNTHROID 75 MCG PO TABS
75.0000 ug | ORAL_TABLET | Freq: Every day | ORAL | 1 refills | Status: DC
Start: 2022-11-11 — End: 2023-05-10

## 2022-11-16 DIAGNOSIS — H00014 Hordeolum externum left upper eyelid: Secondary | ICD-10-CM | POA: Diagnosis not present

## 2022-11-16 DIAGNOSIS — H0100A Unspecified blepharitis right eye, upper and lower eyelids: Secondary | ICD-10-CM | POA: Diagnosis not present

## 2022-11-16 DIAGNOSIS — H0100B Unspecified blepharitis left eye, upper and lower eyelids: Secondary | ICD-10-CM | POA: Diagnosis not present

## 2022-12-01 ENCOUNTER — Ambulatory Visit: Payer: Medicare PPO

## 2022-12-02 ENCOUNTER — Ambulatory Visit: Payer: Medicare PPO

## 2022-12-11 ENCOUNTER — Ambulatory Visit (INDEPENDENT_AMBULATORY_CARE_PROVIDER_SITE_OTHER): Payer: Medicare PPO

## 2022-12-11 VITALS — BP 122/62 | HR 60 | Temp 97.9°F | Ht 68.0 in | Wt 133.7 lb

## 2022-12-11 DIAGNOSIS — Z0000 Encounter for general adult medical examination without abnormal findings: Secondary | ICD-10-CM

## 2022-12-11 NOTE — Patient Instructions (Addendum)
Tracey Giles , Thank you for taking time to come for your Medicare Wellness Visit. I appreciate your ongoing commitment to your health goals. Please review the following plan we discussed and let me know if I can assist you in the future.   These are the goals we discussed:  Goals       No current goals (pt-stated)      Stay Healthy.      Patient Stated      Continue to keep good skills for mental health and voluteer       Patient Stated      Get back to exercising       Patient Stated      11/25/2021, exercise more        This is a list of the screening recommended for you and due dates:  Health Maintenance  Topic Date Due   COVID-19 Vaccine (6 - 2023-24 season) 12/27/2022*   Zoster (Shingles) Vaccine (1 of 2) 03/12/2023*   Mammogram  01/27/2023   DEXA scan (bone density measurement)  01/27/2023   Medicare Annual Wellness Visit  12/12/2023   Cologuard (Stool DNA test)  12/12/2023   DTaP/Tdap/Td vaccine (4 - Tdap) 10/01/2027   Pneumonia Vaccine  Completed   Flu Shot  Completed   Hepatitis C Screening: USPSTF Recommendation to screen - Ages 18-79 yo.  Addressed   HPV Vaccine  Aged Out  *Topic was postponed. The date shown is not the original due date.    Advanced directives: Advance directive discussed with you today. Even though you declined this today, please call our office should you change your mind, and we can give you the proper paperwork for you to fill out.   Conditions/risks identified: None  Next appointment: Follow up in one year for your annual wellness visit     Preventive Care 65 Years and Older, Female Preventive care refers to lifestyle choices and visits with your health care provider that can promote health and wellness. What does preventive care include? A yearly physical exam. This is also called an annual well check. Dental exams once or twice a year. Routine eye exams. Ask your health care provider how often you should have your eyes  checked. Personal lifestyle choices, including: Daily care of your teeth and gums. Regular physical activity. Eating a healthy diet. Avoiding tobacco and drug use. Limiting alcohol use. Practicing safe sex. Taking low-dose aspirin every day. Taking vitamin and mineral supplements as recommended by your health care provider. What happens during an annual well check? The services and screenings done by your health care provider during your annual well check will depend on your age, overall health, lifestyle risk factors, and family history of disease. Counseling  Your health care provider may ask you questions about your: Alcohol use. Tobacco use. Drug use. Emotional well-being. Home and relationship well-being. Sexual activity. Eating habits. History of falls. Memory and ability to understand (cognition). Work and work Statistician. Reproductive health. Screening  You may have the following tests or measurements: Height, weight, and BMI. Blood pressure. Lipid and cholesterol levels. These may be checked every 5 years, or more frequently if you are over 78 years old. Skin check. Lung cancer screening. You may have this screening every year starting at age 13 if you have a 30-pack-year history of smoking and currently smoke or have quit within the past 15 years. Fecal occult blood test (FOBT) of the stool. You may have this test every year starting at age 60.  Flexible sigmoidoscopy or colonoscopy. You may have a sigmoidoscopy every 5 years or a colonoscopy every 10 years starting at age 40. Hepatitis C blood test. Hepatitis B blood test. Sexually transmitted disease (STD) testing. Diabetes screening. This is done by checking your blood sugar (glucose) after you have not eaten for a while (fasting). You may have this done every 1-3 years. Bone density scan. This is done to screen for osteoporosis. You may have this done starting at age 70. Mammogram. This may be done every 1-2  years. Talk to your health care provider about how often you should have regular mammograms. Talk with your health care provider about your test results, treatment options, and if necessary, the need for more tests. Vaccines  Your health care provider may recommend certain vaccines, such as: Influenza vaccine. This is recommended every year. Tetanus, diphtheria, and acellular pertussis (Tdap, Td) vaccine. You may need a Td booster every 10 years. Zoster vaccine. You may need this after age 30. Pneumococcal 13-valent conjugate (PCV13) vaccine. One dose is recommended after age 37. Pneumococcal polysaccharide (PPSV23) vaccine. One dose is recommended after age 69. Talk to your health care provider about which screenings and vaccines you need and how often you need them. This information is not intended to replace advice given to you by your health care provider. Make sure you discuss any questions you have with your health care provider. Document Released: 12/20/2015 Document Revised: 08/12/2016 Document Reviewed: 09/24/2015 Elsevier Interactive Patient Education  2017 [place] Prevention in the Home Falls can cause injuries. They can happen to people of all ages. There are many things you can do to make your home safe and to help prevent falls. What can I do on the outside of my home? Regularly fix the edges of walkways and driveways and fix any cracks. Remove anything that might make you trip as you walk through a door, such as a raised step or threshold. Trim any bushes or trees on the path to your home. Use bright outdoor lighting. Clear any walking paths of anything that might make someone trip, such as rocks or tools. Regularly check to see if handrails are loose or broken. Make sure that both sides of any steps have handrails. Any raised decks and porches should have guardrails on the edges. Have any leaves, snow, or ice cleared regularly. Use sand or salt on walking paths  during winter. Clean up any spills in your garage right away. This includes oil or grease spills. What can I do in the bathroom? Use night lights. Install grab bars by the toilet and in the tub and shower. Do not use towel bars as grab bars. Use non-skid mats or decals in the tub or shower. If you need to sit down in the shower, use a plastic, non-slip stool. Keep the floor dry. Clean up any water that spills on the floor as soon as it happens. Remove soap buildup in the tub or shower regularly. Attach bath mats securely with double-sided non-slip rug tape. Do not have throw rugs and other things on the floor that can make you trip. What can I do in the bedroom? Use night lights. Make sure that you have a light by your bed that is easy to reach. Do not use any sheets or blankets that are too big for your bed. They should not hang down onto the floor. Have a firm chair that has side arms. You can use this for support while you get  dressed. Do not have throw rugs and other things on the floor that can make you trip. What can I do in the kitchen? Clean up any spills right away. Avoid walking on wet floors. Keep items that you use a lot in easy-to-reach places. If you need to reach something above you, use a strong step stool that has a grab bar. Keep electrical cords out of the way. Do not use floor polish or wax that makes floors slippery. If you must use wax, use non-skid floor wax. Do not have throw rugs and other things on the floor that can make you trip. What can I do with my stairs? Do not leave any items on the stairs. Make sure that there are handrails on both sides of the stairs and use them. Fix handrails that are broken or loose. Make sure that handrails are as long as the stairways. Check any carpeting to make sure that it is firmly attached to the stairs. Fix any carpet that is loose or worn. Avoid having throw rugs at the top or bottom of the stairs. If you do have throw  rugs, attach them to the floor with carpet tape. Make sure that you have a light switch at the top of the stairs and the bottom of the stairs. If you do not have them, ask someone to add them for you. What else can I do to help prevent falls? Wear shoes that: Do not have high heels. Have rubber bottoms. Are comfortable and fit you well. Are closed at the toe. Do not wear sandals. If you use a stepladder: Make sure that it is fully opened. Do not climb a closed stepladder. Make sure that both sides of the stepladder are locked into place. Ask someone to hold it for you, if possible. Clearly mark and make sure that you can see: Any grab bars or handrails. First and last steps. Where the edge of each step is. Use tools that help you move around (mobility aids) if they are needed. These include: Canes. Walkers. Scooters. Crutches. Turn on the lights when you go into a dark area. Replace any light bulbs as soon as they burn out. Set up your furniture so you have a clear path. Avoid moving your furniture around. If any of your floors are uneven, fix them. If there are any pets around you, be aware of where they are. Review your medicines with your doctor. Some medicines can make you feel dizzy. This can increase your chance of falling. Ask your doctor what other things that you can do to help prevent falls. This information is not intended to replace advice given to you by your health care provider. Make sure you discuss any questions you have with your health care provider. Document Released: 09/19/2009 Document Revised: 04/30/2016 Document Reviewed: 12/28/2014 Elsevier Interactive Patient Education  2017 [employer].

## 2022-12-11 NOTE — Progress Notes (Signed)
Subjective:   Tracey Giles is a 72 y.o. female who presents for Medicare Annual (Subsequent) preventive examination.  Review of Systems      Cardiac Risk Factors include: advanced age (>54men, >3 women);hypertension     Objective:    Today's Vitals   12/11/22 1514  BP: 122/62  Pulse: 60  Temp: 97.9 F (36.6 C)  TempSrc: Oral  SpO2: 98%  Weight: 133 lb 11.2 oz (60.6 kg)  Height: 5\' 8"  (1.727 m)   Body mass index is 20.33 kg/m.     12/11/2022    3:28 PM 07/02/2022    1:39 PM 12/12/2021    4:18 PM 11/25/2021   12:09 PM 11/19/2020   11:09 AM 05/07/2020    9:38 AM 10/04/2018    7:39 AM  Advanced Directives  Does Patient Have a Medical Advance Directive? No No No No No No No  Would patient like information on creating a medical advance directive? No - Patient declined No - Patient declined No - Patient declined No - Patient declined No - Patient declined No - Patient declined     Current Medications (verified) Outpatient Encounter Medications as of 12/11/2022  Medication Sig   acetaminophen (TYLENOL) 500 MG tablet Take 1,000 mg by mouth every 6 (six) hours as needed (pain).   albuterol (VENTOLIN HFA) 108 (90 Base) MCG/ACT inhaler Inhale 2 puffs into the lungs every 6 (six) hours as needed. (Patient taking differently: Inhale 2 puffs into the lungs every 6 (six) hours as needed for wheezing or shortness of breath.)   amLODipine (NORVASC) 2.5 MG tablet Take 1 tablet (2.5 mg total) by mouth daily.   aspirin EC 81 MG EC tablet Take 1 tablet (81 mg total) by mouth daily. Swallow whole.   atorvastatin (LIPITOR) 10 MG tablet Take 1 tablet (10 mg total) by mouth daily.   cholecalciferol (VITAMIN D) 25 MCG (1000 UNIT) tablet Take 1,000 Units by mouth every morning.   Cyanocobalamin (B-12) 1000 MCG TABS Take 1,000 mcg by mouth every morning.   FOLIC ACID PO Take 1 tablet by mouth every morning.   levocetirizine (XYZAL) 5 MG tablet Take 1 tablet (5 mg total) by mouth every  evening.   LORazepam (ATIVAN) 0.5 MG tablet Take 0.5-1 tablets (0.25-0.5 mg total) by mouth every 8 (eight) hours as needed for anxiety.   meloxicam (MOBIC) 15 MG tablet Take 1 tablet (15 mg total) by mouth daily.   metoprolol succinate (TOPROL-XL) 100 MG 24 hr tablet Take 1 tablet (100 mg total) by mouth daily after supper.   omeprazole (PRILOSEC OTC) 20 MG tablet Take 20 mg by mouth daily as needed (acid reflux/heartburn).   SYNTHROID 75 MCG tablet Take 1 tablet (75 mcg total) by mouth daily before breakfast.   No facility-administered encounter medications on file as of 12/11/2022.    Allergies (verified) Tums [calcium carbonate antacid], Cephalexin, Penicillins, and Sulfamethoxazole-trimethoprim   History: Past Medical History:  Diagnosis Date   Anxiety    Asthma    exercise induced asthma    Essential hypertension    GERD (gastroesophageal reflux disease)    Heart murmur    mitral valve prolapse   History of dermatitis    History of migraine headaches yrs ago, none recent   Hyperlipemia 05/24/2016   Hypothyroidism    Meniere's disease    NSTEMI (non-ST elevated myocardial infarction) ([place])    a. s/p NSTEMI in 12/2021 with cath showing normal cors and felt to possibly be secondary to  spasm.   Palpitations    Rare PVCs by Holter monitor April 2011    Sleep apnea    Reportedly mild, no cpap needed   Tingling    both legs saw dr Felecia Shelling 04-23-2020 no cause found   Vitamin D insufficiency 05/24/2016   Past Surgical History:  Procedure Laterality Date   APPENDECTOMY  as child   BREAST LUMPECTOMY  yrs ago   [place] N/A 05/07/2020   Procedure: DILATATION & CURETTAGE/HYSTEROSCOPY WITH MYOSURE/POLYP REMOVAL;  Surgeon: Salvadore Dom, MD;  Location: [facility];  Service: Gynecology;  Laterality: N/A;  polyp removal   LEFT HEART CATH AND CORONARY ANGIOGRAPHY N/A 12/12/2021   Procedure: LEFT HEART CATH AND CORONARY  ANGIOGRAPHY;  Surgeon: Sherren Mocha, MD;  Location: [facility];  Service: Cardiovascular;  Laterality: N/A;   Family History  Problem Relation Age of Onset   Melanoma Father 36   Stroke Mother    Asthma Mother    Stroke Maternal Grandfather    Asthma Brother    Deep vein thrombosis Brother 4       had back surgery with post op complications of blood clots which were fatal   Thyroid disease Neg Hx    Social History   Socioeconomic History   Marital status: Married    Spouse name: Marcello Moores   Number of children: 0   Years of education: 14   Highest education level: Futures trader degree: occupational, Hotel manager, or vocational program  Occupational History   Occupation: Landscape architect: [employer]  Tobacco Use   Smoking status: Never   Smokeless tobacco: Never  Vaping Use   Vaping Use: Never used  Substance and Sexual Activity   Alcohol use: No   Drug use: No   Sexual activity: Not Currently  Other Topics Concern   Not on file  Social History Narrative   Work or School: retired - used to work for [employer] Situation: lives with husband      Spiritual Beliefs: Christian      Lifestyle: no regular exercise; diet is healthy      Right handed    No caffeine use:    Social Determinants of Radio broadcast assistant Strain: Low Risk  (12/11/2022)   Overall Financial Resource Strain (CARDIA)    Difficulty of Paying Living Expenses: Not hard at all  Food Insecurity: No Food Insecurity (12/11/2022)   Hunger Vital Sign    Worried About Running Out of Food in the Last Year: Never true    [place] in the Last Year: Never true  Transportation Needs: No Transportation Needs (12/11/2022)   PRAPARE - Hydrologist (Medical): No    Lack of Transportation (Non-Medical): No  Physical Activity: Insufficiently Active (12/11/2022)   Exercise Vital Sign    Days of Exercise per Week: 3 days    Minutes of Exercise per  Session: 30 min  Stress: No Stress Concern Present (12/11/2022)   [place]    Feeling of Stress : Not at all  Social Connections: [place] (12/11/2022)   Social Connection and Isolation Panel [NHANES]    Frequency of Communication with Friends and Family: More than three times a week    Frequency of Social Gatherings with Friends and Family: More than three times a week  Attends Religious Services: More than 4 times per year    Active Member of Clubs or Organizations: Yes    Attends Archivist Meetings: More than 4 times per year    Marital Status: Married    Tobacco Counseling Counseling given: Not Answered   Clinical Intake:  Pre-visit preparation completed: No  Pain : No/denies pain     BMI - recorded: 20.33 Nutritional Status: BMI of 19-24  Normal Nutritional Risks: None Diabetes: No  How often do you need to have someone help you when you read instructions, pamphlets, or other written materials from your doctor or pharmacy?: 1 - Never  Diabetic?  No     Information entered by :: Rolene Arbour LPN   Activities of Daily Living    12/11/2022    3:26 PM 12/12/2021   11:00 PM  In your present state of health, do you have any difficulty performing the following activities:  Hearing? 1 0  Comment Wears hearing aids   Vision? 0 0  Difficulty concentrating or making decisions? 0 0  Walking or climbing stairs? 0 0  Dressing or bathing? 0 0  Doing errands, shopping? 0 0  Preparing Food and eating ? N   Using the Toilet? N   In the past six months, have you accidently leaked urine? N   Do you have problems with loss of bowel control? N   Managing your Medications? N   Managing your Finances? N   Housekeeping or managing your Housekeeping? N     Patient Care Team: Farrel Conners, MD as PCP - General (Family Medicine) Satira Sark, MD as PCP - Cardiology  (Cardiology) Vicie Mutters, MD as Consulting Physician (Otolaryngology) Druscilla Brownie, MD as Consulting Physician (Dermatology) Salvadore Dom, MD as Consulting Physician (Obstetrics and Gynecology)  Indicate any recent Medical Services you may have received from other than [facility] providers in the past year (date may be approximate).     Assessment:   This is a routine wellness examination for Aerin.  Hearing/Vision screen Hearing Screening - Comments:: Wears hearing aids Vision Screening - Comments:: Wears rx glasses - up to date with routine eye exams with  Coralie Keens  Dietary issues and exercise activities discussed: Current Exercise Habits: Home exercise routine, Type of exercise: walking, Time (Minutes): 30, Frequency (Times/Week): 3, Weekly Exercise (Minutes/Week): 90, Intensity: Moderate, Exercise limited by: None identified   Goals Addressed               This Visit's Progress     No current goals (pt-stated)        Stay Healthy.       Depression Screen    12/11/2022    3:14 PM 08/21/2022   11:22 AM 12/03/2021    2:17 PM 11/25/2021   12:10 PM 11/21/2021   10:51 AM 11/19/2020   11:08 AM 10/10/2019    9:20 AM  PHQ 2/9 Scores  PHQ - 2 Score 0 0 0 0 0 0 0  PHQ- 9 Score  0 0  1      Fall Risk    12/11/2022    3:27 PM 12/07/2022    9:39 AM 12/01/2022    3:36 AM 11/25/2021   12:09 PM 11/19/2021    8:50 AM  [place] in the past year? 0 0 0 0 0  Number falls in past yr: 0      Injury with Fall? 0      Risk  for fall due to : No Fall Risks   Medication side effect   Follow up Falls prevention discussed   Falls evaluation completed;Education provided;Falls prevention discussed     FALL RISK PREVENTION PERTAINING TO THE HOME:  Any stairs in or around the home? Yes  If so, are there any without handrails? No  Home free of loose throw rugs in walkways, pet beds, electrical cords, etc? Yes  Adequate lighting in your home to reduce risk of  falls? Yes   ASSISTIVE DEVICES UTILIZED TO PREVENT FALLS:  Life alert? No  Use of a cane, walker or w/c? No  Grab bars in the bathroom? Yes  Shower chair or bench in shower? Yes  Elevated toilet seat or a handicapped toilet? Yes   TIMED UP AND GO:  Was the test performed? Yes .  Length of time to ambulate 10 feet: 10 sec.   Gait steady and fast without use of assistive device  Cognitive Function:        12/11/2022    3:28 PM 11/25/2021   12:10 PM 11/19/2020   11:12 AM  6CIT Screen  What Year? 0 points 0 points 0 points  What month? 0 points 0 points 0 points  What time? 0 points 0 points   Count back from 20 0 points 0 points 0 points  Months in reverse 0 points 0 points 0 points  Repeat phrase 0 points 2 points 0 points  Total Score 0 points 2 points     Immunizations Immunization History  Administered Date(s) Administered   Fluad Quad(high Dose 65+) 10/05/2019, 09/24/2020, 08/21/2022   Influenza Split 09/21/2011, 09/05/2012   Influenza Whole 10/07/2001, 09/23/2007, 09/06/2008, 10/10/2009   Influenza, High Dose Seasonal PF 09/15/2018   Influenza,inj,Quad PF,6+ Mos 10/09/2013, 09/06/2014, 08/14/2016   Influenza-Unspecified 09/14/2017, 09/19/2018, 10/06/2021   Moderna Sars-Covid-2 Vaccination 01/19/2020, 02/16/2020, 10/15/2020   PFIZER Comirnaty(Gray Top)Covid-19 Tri-Sucrose Vaccine 07/22/2021   Pfizer Covid-19 Vaccine Bivalent Booster 6071yrs & up 02/04/2022   Pneumococcal Conjugate-13 12/11/2016   Pneumococcal Polysaccharide-23 12/30/2017   Td 02/04/1998, 09/23/2007, 09/30/2017   Zoster, Live 06/03/2012    TDAP status: Up to date  Flu Vaccine status: Up to date  Pneumococcal vaccine status: Up to date  Covid-19 vaccine status: Completed vaccines  Qualifies for Shingles Vaccine? Yes   Zostavax completed No   Shingrix Completed?: No.    Education has been provided regarding the importance of this vaccine. Patient has been advised to call insurance company to  determine out of pocket expense if they have not yet received this vaccine. Advised may also receive vaccine at local pharmacy or Health Dept. Verbalized acceptance and understanding.  Screening Tests Health Maintenance  Topic Date Due   COVID-19 Vaccine (6 - 2023-24 season) 12/27/2022 (Originally 08/07/2022)   Zoster Vaccines- Shingrix (1 of 2) 03/12/2023 (Originally 08/26/1970)   MAMMOGRAM  01/27/2023   DEXA SCAN  01/27/2023   Medicare Annual Wellness (AWV)  12/12/2023   Fecal DNA (Cologuard)  12/12/2023   DTaP/Tdap/Td (4 - Tdap) 10/01/2027   Pneumonia Vaccine 2265+ Years old  Completed   INFLUENZA VACCINE  Completed   Hepatitis C Screening  Addressed   HPV VACCINES  Aged Out    Health Maintenance  There are no preventive care reminders to display for this patient.   Colorectal cancer screening: Type of screening: FOBT/FIT. Completed 10/12/17. Repeat every 3 years  Mammogram status: Completed 01/27/21. Repeat every year  Bone Density status: Completed 01/27/21. Results reflect: Bone density results: OSTEOPOROSIS.  Repeat every 2 years.  Lung Cancer Screening: (Low Dose CT Chest recommended if Age 56-80 years, 30 pack-year currently smoking OR have quit w/in 15years.) does not qualify.     Additional Screening:  Hepatitis C Screening: does qualify; Completed 12/11/16  Vision Screening: Recommended annual ophthalmology exams for early detection of glaucoma and other disorders of the eye. Is the patient up to date with their annual eye exam?  Yes  Who is the provider or what is the name of the office in which the patient attends annual eye exams? [facility] If pt is not established with a provider, would they like to be referred to a provider to establish care? No .   Dental Screening: Recommended annual dental exams for proper oral hygiene  Community Resource Referral / Chronic Care Management:  CRR required this visit?  No   CCM required this visit?  No      Plan:      I have personally reviewed and noted the following in the patient's chart:   Medical and social history Use of alcohol, tobacco or illicit drugs  Current medications and supplements including opioid prescriptions. Patient is not currently taking opioid prescriptions. Functional ability and status Nutritional status Physical activity Advanced directives List of other physicians Hospitalizations, surgeries, and ER visits in previous 12 months Vitals Screenings to include cognitive, depression, and falls Referrals and appointments  In addition, I have reviewed and discussed with patient certain preventive protocols, quality metrics, and best practice recommendations. A written personalized care plan for preventive services as well as general preventive health recommendations were provided to patient.     Criselda Peaches, LPN   [id number]   Nurse Notes: None

## 2023-01-20 ENCOUNTER — Encounter: Payer: Self-pay | Admitting: Family Medicine

## 2023-01-20 DIAGNOSIS — Z780 Asymptomatic menopausal state: Secondary | ICD-10-CM

## 2023-01-20 NOTE — Telephone Encounter (Signed)
Ok to send [facility] the DEXA scan order under the dx postmenopausal state

## 2023-02-05 ENCOUNTER — Other Ambulatory Visit: Payer: Self-pay | Admitting: Student

## 2023-02-10 DIAGNOSIS — M8588 Other specified disorders of bone density and structure, other site: Secondary | ICD-10-CM | POA: Diagnosis not present

## 2023-02-10 DIAGNOSIS — Z1231 Encounter for screening mammogram for malignant neoplasm of breast: Secondary | ICD-10-CM | POA: Diagnosis not present

## 2023-02-10 DIAGNOSIS — M810 Age-related osteoporosis without current pathological fracture: Secondary | ICD-10-CM | POA: Diagnosis not present

## 2023-02-10 DIAGNOSIS — Z780 Asymptomatic menopausal state: Secondary | ICD-10-CM | POA: Diagnosis not present

## 2023-02-10 LAB — HM MAMMOGRAPHY

## 2023-02-10 LAB — HM DEXA SCAN

## 2023-02-11 ENCOUNTER — Encounter: Payer: Self-pay | Admitting: Family Medicine

## 2023-02-11 DIAGNOSIS — M810 Age-related osteoporosis without current pathological fracture: Secondary | ICD-10-CM

## 2023-02-11 MED ORDER — ALENDRONATE SODIUM 70 MG PO TABS: 70.0000 mg | ORAL_TABLET | ORAL | 1 refills | Status: DC

## 2023-02-11 NOTE — Progress Notes (Signed)
Script sent  

## 2023-02-19 ENCOUNTER — Other Ambulatory Visit: Payer: Medicare PPO

## 2023-02-19 ENCOUNTER — Other Ambulatory Visit (INDEPENDENT_AMBULATORY_CARE_PROVIDER_SITE_OTHER): Payer: Medicare PPO

## 2023-02-19 ENCOUNTER — Other Ambulatory Visit: Payer: Self-pay

## 2023-02-19 DIAGNOSIS — E785 Hyperlipidemia, unspecified: Secondary | ICD-10-CM

## 2023-02-19 DIAGNOSIS — I10 Essential (primary) hypertension: Secondary | ICD-10-CM

## 2023-02-19 DIAGNOSIS — E559 Vitamin D deficiency, unspecified: Secondary | ICD-10-CM | POA: Diagnosis not present

## 2023-02-19 DIAGNOSIS — E039 Hypothyroidism, unspecified: Secondary | ICD-10-CM

## 2023-02-19 DIAGNOSIS — E538 Deficiency of other specified B group vitamins: Secondary | ICD-10-CM | POA: Diagnosis not present

## 2023-02-19 DIAGNOSIS — Z0000 Encounter for general adult medical examination without abnormal findings: Secondary | ICD-10-CM

## 2023-02-19 LAB — CBC WITH DIFFERENTIAL/PLATELET
Basophils Absolute: 0.1 10*3/uL (ref 0.0–0.1)
Basophils Relative: 1.5 % (ref 0.0–3.0)
Eosinophils Absolute: 0.2 10*3/uL (ref 0.0–0.7)
Eosinophils Relative: 5.2 % — ABNORMAL HIGH (ref 0.0–5.0)
HCT: 43 % (ref 36.0–46.0)
Hemoglobin: 14.3 g/dL (ref 12.0–15.0)
Lymphocytes Relative: 28.5 % (ref 12.0–46.0)
Lymphs Abs: 1.3 10*3/uL (ref 0.7–4.0)
MCHC: 33.2 g/dL (ref 30.0–36.0)
MCV: 93.4 fl (ref 78.0–100.0)
Monocytes Absolute: 0.4 10*3/uL (ref 0.1–1.0)
Monocytes Relative: 8 % (ref 3.0–12.0)
Neutro Abs: 2.7 10*3/uL (ref 1.4–7.7)
Neutrophils Relative %: 56.8 % (ref 43.0–77.0)
Platelets: 254 10*3/uL (ref 150.0–400.0)
RBC: 4.6 Mil/uL (ref 3.87–5.11)
RDW: 12.9 % (ref 11.5–15.5)
WBC: 4.7 10*3/uL (ref 4.0–10.5)

## 2023-02-19 LAB — LIPID PANEL
Cholesterol: 124 mg/dL (ref 0–200)
HDL: 56.5 mg/dL (ref >39.00)
LDL Cholesterol: 50 mg/dL (ref 0–99)
NonHDL: 67.28
Total CHOL/HDL Ratio: 2
Triglycerides: 84 mg/dL (ref 0.0–149.0)
VLDL: 16.8 mg/dL (ref 0.0–40.0)

## 2023-02-19 LAB — COMPREHENSIVE METABOLIC PANEL
ALT: 8 U/L (ref 0–35)
AST: 17 U/L (ref 0–37)
Albumin: 4 g/dL (ref 3.5–5.2)
Alkaline Phosphatase: 64 U/L (ref 39–117)
BUN: 15 mg/dL (ref 6–23)
CO2: 26 mEq/L (ref 19–32)
Calcium: 9.4 mg/dL (ref 8.4–10.5)
Chloride: 108 mEq/L (ref 96–112)
Creatinine, Ser: 0.82 mg/dL (ref 0.40–1.20)
GFR: 71.93 mL/min (ref >60.00)
Glucose, Bld: 91 mg/dL (ref 70–99)
Potassium: 4.2 mEq/L (ref 3.5–5.1)
Sodium: 142 mEq/L (ref 135–145)
Total Bilirubin: 0.6 mg/dL (ref 0.2–1.2)
Total Protein: 7.1 g/dL (ref 6.0–8.3)

## 2023-02-19 LAB — TSH: TSH: 1.16 u[IU]/mL (ref 0.35–5.50)

## 2023-02-19 LAB — FOLATE: Folate: 20.1 ng/mL (ref >5.9)

## 2023-02-19 LAB — VITAMIN D 25 HYDROXY (VIT D DEFICIENCY, FRACTURES): VITD: 22.97 ng/mL — ABNORMAL LOW (ref 30.00–100.00)

## 2023-02-19 LAB — VITAMIN B12: Vitamin B-12: 357 pg/mL (ref 211–911)

## 2023-03-24 ENCOUNTER — Encounter: Payer: Self-pay | Admitting: Family Medicine

## 2023-03-24 ENCOUNTER — Ambulatory Visit (INDEPENDENT_AMBULATORY_CARE_PROVIDER_SITE_OTHER): Payer: Medicare PPO | Admitting: Family Medicine

## 2023-03-24 VITALS — BP 122/80 | HR 59 | Temp 97.9°F | Ht 68.0 in | Wt 132.6 lb

## 2023-03-24 DIAGNOSIS — M810 Age-related osteoporosis without current pathological fracture: Secondary | ICD-10-CM | POA: Diagnosis not present

## 2023-03-24 DIAGNOSIS — Z0000 Encounter for general adult medical examination without abnormal findings: Secondary | ICD-10-CM | POA: Diagnosis not present

## 2023-03-24 MED ORDER — ALENDRONATE SODIUM 70 MG PO TABS: 70.0000 mg | ORAL_TABLET | ORAL | 1 refills | Status: DC

## 2023-03-24 NOTE — Patient Instructions (Addendum)
Calcium-- 1200 mg per day  Health Maintenance, Female Adopting a healthy lifestyle and getting preventive care are important in promoting health and wellness. Ask your health care provider about: The right schedule for you to have regular tests and exams. Things you can do on your own to prevent diseases and keep yourself healthy. What should I know about diet, weight, and exercise? Eat a healthy diet  Eat a diet that includes plenty of vegetables, fruits, low-fat dairy products, and lean protein. Do not eat a lot of foods that are high in solid fats, added sugars, or sodium. Maintain a healthy weight Body mass index (BMI) is used to identify weight problems. It estimates body fat based on height and weight. Your health care provider can help determine your BMI and help you achieve or maintain a healthy weight. Get regular exercise Get regular exercise. This is one of the most important things you can do for your health. Most adults should: Exercise for at least 150 minutes each week. The exercise should increase your heart rate and make you sweat (moderate-intensity exercise). Do strengthening exercises at least twice a week. This is in addition to the moderate-intensity exercise. Spend less time sitting. Even light physical activity can be beneficial. Watch cholesterol and blood lipids Have your blood tested for lipids and cholesterol at 72 years of age, then have this test every 5 years. Have your cholesterol levels checked more often if: Your lipid or cholesterol levels are high. You are older than 72 years of age. You are at high risk for heart disease. What should I know about cancer screening? Depending on your health history and family history, you may need to have cancer screening at various ages. This may include screening for: Breast cancer. Cervical cancer. Colorectal cancer. Skin cancer. Lung cancer. What should I know about heart disease, diabetes, and high blood  pressure? Blood pressure and heart disease High blood pressure causes heart disease and increases the risk of stroke. This is more likely to develop in people who have high blood pressure readings or are overweight. Have your blood pressure checked: Every 3-5 years if you are 58-45 years of age. Every year if you are 13 years old or older. Diabetes Have regular diabetes screenings. This checks your fasting blood sugar level. Have the screening done: Once every three years after age 27 if you are at a normal weight and have a low risk for diabetes. More often and at a younger age if you are overweight or have a high risk for diabetes. What should I know about preventing infection? Hepatitis B If you have a higher risk for hepatitis B, you should be screened for this virus. Talk with your health care provider to find out if you are at risk for hepatitis B infection. Hepatitis C Testing is recommended for: Everyone born from [birth year] through 1965. Anyone with known risk factors for hepatitis C. Sexually transmitted infections (STIs) Get screened for STIs, including gonorrhea and chlamydia, if: You are sexually active and are younger than 72 years of age. You are older than 72 years of age and your health care provider tells you that you are at risk for this type of infection. Your sexual activity has changed since you were last screened, and you are at increased risk for chlamydia or gonorrhea. Ask your health care provider if you are at risk. Ask your health care provider about whether you are at high risk for HIV. Your health care provider may recommend a  prescription medicine to help prevent HIV infection. If you choose to take medicine to prevent HIV, you should first get tested for HIV. You should then be tested every 3 months for as long as you are taking the medicine. Pregnancy If you are about to stop having your period (premenopausal) and you may become pregnant, seek counseling before you  get pregnant. Take 400 to 800 micrograms (mcg) of folic acid every day if you become pregnant. Ask for birth control (contraception) if you want to prevent pregnancy. Osteoporosis and menopause Osteoporosis is a disease in which the bones lose minerals and strength with aging. This can result in bone fractures. If you are 36 years old or older, or if you are at risk for osteoporosis and fractures, ask your health care provider if you should: Be screened for bone loss. Take a calcium or vitamin D supplement to lower your risk of fractures. Be given hormone replacement therapy (HRT) to treat symptoms of menopause. Follow these instructions at home: Alcohol use Do not drink alcohol if: Your health care provider tells you not to drink. You are pregnant, may be pregnant, or are planning to become pregnant. If you drink alcohol: Limit how much you have to: 0-1 drink a day. Know how much alcohol is in your drink. In the [place] one drink equals one 12 oz bottle of beer (355 mL), one 5 oz glass of wine (148 mL), or one 1 oz glass of hard liquor (44 mL). Lifestyle Do not use any products that contain nicotine or tobacco. These products include cigarettes, chewing tobacco, and vaping devices, such as e-cigarettes. If you need help quitting, ask your health care provider. Do not use street drugs. Do not share needles. Ask your health care provider for help if you need support or information about quitting drugs. General instructions Schedule regular health, dental, and eye exams. Stay current with your vaccines. Tell your health care provider if: You often feel depressed. You have ever been abused or do not feel safe at home. Summary Adopting a healthy lifestyle and getting preventive care are important in promoting health and wellness. Follow your health care provider's instructions about healthy diet, exercising, and getting tested or screened for diseases. Follow your health care provider's  instructions on monitoring your cholesterol and blood pressure. This information is not intended to replace advice given to you by your health care provider. Make sure you discuss any questions you have with your health care provider. Document Revised: 04/14/2021 Document Reviewed: 04/14/2021 Elsevier Patient Education  [place].

## 2023-03-24 NOTE — Progress Notes (Signed)
Complete physical exam  Patient: Tracey Giles   DOB: [birth year]   72 y.o. Female  MRN: [id number]  Subjective:    Chief Complaint  Patient presents with   Annual Exam    Tracey Giles is a 72 y.o. female who presents today for a complete physical exam. She reports consuming a general diet. Home exercise routine includes walking 1 hrs per week. Also has an exercise bike. She generally feels well. She reports sleeping poorly, states she is getting about 6 hours but it is broken sleep. States she is waking up to urinate about 2-4 times every night. She does not have additional problems to discuss today.    Most recent fall risk assessment:    03/24/2023   10:23 AM  Fall Risk   Falls in the past year? 0  Number falls in past yr: 0  Injury with Fall? 0  Risk for fall due to : No Fall Risks  Follow up Falls evaluation completed     Most recent depression screenings:    03/24/2023   10:24 AM 12/11/2022    3:14 PM  PHQ 2/9 Scores  PHQ - 2 Score 0 0    Dental: No current dental problems and Receives regular dental care  Patient Active Problem List   Diagnosis Date Noted   Intestinal gas excretion 08/27/2022   Essential hypertension    Non-ST elevation (NSTEMI) myocardial infarction 12/14/2021   Chest pain at rest 12/12/2021   ACS (acute coronary syndrome)    Abnormal reflex 10/18/2019   Numbness 10/18/2019   B12 deficiency 10/18/2019   Osteoporosis 01/04/2017   Exercise-induced asthma 11/20/2016   Upper airway cough syndrome 07/01/2016   Dyspnea 07/01/2016   Vitamin D insufficiency 05/24/2016   Hyperlipidemia 05/24/2016   Palpitations 11/15/2014   Meniere's disease 07/21/2007   GASTROESOPHAGEAL REFLUX DISEASE, SEVERE 07/21/2007   Hypothyroidism 07/08/2007   Allergic rhinitis 07/08/2007      Patient Care Team: Karie Georges, MD as PCP - General (Family Medicine) Jonelle Sidle, MD as PCP - Cardiology (Cardiology) Ermalinda Barrios, MD as  Consulting Physician (Otolaryngology) Cherlyn Roberts, MD as Consulting Physician (Dermatology) Romualdo Bolk, MD as Consulting Physician (Obstetrics and Gynecology)   Outpatient Medications Prior to Visit  Medication Sig   acetaminophen (TYLENOL) 500 MG tablet Take 1,000 mg by mouth every 6 (six) hours as needed (pain).   albuterol (VENTOLIN HFA) 108 (90 Base) MCG/ACT inhaler Inhale 2 puffs into the lungs every 6 (six) hours as needed. (Patient taking differently: Inhale 2 puffs into the lungs every 6 (six) hours as needed for wheezing or shortness of breath.)   amLODipine (NORVASC) 2.5 MG tablet Take 1 tablet by mouth once daily   aspirin EC 81 MG EC tablet Take 1 tablet (81 mg total) by mouth daily. Swallow whole.   atorvastatin (LIPITOR) 10 MG tablet Take 1 tablet by mouth once daily   cholecalciferol (VITAMIN D) 25 MCG (1000 UNIT) tablet Take 1,000 Units by mouth every morning.   Cyanocobalamin (B-12) 1000 MCG TABS Take 1,000 mcg by mouth every morning.   FOLIC ACID PO Take 1 tablet by mouth every morning.   levocetirizine (XYZAL) 5 MG tablet Take 1 tablet (5 mg total) by mouth every evening.   LORazepam (ATIVAN) 0.5 MG tablet Take 0.5-1 tablets (0.25-0.5 mg total) by mouth every 8 (eight) hours as needed for anxiety.   meloxicam (MOBIC) 15 MG tablet Take 1 tablet (15 mg total) by mouth daily.  metoprolol succinate (TOPROL-XL) 100 MG 24 hr tablet Take 1 tablet (100 mg total) by mouth daily after supper.   omeprazole (PRILOSEC OTC) 20 MG tablet Take 20 mg by mouth daily as needed (acid reflux/heartburn).   SYNTHROID 75 MCG tablet Take 1 tablet (75 mcg total) by mouth daily before breakfast.   [DISCONTINUED] alendronate (FOSAMAX) 70 MG tablet Take 1 tablet (70 mg total) by mouth every 7 (seven) days. Take with a full glass of water on an empty stomach.   No facility-administered medications prior to visit.    Review of Systems  HENT:  Negative for hearing loss.   Eyes:   Negative for blurred vision.  Respiratory:  Negative for shortness of breath.   Cardiovascular:  Negative for chest pain.  Gastrointestinal: Negative.   Genitourinary: Negative.   Musculoskeletal:  Negative for back pain.  Neurological:  Negative for headaches.  Psychiatric/Behavioral:  Negative for depression.   All other systems reviewed and are negative.         Objective:     BP 122/80 (BP Location: Left Arm, Patient Position: Sitting, Cuff Size: Normal)   Pulse (!) 59   Temp 97.9 F (36.6 C) (Oral)   Ht  (1.727 m)   Wt 132 lb 9.6 oz (60.1 kg)   SpO2 100%   BMI 20.16 kg/m    Physical Exam Vitals reviewed.  Constitutional:      Appearance: Normal appearance. She is well-groomed and normal weight.  HENT:     Right Ear: Tympanic membrane normal.     Left Ear: Tympanic membrane normal.     Mouth/Throat:     Mouth: Mucous membranes are moist.     Pharynx: No posterior oropharyngeal erythema.  Eyes:     Conjunctiva/sclera: Conjunctivae normal.  Neck:     Thyroid: No thyromegaly.  Cardiovascular:     Rate and Rhythm: Normal rate and regular rhythm.     Pulses: Normal pulses.     Heart sounds: S1 normal and S2 normal.  Pulmonary:     Effort: Pulmonary effort is normal.     Breath sounds: Normal breath sounds and air entry.  Abdominal:     General: Bowel sounds are normal.  Musculoskeletal:     Right lower leg: No edema.     Left lower leg: No edema.  Lymphadenopathy:     Cervical: No cervical adenopathy.  Neurological:     Mental Status: She is alert and oriented to person, place, and time. Mental status is at baseline.     Gait: Gait is intact.  Psychiatric:        Mood and Affect: Mood and affect normal.        Speech: Speech normal.        Behavior: Behavior normal.        Judgment: Judgment normal.        No results found for any visits on 03/24/23. Last CBC Lab Results  Component Value Date   WBC 4.7 02/19/2023   HGB 14.3 02/19/2023    HCT 43.0 02/19/2023   MCV 93.4 02/19/2023   MCH 31.0 12/12/2021   RDW 12.9 02/19/2023   PLT 254.0 02/19/2023   Last metabolic panel Lab Results  Component Value Date   GLUCOSE 91 02/19/2023   NA 142 02/19/2023   K 4.2 02/19/2023   CL 108 02/19/2023   CO2 26 02/19/2023   BUN 15 02/19/2023   CREATININE 0.82 02/19/2023   GFRNONAA >60 12/12/2021   CALCIUM  9.4 02/19/2023   PROT 7.1 02/19/2023   ALBUMIN 4.0 02/19/2023   BILITOT 0.6 02/19/2023   ALKPHOS 64 02/19/2023   AST 17 02/19/2023   ALT 8 02/19/2023   ANIONGAP 9 12/12/2021   Last lipids Lab Results  Component Value Date   CHOL 124 02/19/2023   HDL 56.50 02/19/2023   LDLCALC 50 02/19/2023   TRIG 84.0 02/19/2023   CHOLHDL 2 02/19/2023   Last thyroid functions Lab Results  Component Value Date   TSH 1.16 02/19/2023   Last vitamin D Lab Results  Component Value Date   VD25OH 22.97 (L) 02/19/2023        Assessment & Plan:    Routine Health Maintenance and Physical Exam  Immunization History  Administered Date(s) Administered   Fluad Quad(high Dose 65+) 10/05/2019, 09/24/2020, 08/21/2022   Influenza Split 09/21/2011, 09/05/2012   Influenza Whole 10/07/2001, 09/23/2007, 09/06/2008, 10/10/2009   Influenza, High Dose Seasonal PF 09/15/2018   Influenza,inj,Quad PF,6+ Mos 10/09/2013, 09/06/2014, 08/14/2016   Influenza-Unspecified 09/14/2017, 09/19/2018, 10/06/2021   Moderna Sars-Covid-2 Vaccination 01/19/2020, 02/16/2020, 10/15/2020   PFIZER Comirnaty(Gray Top)Covid-19 Tri-Sucrose Vaccine 07/22/2021   Pfizer Covid-19 Vaccine Bivalent Booster 39yrs & up 02/04/2022   Pneumococcal Conjugate-13 12/11/2016   Pneumococcal Polysaccharide-23 12/30/2017   Td 02/04/1998, 09/23/2007, 09/30/2017   Zoster, Live 06/03/2012    Health Maintenance  Topic Date Due   Zoster Vaccines- Shingrix (1 of 2) 09/23/2023 (Originally 08/26/1970)   COVID-19 Vaccine (6 - 2023-24 season) 03/23/2024 (Originally 08/07/2022)   INFLUENZA VACCINE   07/08/2023   Medicare Annual Wellness (AWV)  12/12/2023   Fecal DNA (Cologuard)  12/12/2023   MAMMOGRAM  02/09/2025   DEXA SCAN  02/09/2025   DTaP/Tdap/Td (4 - Tdap) 10/01/2027   Pneumonia Vaccine 71+ Years old  Completed   Hepatitis C Screening  Addressed   HPV VACCINES  Aged Out    Discussed health benefits of physical activity, and encouraged her to engage in regular exercise appropriate for her age and condition.  Problem List Items Addressed This Visit       Unprioritized   Osteoporosis   Relevant Medications   alendronate (FOSAMAX) 70 MG tablet   Other Visit Diagnoses     Routine general medical examination at a health care facility    -  Primary     Normal physical exam findings. We talked again about the fosamax and the risks/benefits of taking the medication and she has agreed to try it. New rx sent in. I also advised she increase exercise to 30 minutes 5 days a week. Handouts given. Return in 6 months (on 09/23/2023).     Karie Georges, MD

## 2023-04-30 DIAGNOSIS — Z043 Encounter for examination and observation following other accident: Secondary | ICD-10-CM | POA: Diagnosis not present

## 2023-04-30 DIAGNOSIS — Y92832 Beach as the place of occurrence of the external cause: Secondary | ICD-10-CM | POA: Diagnosis not present

## 2023-04-30 DIAGNOSIS — S0181XA Laceration without foreign body of other part of head, initial encounter: Secondary | ICD-10-CM | POA: Diagnosis not present

## 2023-04-30 DIAGNOSIS — W01198A Fall on same level from slipping, tripping and stumbling with subsequent striking against other object, initial encounter: Secondary | ICD-10-CM | POA: Diagnosis not present

## 2023-05-10 ENCOUNTER — Other Ambulatory Visit: Payer: Self-pay | Admitting: Cardiology

## 2023-05-10 ENCOUNTER — Other Ambulatory Visit: Payer: Self-pay | Admitting: Family Medicine

## 2023-05-13 ENCOUNTER — Ambulatory Visit: Payer: Medicare PPO | Attending: Cardiology | Admitting: Cardiology

## 2023-05-13 ENCOUNTER — Encounter: Payer: Self-pay | Admitting: Cardiology

## 2023-05-13 VITALS — BP 112/82 | HR 59 | Ht 67.0 in | Wt 136.2 lb

## 2023-05-13 DIAGNOSIS — R002 Palpitations: Secondary | ICD-10-CM

## 2023-05-13 DIAGNOSIS — E782 Mixed hyperlipidemia: Secondary | ICD-10-CM | POA: Diagnosis not present

## 2023-05-13 DIAGNOSIS — I201 Angina pectoris with documented spasm: Secondary | ICD-10-CM

## 2023-05-13 NOTE — Patient Instructions (Signed)
Medication Instructions:  Your physician recommends that you continue on your current medications as directed. Please refer to the Current Medication list given to you today.   Labwork: None today  Testing/Procedures: None today  Follow-Up: 6 months  Any Other Special Instructions Will Be Listed Below (If Applicable).  If you need a refill on your cardiac medications before your next appointment, please call your pharmacy.  

## 2023-05-13 NOTE — Progress Notes (Signed)
    Cardiology Office Note  Date: 05/13/2023   ID: JANAYAH SEIPLE, DOB 03-26-1951, MRN [id number]  History of Present Illness: Tracey Giles is a 72 y.o. female last seen in November 2023.  She is here for a routine visit.  She does not report any exertional chest pain with typical activities, no increasing shortness of breath, no syncope.  Has chronic intermittent sense of palpitations, this has not progressed.  I reviewed her medications which are stable from a cardiac perspective.  She does not report any intolerances.  LDL was 50 in March on Lipitor 80 mg daily which she has tolerated.  ECG today shows sinus bradycardia.  Physical Exam: VS:  BP 112/82   Pulse (!) 59   Ht 5\' 7"  (1.702 m)   Wt 136 lb 3.2 oz (61.8 kg)   SpO2 98%   BMI 21.33 kg/m , BMI Body mass index is 21.33 kg/m.  Wt Readings from Last 3 Encounters:  05/13/23 136 lb 3.2 oz (61.8 kg)  03/24/23 132 lb 9.6 oz (60.1 kg)  12/11/22 133 lb 11.2 oz (60.6 kg)    General: Patient appears comfortable at rest. Neck: Supple, no elevated JVP or carotid bruits. Lungs: Clear to auscultation, nonlabored breathing at rest. Cardiac: Regular rate and rhythm, no S3 or significant systolic murmur. Extremities: No pitting edema.  ECG:  An ECG dated 05/12/2022 was personally reviewed today and demonstrated:  Sinus rhythm with nonspecific ST changes.  Labwork: 02/19/2023: ALT 8; AST 17; BUN 15; Creatinine, Ser 0.82; Hemoglobin 14.3; Platelets 254.0; Potassium 4.2; Sodium 142; TSH 1.16     Component Value Date/Time   CHOL 124 02/19/2023 0845   TRIG 84.0 02/19/2023 0845   HDL 56.50 02/19/2023 0845   CHOLHDL 2 02/19/2023 0845   VLDL 16.8 02/19/2023 0845   LDLCALC 50 02/19/2023 0845   Other Studies Reviewed Today:  No interval cardiac testing for review today.  Assessment and Plan:  1.  History of NSTEMI in January 2023 felt to be most likely associated with coronary vasospasm with cardiac catheterization  demonstrating only minimal coronary luminal irregularities.  LVEF greater than 65% without regional wall motion abnormalities.  She remains symptomatically stable without recurrent angina on medical therapy.  Continue aspirin, Norvasc, and Lipitor.  2.  Palpitations.  She remains on Toprol-XL.  3.  Mixed hyperlipidemia.  LDL 50 in March on Lipitor 80 mg daily.  Disposition:  Follow up  6 months.  Signed, Jonelle Sidle, M.D., F.A.C.C.  [facility]

## 2023-07-07 ENCOUNTER — Encounter (INDEPENDENT_AMBULATORY_CARE_PROVIDER_SITE_OTHER): Payer: Self-pay

## 2023-07-14 DIAGNOSIS — H2513 Age-related nuclear cataract, bilateral: Secondary | ICD-10-CM | POA: Diagnosis not present

## 2023-07-14 DIAGNOSIS — H5203 Hypermetropia, bilateral: Secondary | ICD-10-CM | POA: Diagnosis not present

## 2023-07-22 ENCOUNTER — Encounter: Payer: Self-pay | Admitting: Family Medicine

## 2023-07-22 ENCOUNTER — Ambulatory Visit: Payer: Medicare PPO | Admitting: Family Medicine

## 2023-07-22 VITALS — BP 130/70 | HR 63 | Temp 98.3°F | Ht 67.0 in | Wt 138.4 lb

## 2023-07-22 DIAGNOSIS — E039 Hypothyroidism, unspecified: Secondary | ICD-10-CM

## 2023-07-22 DIAGNOSIS — I10 Essential (primary) hypertension: Secondary | ICD-10-CM

## 2023-07-22 DIAGNOSIS — M1990 Unspecified osteoarthritis, unspecified site: Secondary | ICD-10-CM | POA: Insufficient documentation

## 2023-07-22 DIAGNOSIS — M1909 Primary osteoarthritis, other specified site: Secondary | ICD-10-CM

## 2023-07-22 MED ORDER — MELOXICAM 15 MG PO TABS
15.0000 mg | ORAL_TABLET | Freq: Every day | ORAL | 1 refills | Status: AC
Start: 2023-07-22 — End: ?

## 2023-07-22 MED ORDER — METOPROLOL SUCCINATE ER 100 MG PO TB24: 100.0000 mg | ORAL_TABLET | Freq: Every day | ORAL | 1 refills | Status: DC

## 2023-07-22 MED ORDER — SYNTHROID 75 MCG PO TABS: 75.0000 ug | ORAL_TABLET | Freq: Every day | ORAL | 1 refills | Status: DC

## 2023-07-22 NOTE — Progress Notes (Signed)
Established Patient Office Visit  Subjective   Patient ID: Tracey Giles, female    DOB: 09/21/1951  Age: 72 y.o. MRN: [id number]  Chief Complaint  Patient presents with   Medication Refill    Pt is here for medication refills today. She reports she is having some rib pain recently in the mornings when she wakes up. States that it does eventually go away and then she is fine the rest of the day. She denies any associated symptoms, no chest pain, no fever/chills, no coughing or SOB.   Needs refills on her thyroid medications and her metoprolol today.     Current Outpatient Medications  Medication Instructions   acetaminophen (TYLENOL) 1,000 mg, Oral, Every 6 hours PRN   albuterol (VENTOLIN HFA) 108 (90 Base) MCG/ACT inhaler 2 puffs, Inhalation, Every 6 hours PRN   alendronate (FOSAMAX) 70 mg, Oral, Every 7 days, Take with a full glass of water on an empty stomach.   amLODipine (NORVASC) 2.5 mg, Oral, Daily   aspirin EC 81 mg, Oral, Daily, Swallow whole.   atorvastatin (LIPITOR) 10 mg, Oral, Daily   B-12 1,000 mcg, Oral, Every morning   cholecalciferol (VITAMIN D3) 1,000 Units, Oral, Every morning   FOLIC ACID PO 1 tablet, Oral, Every morning   levocetirizine (XYZAL) 5 mg, Oral, Every evening   LORazepam (ATIVAN) 0.25-0.5 mg, Oral, Every 8 hours PRN   meloxicam (MOBIC) 15 mg, Oral, Daily   metoprolol succinate (TOPROL-XL) 100 mg, Oral, Daily, Take with or immediately following a meal.   omeprazole (PRILOSEC OTC) 20 mg, Oral, Daily PRN   Synthroid 75 mcg, Oral, Daily before breakfast    Patient Active Problem List   Diagnosis Date Noted   DJD (degenerative joint disease) 07/22/2023   Intestinal gas excretion 08/27/2022   Essential hypertension    Non-ST elevation (NSTEMI) myocardial infarction (HCC) 12/14/2021   Chest pain at rest 12/12/2021   ACS (acute coronary syndrome) (HCC)    Abnormal reflex 10/18/2019   Numbness 10/18/2019   B12 deficiency 10/18/2019    Osteoporosis 01/04/2017   Exercise-induced asthma 11/20/2016   Upper airway cough syndrome 07/01/2016   Dyspnea 07/01/2016   Vitamin D insufficiency 05/24/2016   Hyperlipidemia 05/24/2016   Palpitations 11/15/2014   Meniere's disease 07/21/2007   GASTROESOPHAGEAL REFLUX DISEASE, SEVERE 07/21/2007   Hypothyroidism 07/08/2007   Allergic rhinitis 07/08/2007      Review of Systems  All other systems reviewed and are negative.     Objective:     BP 130/70 (BP Location: Left Arm, Patient Position: Sitting, Cuff Size: Normal)   Pulse 63   Temp 98.3 F (36.8 C) (Oral)   Ht 5\' 7"  (1.702 m)   Wt 138 lb 6.4 oz (62.8 kg)   SpO2 98%   BMI 21.68 kg/m    Physical Exam Vitals reviewed.  Constitutional:      Appearance: Normal appearance.  Cardiovascular:     Rate and Rhythm: Normal rate and regular rhythm.     Heart sounds: No murmur heard. Pulmonary:     Effort: Pulmonary effort is normal.     Breath sounds: Normal breath sounds. No wheezing, rhonchi or rales.  Neurological:     Mental Status: She is alert and oriented to person, place, and time.  Psychiatric:        Mood and Affect: Mood normal.        Behavior: Behavior normal.      No results found for any visits on 07/22/23.  The ASCVD Risk score (Arnett DK, et al., 2019) failed to calculate for the following reasons:   The patient has a prior MI or stroke diagnosis    Assessment & Plan:  Hypothyroidism, unspecified type -     Synthroid; Take 1 tablet (75 mcg total) by mouth daily before breakfast.  Dispense: 90 tablet; Refill: 1  Essential hypertension -     Metoprolol Succinate ER; Take 1 tablet (100 mg total) by mouth daily. Take with or immediately following a meal.  Dispense: 90 tablet; Refill: 1  Primary osteoarthritis of other site -     Meloxicam; Take 1 tablet (15 mg total) by mouth daily.  Dispense: 90 tablet; Refill: 1   I discussed that sometimes fosamax can cause bone pain, we discussed this  possibility, states that it only started recently and it does not last long in the mornings. I recommended putting her back on the meloxicam once daily to see if this will help. If not then we may need to trial her off the fosamax to see if the pain resolves.  Return in about 6 months (around 01/22/2024) for follow up.    Karie Georges, MD

## 2023-08-13 ENCOUNTER — Telehealth: Payer: Self-pay | Admitting: *Deleted

## 2023-08-13 DIAGNOSIS — M810 Age-related osteoporosis without current pathological fracture: Secondary | ICD-10-CM

## 2023-08-13 MED ORDER — ALENDRONATE SODIUM 70 MG PO TABS
70.0000 mg | ORAL_TABLET | ORAL | 1 refills | Status: DC
Start: 2023-08-13 — End: 2024-01-20

## 2023-08-13 NOTE — Telephone Encounter (Signed)
Rx done. 

## 2023-09-20 ENCOUNTER — Encounter: Payer: Self-pay | Admitting: Family Medicine

## 2023-09-20 ENCOUNTER — Ambulatory Visit: Payer: Medicare PPO | Admitting: Family Medicine

## 2023-09-20 VITALS — BP 108/80 | HR 61 | Temp 98.3°F | Ht 67.0 in | Wt 140.0 lb

## 2023-09-20 DIAGNOSIS — J029 Acute pharyngitis, unspecified: Secondary | ICD-10-CM | POA: Diagnosis not present

## 2023-09-20 DIAGNOSIS — Z23 Encounter for immunization: Secondary | ICD-10-CM

## 2023-09-20 LAB — POCT RAPID STREP A (OFFICE): Rapid Strep A Screen: NEGATIVE

## 2023-09-20 NOTE — Progress Notes (Signed)
Established Patient Office Visit  Subjective   Patient ID: Tracey Giles, female    DOB: 1951-10-26  Age: 72 y.o. MRN: [id number]  Chief Complaint  Patient presents with  . Mouth Lesions    Patient states she was seen by the dentist one week ago and referred here due to red spots along the palate, denies sore throat, only sinus drainage x1 week    Pt reports that she went to the dentist 1 week ago and he told her she had "red spots' on her soft palate. Patient denies any sore throat, maybe a burning sensation, roof of her mouth feels a little raw. States that she has been having some excess sinus drainage down the back of her throat. No fever or chills, no other signs of infection.   Current Outpatient Medications  Medication Instructions  . acetaminophen (TYLENOL) 1,000 mg, Oral, Every 6 hours PRN  . albuterol (VENTOLIN HFA) 108 (90 Base) MCG/ACT inhaler 2 puffs, Inhalation, Every 6 hours PRN  . alendronate (FOSAMAX) 70 mg, Oral, Every 7 days, Take with a full glass of water on an empty stomach.  Marland Kitchen amLODipine (NORVASC) 2.5 mg, Oral, Daily  . aspirin EC 81 mg, Oral, Daily, Swallow whole.  Marland Kitchen atorvastatin (LIPITOR) 10 mg, Oral, Daily  . B-12 1,000 mcg, Oral, Every morning  . cholecalciferol (VITAMIN D3) 1,000 Units, Oral, Every morning  . FOLIC ACID PO 1 tablet, Oral, Every morning  . levocetirizine (XYZAL) 5 mg, Oral, Every evening  . LORazepam (ATIVAN) 0.25-0.5 mg, Oral, Every 8 hours PRN  . meloxicam (MOBIC) 15 mg, Oral, Daily  . metoprolol succinate (TOPROL-XL) 100 mg, Oral, Daily, Take with or immediately following a meal.  . omeprazole (PRILOSEC OTC) 20 mg, Oral, Daily PRN  . Synthroid 75 mcg, Oral, Daily before breakfast    Patient Active Problem List   Diagnosis Date Noted  . DJD (degenerative joint disease) 07/22/2023  . Intestinal gas excretion 08/27/2022  . Essential hypertension   . Non-ST elevation (NSTEMI) myocardial infarction (HCC) 12/14/2021  . Chest  pain at rest 12/12/2021  . ACS (acute coronary syndrome) (HCC)   . Abnormal reflex 10/18/2019  . Numbness 10/18/2019  . B12 deficiency 10/18/2019  . Osteoporosis 01/04/2017  . Exercise-induced asthma 11/20/2016  . Upper airway cough syndrome 07/01/2016  . Dyspnea 07/01/2016  . Vitamin D insufficiency 05/24/2016  . Hyperlipidemia 05/24/2016  . Palpitations 11/15/2014  . Meniere's disease 07/21/2007  . GASTROESOPHAGEAL REFLUX DISEASE, SEVERE 07/21/2007  . Hypothyroidism 07/08/2007  . Allergic rhinitis 07/08/2007      Review of Systems  All other systems reviewed and are negative.     Objective:     BP 108/80 (BP Location: Left Arm, Patient Position: Sitting, Cuff Size: Normal)   Pulse 61   Temp 98.3 F (36.8 C) (Oral)   Ht 5\' 7"  (1.702 m)   Wt 140 lb (63.5 kg)   SpO2 99%   BMI 21.93 kg/m    Physical Exam Vitals reviewed.  Constitutional:      Appearance: Normal appearance. She is normal weight.  HENT:     Nose: No congestion.     Mouth/Throat:     Mouth: Mucous membranes are moist.     Pharynx: No posterior oropharyngeal erythema.      Comments: Soft palate appears normal, there is a slight red dot on the soft palate but this appears to be a small capillary. No white patches, no other erythema in the oropharynx. Neurological:  Mental Status: She is alert.     Results for orders placed or performed in visit on 09/20/23  POC Rapid Strep A  Result Value Ref Range   Rapid Strep A Screen Negative Negative      The ASCVD Risk score (Arnett DK, et al., 2019) failed to calculate for the following reasons:   The patient has a prior MI or stroke diagnosis    Assessment & Plan:  Sore throat -     POCT rapid strep A  Immunization due -     Flu Vaccine Trivalent High Dose (Fluad)  Need for immunization against influenza   Strep test is negative, the soft palate appears clear today, differential could be viral illness vs allergies, recommended she go back  on her antihistamine and see if this helps with the sinus drainage.   No follow-ups on file.    Karie Georges, MD

## 2023-10-20 ENCOUNTER — Encounter: Payer: Self-pay | Admitting: Family Medicine

## 2023-10-28 DIAGNOSIS — H8102 Meniere's disease, left ear: Secondary | ICD-10-CM | POA: Diagnosis not present

## 2023-10-28 DIAGNOSIS — H903 Sensorineural hearing loss, bilateral: Secondary | ICD-10-CM | POA: Diagnosis not present

## 2023-10-28 DIAGNOSIS — H90A22 Sensorineural hearing loss, unilateral, left ear, with restricted hearing on the contralateral side: Secondary | ICD-10-CM | POA: Diagnosis not present

## 2023-10-28 DIAGNOSIS — H938X3 Other specified disorders of ear, bilateral: Secondary | ICD-10-CM | POA: Diagnosis not present

## 2023-10-28 DIAGNOSIS — H9313 Tinnitus, bilateral: Secondary | ICD-10-CM | POA: Diagnosis not present

## 2023-10-28 DIAGNOSIS — H6123 Impacted cerumen, bilateral: Secondary | ICD-10-CM | POA: Diagnosis not present

## 2023-10-28 DIAGNOSIS — Z01118 Encounter for examination of ears and hearing with other abnormal findings: Secondary | ICD-10-CM | POA: Diagnosis not present

## 2023-11-16 ENCOUNTER — Encounter: Payer: Self-pay | Admitting: Cardiology

## 2023-11-16 ENCOUNTER — Ambulatory Visit: Payer: Medicare PPO | Attending: Cardiology | Admitting: Cardiology

## 2023-11-16 VITALS — BP 124/80 | HR 69 | Ht 68.0 in | Wt 142.0 lb

## 2023-11-16 DIAGNOSIS — E782 Mixed hyperlipidemia: Secondary | ICD-10-CM | POA: Diagnosis not present

## 2023-11-16 DIAGNOSIS — I201 Angina pectoris with documented spasm: Secondary | ICD-10-CM

## 2023-11-16 DIAGNOSIS — R002 Palpitations: Secondary | ICD-10-CM | POA: Diagnosis not present

## 2023-11-16 NOTE — Progress Notes (Signed)
    Cardiology Office Note  Date: 11/16/2023   ID: Tracey Giles, DOB 08-09-1951, MRN [id number]  History of Present Illness: Tracey Giles is a 72 y.o. female last seen in June.  She is here for a routine visit.  Reports no chest pain or increasing dyspnea on exertion, still has intermittent sense of palpitations mainly when she is still in the evenings.  I reviewed her medications.  Current regimen includes aspirin, Norvasc, Lipitor, and Toprol-XL.  She has had no obvious intolerances.  Blood pressure is adequately controlled today and her last LDL was 50.  She will have lab work with PCP later in the year.  Physical Exam: VS:  BP 124/80   Pulse 69   Ht 5\' 8"  (1.727 m)   Wt 142 lb (64.4 kg)   SpO2 96%   BMI 21.59 kg/m , BMI Body mass index is 21.59 kg/m.  Wt Readings from Last 3 Encounters:  11/16/23 142 lb (64.4 kg)  09/20/23 140 lb (63.5 kg)  07/22/23 138 lb 6.4 oz (62.8 kg)    General: Patient appears comfortable at rest. HEENT: Conjunctiva and lids normal. Neck: Supple, no elevated JVP or carotid bruits. Lungs: Clear to auscultation, nonlabored breathing at rest. Cardiac: Regular rate and rhythm, no S3 or significant systolic murmur. Extremities: No pitting edema.  ECG:  An ECG dated 05/13/2023 was personally reviewed today and demonstrated:  Sinus bradycardia.  Labwork: 02/19/2023: ALT 8; AST 17; BUN 15; Creatinine, Ser 0.82; Hemoglobin 14.3; Platelets 254.0; Potassium 4.2; Sodium 142; TSH 1.16     Component Value Date/Time   CHOL 124 02/19/2023 0845   TRIG 84.0 02/19/2023 0845   HDL 56.50 02/19/2023 0845   CHOLHDL 2 02/19/2023 0845   VLDL 16.8 02/19/2023 0845   LDLCALC 50 02/19/2023 0845   Other Studies Reviewed Today:  No interval cardiac testing for review today.  Assessment and Plan:  1.  History of NSTEMI in January 2023 felt to be most likely associated with coronary vasospasm with cardiac catheterization demonstrating only minimal  coronary luminal irregularities.  LVEF greater than 65% without regional wall motion abnormalities.  She does not report any recurring angina at this time and plans to continue medical therapy.  Currently on aspirin, Norvasc, and Lipitor.   2.  Palpitations.  She remains on Toprol-XL.  No progressive symptoms, would continue with current dose.   3.  Mixed hyperlipidemia.  LDL 50 in March on Lipitor 80 mg daily.  Disposition:  Follow up  6 months.  Signed, Jonelle Sidle, M.D., F.A.C.C. [facility]

## 2023-11-16 NOTE — Patient Instructions (Signed)
Medication Instructions:  Your physician recommends that you continue on your current medications as directed. Please refer to the Current Medication list given to you today.   Labwork: None today  Testing/Procedures: None today  Follow-Up: 6 months  Any Other Special Instructions Will Be Listed Below (If Applicable).  If you need a refill on your cardiac medications before your next appointment, please call your pharmacy.  

## 2023-12-20 ENCOUNTER — Ambulatory Visit (INDEPENDENT_AMBULATORY_CARE_PROVIDER_SITE_OTHER): Payer: Medicare PPO

## 2023-12-20 VITALS — Ht 68.0 in | Wt 142.0 lb

## 2023-12-20 DIAGNOSIS — Z0000 Encounter for general adult medical examination without abnormal findings: Secondary | ICD-10-CM

## 2023-12-20 NOTE — Progress Notes (Signed)
 Subjective:   Tracey Giles is a 73 y.o. female who presents for Medicare Annual (Subsequent) preventive examination.  Visit Complete: Virtual I connected with  HAIDEE STOGSDILL on 12/20/23 by a audio enabled telemedicine application and verified that I am speaking with the correct person using two identifiers.  Patient Location: Home  Provider Location: Home Office  I discussed the limitations of evaluation and management by telemedicine. The patient expressed understanding and agreed to proceed.  Vital Signs: Because this visit was a virtual/telehealth visit, some criteria may be missing or patient reported. Any vitals not documented were not able to be obtained and vitals that have been documented are patient reported.  Patient Medicare AWV questionnaire was completed by the patient on 12/16/23; I have confirmed that all information answered by patient is correct and no changes since this date.  Cardiac Risk Factors include: advanced age (>28men, >64 women);hypertension     Objective:    Today's Vitals   12/20/23 1509  Weight: 142 lb (64.4 kg)  Height: 5' 8 (1.727 m)   Body mass index is 21.59 kg/m.     12/20/2023    3:17 PM 12/11/2022    3:28 PM 07/02/2022    1:39 PM 12/12/2021    4:18 PM 11/25/2021   12:09 PM 11/19/2020   11:09 AM 05/07/2020    9:38 AM  Advanced Directives  Does Patient Have a Medical Advance Directive? No No No No No No No  Would patient like information on creating a medical advance directive? No - Patient declined No - Patient declined No - Patient declined No - Patient declined No - Patient declined No - Patient declined No - Patient declined    Current Medications (verified) Outpatient Encounter Medications as of 12/20/2023  Medication Sig   acetaminophen  (TYLENOL ) 500 MG tablet Take 1,000 mg by mouth every 6 (six) hours as needed (pain).   albuterol  (VENTOLIN  HFA) 108 (90 Base) MCG/ACT inhaler Inhale 2 puffs into the lungs every 6 (six)  hours as needed. (Patient taking differently: Inhale 2 puffs into the lungs every 6 (six) hours as needed for wheezing or shortness of breath.)   alendronate  (FOSAMAX ) 70 MG tablet Take 1 tablet (70 mg total) by mouth every 7 (seven) days. Take with a full glass of water on an empty stomach.   amLODipine  (NORVASC ) 2.5 MG tablet Take 1 tablet by mouth once daily   aspirin  EC 81 MG EC tablet Take 1 tablet (81 mg total) by mouth daily. Swallow whole.   atorvastatin  (LIPITOR) 10 MG tablet Take 1 tablet by mouth once daily   cholecalciferol (VITAMIN D ) 25 MCG (1000 UNIT) tablet Take 1,000 Units by mouth every morning.   Cyanocobalamin  (B-12) 1000 MCG TABS Take 1,000 mcg by mouth every morning.   FOLIC ACID  PO Take 1 tablet by mouth every morning.   levocetirizine (XYZAL ) 5 MG tablet Take 1 tablet (5 mg total) by mouth every evening.   LORazepam  (ATIVAN ) 0.5 MG tablet Take 0.5-1 tablets (0.25-0.5 mg total) by mouth every 8 (eight) hours as needed for anxiety.   meloxicam  (MOBIC ) 15 MG tablet Take 1 tablet (15 mg total) by mouth daily.   metoprolol  succinate (TOPROL -XL) 100 MG 24 hr tablet Take 1 tablet (100 mg total) by mouth daily. Take with or immediately following a meal.   omeprazole  (PRILOSEC OTC) 20 MG tablet Take 20 mg by mouth daily as needed (acid reflux/heartburn).   SYNTHROID  75 MCG tablet Take 1 tablet (75 mcg  total) by mouth daily before breakfast.   No facility-administered encounter medications on file as of 12/20/2023.    Allergies (verified) Tums [calcium  carbonate antacid], Cephalexin, Penicillins, and Sulfamethoxazole-trimethoprim   History: Past Medical History:  Diagnosis Date   Anxiety    Asthma    exercise induced asthma    Essential hypertension    GERD (gastroesophageal reflux disease)    Heart murmur    mitral valve prolapse   History of dermatitis    History of migraine headaches yrs ago, none recent   Hyperlipemia 05/24/2016   Hypothyroidism    Meniere's  disease    NSTEMI (non-ST elevated myocardial infarction) (HCC)    a. s/p NSTEMI in 12/2021 with cath showing normal cors and felt to possibly be secondary to spasm.   Palpitations    Rare PVCs by Holter monitor April 2011    Sleep apnea    Reportedly mild, no cpap needed   Tingling    both legs saw dr vear 04-23-2020 no cause found   Vitamin D  insufficiency 05/24/2016   Past Surgical History:  Procedure Laterality Date   APPENDECTOMY  as child   BREAST LUMPECTOMY  yrs ago   Benign   DILATATION & CURETTAGE/HYSTEROSCOPY WITH MYOSURE N/A 05/07/2020   Procedure: DILATATION & CURETTAGE/HYSTEROSCOPY WITH MYOSURE/POLYP REMOVAL;  Surgeon: Jannis Kate Norris, MD;  Location: [facility];  Service: Gynecology;  Laterality: N/A;  polyp removal   LEFT HEART CATH AND CORONARY ANGIOGRAPHY N/A 12/12/2021   Procedure: LEFT HEART CATH AND CORONARY ANGIOGRAPHY;  Surgeon: Wonda Sharper, MD;  Location: [facility] INVASIVE CV LAB;  Service: Cardiovascular;  Laterality: N/A;   Family History  Problem Relation Age of Onset   Melanoma Father 3   Stroke Mother    Asthma Mother    Stroke Maternal Grandfather    Asthma Brother    Deep vein thrombosis Brother 45       had back surgery with post op complications of blood clots which were fatal   Thyroid  disease Neg Hx    Social History   Socioeconomic History   Marital status: Married    Spouse name: Debby   Number of children: 0   Years of education: 14   Highest education level: Associate degree: academic program  Occupational History   Occupation: Development Worker, International Aid: [employer]  Tobacco Use   Smoking status: Never   Smokeless tobacco: Never  Vaping Use   Vaping status: Never Used  Substance and Sexual Activity   Alcohol use: No   Drug use: No   Sexual activity: Not Currently  Other Topics Concern   Not on file  Social History Narrative   Work or School: retired - used to work for [employer] Situation:  lives with husband      Spiritual Beliefs: Christian      Lifestyle: no regular exercise; diet is healthy      Right handed    No caffeine use:    Social Drivers of Corporate Investment Banker Strain: Low Risk  (12/20/2023)   Overall Financial Resource Strain (CARDIA)    Difficulty of Paying Living Expenses: Not hard at all  Food Insecurity: No Food Insecurity (12/20/2023)   Hunger Vital Sign    Worried About Running Out of Food in the Last Year: Never true    Ran Out of Food in the Last Year: Never true  Transportation Needs: No Transportation Needs (12/20/2023)  PRAPARE - Administrator, Civil Service (Medical): No    Lack of Transportation (Non-Medical): No  Physical Activity: Insufficiently Active (12/20/2023)   Exercise Vital Sign    Days of Exercise per Week: 3 days    Minutes of Exercise per Session: 20 min  Stress: No Stress Concern Present (12/20/2023)   [employer] of Occupational Health - Occupational Stress Questionnaire    Feeling of Stress : Not at all  Social Connections: Socially Integrated (12/20/2023)   Social Connection and Isolation Panel [NHANES]    Frequency of Communication with Friends and Family: More than three times a week    Frequency of Social Gatherings with Friends and Family: More than three times a week    Attends Religious Services: More than 4 times per year    Active Member of [employer] or Organizations: Yes    Attends Engineer, Structural: More than 4 times per year    Marital Status: Married    Tobacco Counseling Counseling given: Not Answered   Clinical Intake:  Pre-visit preparation completed: Yes  Pain : No/denies pain     BMI - recorded: 21.59 Nutritional Status: BMI of 19-24  Normal Nutritional Risks: None Diabetes: No  How often do you need to have someone help you when you read instructions, pamphlets, or other written materials from your doctor or pharmacy?: 1 - Never  Interpreter Needed?:  No  Information entered by :: Rojelio Blush LPN   Activities of Daily Living    12/20/2023    3:15 PM 12/16/2023    4:06 PM  In your present state of health, do you have any difficulty performing the following activities:  Hearing? 1 0  Comment Wears Hearing Aids   Vision? 0 0  Difficulty concentrating or making decisions? 0 0  Walking or climbing stairs? 0 0  Dressing or bathing? 0 0  Doing errands, shopping? 0 0  Preparing Food and eating ? N N  Using the Toilet? N N  In the past six months, have you accidently leaked urine? N N  Do you have problems with loss of bowel control? N N  Managing your Medications? N N  Managing your Finances? N N  Housekeeping or managing your Housekeeping? N N    Patient Care Team: Ozell Heron HERO, MD as PCP - General (Family Medicine) Debera Jayson MATSU, MD as PCP - Cardiology (Cardiology) Thaddeus Locus, MD as Consulting Physician (Otolaryngology) Ivin Kocher, MD as Consulting Physician (Dermatology) Jannis Kate Norris, MD (Inactive) as Consulting Physician (Obstetrics and Gynecology)  Indicate any recent Medical Services you may have received from other than [facility] providers in the past year (date may be approximate).     Assessment:   This is a routine wellness examination for Glessie.  Hearing/Vision screen Hearing Screening - Comments:: Wears Hearing Aides Vision Screening - Comments:: Wears rx glasses - up to date with routine eye exams with  [facility]   Goals Addressed               This Visit's Progress     Stay Active (pt-stated)         Depression Screen    12/20/2023    3:14 PM 07/22/2023    2:22 PM 03/24/2023   10:24 AM 12/11/2022    3:14 PM 08/21/2022   11:22 AM 12/03/2021    2:17 PM 11/25/2021   12:10 PM  PHQ 2/9 Scores  PHQ - 2 Score 0 0 0 0 0 0  0  PHQ- 9 Score  0   0 0     Fall Risk    12/20/2023    3:15 PM 12/16/2023    4:06 PM 07/22/2023    2:19 PM 03/24/2023   10:23 AM 12/11/2022    3:27 PM   Fall Risk   Falls in the past year? 1 1 1  0 0  Number falls in past yr: 0 0 0 0 0  Injury with Fall? 1 1 1  0 0  Comment Hit head. Followed by medical attention      Risk for fall due to : No Fall Risks  No Fall Risks No Fall Risks No Fall Risks  Follow up Falls prevention discussed  Falls evaluation completed Falls evaluation completed Falls prevention discussed    MEDICARE RISK AT HOME: Medicare Risk at Home Any stairs in or around the home?: (Patient-Rptd) Yes If so, are there any without handrails?: (Patient-Rptd) No Home free of loose throw rugs in walkways, pet beds, electrical cords, etc?: (Patient-Rptd) Yes Adequate lighting in your home to reduce risk of falls?: (Patient-Rptd) Yes Life alert?: (Patient-Rptd) No Use of a cane, walker or w/c?: (Patient-Rptd) No Grab bars in the bathroom?: (Patient-Rptd) Yes Shower chair or bench in shower?: (Patient-Rptd) No Elevated toilet seat or a handicapped toilet?: (Patient-Rptd) No  TIMED UP AND GO:  Was the test performed?  No    Cognitive Function:    10/04/2018    7:42 AM  MMSE - Mini Mental State Exam  Not completed: --        12/20/2023    3:17 PM 12/11/2022    3:28 PM 11/25/2021   12:10 PM 11/19/2020   11:12 AM  6CIT Screen  What Year? 0 points 0 points 0 points 0 points  What month? 0 points 0 points 0 points 0 points  What time? 0 points 0 points 0 points   Count back from 20 0 points 0 points 0 points 0 points  Months in reverse 0 points 0 points 0 points 0 points  Repeat phrase 0 points 0 points 2 points 0 points  Total Score 0 points 0 points 2 points     Immunizations Immunization History  Administered Date(s) Administered   Fluad Quad(high Dose 65+) 10/05/2019, 09/24/2020, 08/21/2022   Fluad Trivalent(High Dose 65+) 09/20/2023   Influenza Split 09/21/2011, 09/05/2012   Influenza Whole 10/07/2001, 09/23/2007, 09/06/2008, 10/10/2009   Influenza, High Dose Seasonal PF 09/15/2018   Influenza,inj,Quad PF,6+  Mos 10/09/2013, 09/06/2014, 08/14/2016   Influenza-Unspecified 09/14/2017, 09/19/2018, 10/06/2021   Moderna Covid-19 Fall Seasonal Vaccine 18yrs & older 10/19/2023   Moderna Sars-Covid-2 Vaccination 01/19/2020, 02/16/2020, 10/15/2020   PFIZER Comirnaty(Gray Top)Covid-19 Tri-Sucrose Vaccine 07/22/2021   Pfizer Covid-19 Vaccine Bivalent Booster 28yrs & up 02/04/2022   Pneumococcal Conjugate-13 12/11/2016   Pneumococcal Polysaccharide-23 12/30/2017   Td 02/04/1998, 09/23/2007, 09/30/2017   Zoster Recombinant(Shingrix) 04/20/2023, 08/03/2023   Zoster, Live 06/03/2012    TDAP status: Up to date  Flu Vaccine status: Up to date  Pneumococcal vaccine status: Up to date  Covid-19 vaccine status: Declined, Education has been provided regarding the importance of this vaccine but patient still declined. Advised may receive this vaccine at local pharmacy or Health Dept.or vaccine clinic. Aware to provide a copy of the vaccination record if obtained from local pharmacy or Health Dept. Verbalized acceptance and understanding.  Qualifies for Shingles Vaccine? Yes   Zostavax completed Yes   Shingrix Completed?: Yes  Screening Tests Health Maintenance  Topic Date  Due   Fecal DNA (Cologuard)  12/12/2023   COVID-19 Vaccine (7 - 2024-25 season) 12/14/2023   Medicare Annual Wellness (AWV)  12/19/2024   MAMMOGRAM  02/09/2025   DEXA SCAN  02/09/2025   DTaP/Tdap/Td (4 - Tdap) 10/01/2027   Pneumonia Vaccine 51+ Years old  Completed   INFLUENZA VACCINE  Completed   Zoster Vaccines- Shingrix  Completed   Hepatitis C Screening  Addressed   HPV VACCINES  Aged Out    Health Maintenance  Health Maintenance Due  Topic Date Due   Fecal DNA (Cologuard)  12/12/2023   COVID-19 Vaccine (7 - 2024-25 season) 12/14/2023    Colorectal cancer screening: Referral to GI placed Deferred. Pt aware the office will call re: appt.  Mammogram status: Completed 02/10/23. Repeat every year  Bone Density status:  Completed 02/10/23. Results reflect: Bone density results: OSTEOPOROSIS. Repeat every   years.     Additional Screening:  Hepatitis C Screening: does qualify; Deferred  Vision Screening: Recommended annual ophthalmology exams for early detection of glaucoma and other disorders of the eye. Is the patient up to date with their annual eye exam?  Yes  Who is the provider or what is the name of the office in which the patient attends annual eye exams? [facility] If pt is not established with a provider, would they like to be referred to a provider to establish care? No .   Dental Screening: Recommended annual dental exams for proper oral hygiene    Community Resource Referral / Chronic Care Management:  CRR required this visit?  No   CCM required this visit?  No     Plan:     I have personally reviewed and noted the following in the patient's chart:   Medical and social history Use of alcohol, tobacco or illicit drugs  Current medications and supplements including opioid prescriptions. Patient is not currently taking opioid prescriptions. Functional ability and status Nutritional status Physical activity Advanced directives List of other physicians Hospitalizations, surgeries, and ER visits in previous 12 months Vitals Screenings to include cognitive, depression, and falls Referrals and appointments  In addition, I have reviewed and discussed with patient certain preventive protocols, quality metrics, and best practice recommendations. A written personalized care plan for preventive services as well as general preventive health recommendations were provided to patient.     Rojelio LELON Blush, LPN   [id number]   After Visit Summary: (MyChart) Due to this being a telephonic visit, the after visit summary with patients personalized plan was offered to patient via MyChart   Nurse Notes: None

## 2023-12-20 NOTE — Patient Instructions (Addendum)
 Tracey Giles , Thank you for taking time to come for your Medicare Wellness Visit. I appreciate your ongoing commitment to your health goals. Please review the following plan we discussed and let me know if I can assist you in the future.   Referrals/Orders/Follow-Ups/Clinician Recommendations:   This is a list of the screening recommended for you and due dates:  Health Maintenance  Topic Date Due   Cologuard (Stool DNA test)  12/12/2023   COVID-19 Vaccine (7 - 2024-25 season) 12/14/2023   Medicare Annual Wellness Visit  12/19/2024   Mammogram  02/09/2025   DEXA scan (bone density measurement)  02/09/2025   DTaP/Tdap/Td vaccine (4 - Tdap) 10/01/2027   Pneumonia Vaccine  Completed   Flu Shot  Completed   Zoster (Shingles) Vaccine  Completed   Hepatitis C Screening  Addressed   HPV Vaccine  Aged Out    Advanced directives: (Declined) Advance directive discussed with you today. Even though you declined this today, please call our office should you change your mind, and we can give you the proper paperwork for you to fill out.  Next Medicare Annual Wellness Visit scheduled for next year: Yes

## 2024-01-05 DIAGNOSIS — D045 Carcinoma in situ of skin of trunk: Secondary | ICD-10-CM | POA: Diagnosis not present

## 2024-01-05 DIAGNOSIS — D492 Neoplasm of unspecified behavior of bone, soft tissue, and skin: Secondary | ICD-10-CM | POA: Diagnosis not present

## 2024-01-19 DIAGNOSIS — D225 Melanocytic nevi of trunk: Secondary | ICD-10-CM | POA: Diagnosis not present

## 2024-01-19 DIAGNOSIS — D2262 Melanocytic nevi of left upper limb, including shoulder: Secondary | ICD-10-CM | POA: Diagnosis not present

## 2024-01-19 DIAGNOSIS — D045 Carcinoma in situ of skin of trunk: Secondary | ICD-10-CM | POA: Diagnosis not present

## 2024-01-19 DIAGNOSIS — L814 Other melanin hyperpigmentation: Secondary | ICD-10-CM | POA: Diagnosis not present

## 2024-01-19 DIAGNOSIS — L821 Other seborrheic keratosis: Secondary | ICD-10-CM | POA: Diagnosis not present

## 2024-01-19 DIAGNOSIS — Z7189 Other specified counseling: Secondary | ICD-10-CM | POA: Diagnosis not present

## 2024-01-19 DIAGNOSIS — L720 Epidermal cyst: Secondary | ICD-10-CM | POA: Diagnosis not present

## 2024-01-20 ENCOUNTER — Encounter: Payer: Self-pay | Admitting: Family Medicine

## 2024-01-20 ENCOUNTER — Ambulatory Visit: Payer: Medicare PPO | Admitting: Family Medicine

## 2024-01-20 VITALS — BP 136/80 | HR 58 | Temp 97.7°F | Ht 68.0 in | Wt 140.3 lb

## 2024-01-20 DIAGNOSIS — E782 Mixed hyperlipidemia: Secondary | ICD-10-CM

## 2024-01-20 DIAGNOSIS — Z1211 Encounter for screening for malignant neoplasm of colon: Secondary | ICD-10-CM | POA: Diagnosis not present

## 2024-01-20 DIAGNOSIS — I10 Essential (primary) hypertension: Secondary | ICD-10-CM | POA: Diagnosis not present

## 2024-01-20 DIAGNOSIS — E039 Hypothyroidism, unspecified: Secondary | ICD-10-CM | POA: Diagnosis not present

## 2024-01-20 DIAGNOSIS — Z131 Encounter for screening for diabetes mellitus: Secondary | ICD-10-CM

## 2024-01-20 DIAGNOSIS — E559 Vitamin D deficiency, unspecified: Secondary | ICD-10-CM

## 2024-01-20 DIAGNOSIS — M810 Age-related osteoporosis without current pathological fracture: Secondary | ICD-10-CM

## 2024-01-20 LAB — TSH: TSH: 1.5 u[IU]/mL (ref 0.35–5.50)

## 2024-01-20 LAB — COMPREHENSIVE METABOLIC PANEL
ALT: 7 U/L (ref 0–35)
AST: 16 U/L (ref 0–37)
Albumin: 4.2 g/dL (ref 3.5–5.2)
Alkaline Phosphatase: 62 U/L (ref 39–117)
BUN: 18 mg/dL (ref 6–23)
CO2: 26 meq/L (ref 19–32)
Calcium: 9.1 mg/dL (ref 8.4–10.5)
Chloride: 105 meq/L (ref 96–112)
Creatinine, Ser: 0.76 mg/dL (ref 0.40–1.20)
GFR: 78.3 mL/min (ref >60.00)
Glucose, Bld: 99 mg/dL (ref 70–99)
Potassium: 4 meq/L (ref 3.5–5.1)
Sodium: 139 meq/L (ref 135–145)
Total Bilirubin: 0.5 mg/dL (ref 0.2–1.2)
Total Protein: 7.3 g/dL (ref 6.0–8.3)

## 2024-01-20 LAB — LIPID PANEL
Cholesterol: 121 mg/dL (ref 0–200)
HDL: 55.8 mg/dL (ref >39.00)
LDL Cholesterol: 46 mg/dL (ref 0–99)
NonHDL: 65.48
Total CHOL/HDL Ratio: 2
Triglycerides: 98 mg/dL (ref 0.0–149.0)
VLDL: 19.6 mg/dL (ref 0.0–40.0)

## 2024-01-20 LAB — VITAMIN D 25 HYDROXY (VIT D DEFICIENCY, FRACTURES): VITD: 31.53 ng/mL (ref 30.00–100.00)

## 2024-01-20 LAB — HEMOGLOBIN A1C: Hgb A1c MFr Bld: 5.8 % (ref 4.6–6.5)

## 2024-01-20 MED ORDER — ALENDRONATE SODIUM 70 MG PO TABS: 70.0000 mg | ORAL_TABLET | ORAL | 3 refills | Status: DC

## 2024-01-20 MED ORDER — METOPROLOL SUCCINATE ER 100 MG PO TB24: 100.0000 mg | ORAL_TABLET | Freq: Every day | ORAL | 1 refills | Status: DC

## 2024-01-20 MED ORDER — SYNTHROID 75 MCG PO TABS: 75.0000 ug | ORAL_TABLET | Freq: Every day | ORAL | 1 refills | Status: DC

## 2024-01-20 NOTE — Progress Notes (Signed)
Established Patient Office Visit  Subjective   Patient ID: Tracey Giles, female    DOB: Sep 10, 1951  Age: 73 y.o. MRN: [id number]  Chief Complaint  Patient presents with   Medical Management of Chronic Issues    Pt is here for 6 month follow up.   HTN -- BP in office performed and is well controlled. She  reports no side effects to the medications, no chest pain, SOB, dizziness or headaches. She has a BP cuff at home and is checking BP regularly, reports they are in the normal range.   Osteoporosis-- pt has been on the fosamax for 1 year, states that she is doing "ok" with the medication, states that she gets some "crunchiness" in her left jaw but no pain. States that she feels like it makes her feel "weird"   Hypothyroidism -- on synthroid 75 mcg daily, reports she is doing well on this dose, no new symptoms or issues to report.   Pt states she is going for MOHS surgery soon with her dermatologist due to finding a squamous cell CA on her chest.        Current Outpatient Medications  Medication Instructions   acetaminophen (TYLENOL) 1,000 mg, Every 6 hours PRN   albuterol (VENTOLIN HFA) 108 (90 Base) MCG/ACT inhaler 2 puffs, Inhalation, Every 6 hours PRN   alendronate (FOSAMAX) 70 mg, Oral, Every 7 days, Take with a full glass of water on an empty stomach.   amLODipine (NORVASC) 2.5 mg, Oral, Daily   aspirin EC 81 mg, Oral, Daily, Swallow whole.   atorvastatin (LIPITOR) 10 mg, Oral, Daily   B-12 1,000 mcg, Every morning   cholecalciferol (VITAMIN D3) 1,000 Units, Every morning   FOLIC ACID PO 1 tablet, Every morning   levocetirizine (XYZAL) 5 mg, Oral, Every evening   LORazepam (ATIVAN) 0.25-0.5 mg, Oral, Every 8 hours PRN   meloxicam (MOBIC) 15 mg, Oral, Daily   metoprolol succinate (TOPROL-XL) 100 mg, Oral, Daily, Take with or immediately following a meal.   omeprazole (PRILOSEC OTC) 20 mg, Daily PRN   Synthroid 75 mcg, Oral, Daily before breakfast    Patient  Active Problem List   Diagnosis Date Noted   DJD (degenerative joint disease) 07/22/2023   Intestinal gas excretion 08/27/2022   Essential hypertension    Non-ST elevation (NSTEMI) myocardial infarction (HCC) 12/14/2021   Chest pain at rest 12/12/2021   ACS (acute coronary syndrome) (HCC)    Abnormal reflex 10/18/2019   Numbness 10/18/2019   B12 deficiency 10/18/2019   Osteoporosis 01/04/2017   Exercise-induced asthma 11/20/2016   Upper airway cough syndrome 07/01/2016   Dyspnea 07/01/2016   Vitamin D insufficiency 05/24/2016   Hyperlipidemia 05/24/2016   Palpitations 11/15/2014   Meniere's disease 07/21/2007   GASTROESOPHAGEAL REFLUX DISEASE, SEVERE 07/21/2007   Hypothyroidism 07/08/2007   Allergic rhinitis 07/08/2007      Review of Systems  All other systems reviewed and are negative.     Objective:     BP 136/80   Pulse (!) 58   Temp 97.7 F (36.5 C) (Oral)   Ht 5\' 8"  (1.727 m)   Wt 140 lb 4.8 oz (63.6 kg)   SpO2 99%   BMI 21.33 kg/m    Physical Exam Vitals reviewed.  Constitutional:      Appearance: Normal appearance. She is well-groomed and normal weight.  Eyes:     Conjunctiva/sclera: Conjunctivae normal.  Neck:     Thyroid: No thyromegaly.  Cardiovascular:  Rate and Rhythm: Normal rate and regular rhythm.     Pulses: Normal pulses.     Heart sounds: S1 normal and S2 normal.  Pulmonary:     Effort: Pulmonary effort is normal.     Breath sounds: Normal breath sounds and air entry.  Abdominal:     General: Bowel sounds are normal.  Musculoskeletal:     Right lower leg: No edema.     Left lower leg: No edema.  Neurological:     Mental Status: She is alert and oriented to person, place, and time. Mental status is at baseline.     Gait: Gait is intact.  Psychiatric:        Mood and Affect: Mood and affect normal.        Speech: Speech normal.        Behavior: Behavior normal.        Judgment: Judgment normal.       The ASCVD Risk score  (Arnett DK, et al., 2019) failed to calculate for the following reasons:   Risk score cannot be calculated because patient has a medical history suggesting prior/existing ASCVD    Assessment & Plan:  Hypothyroidism, unspecified type Assessment & Plan: On 75 mcg daily, appears stable on this dose, will order new TSH for surveillance  Orders: -     TSH; Future -     Synthroid; Take 1 tablet (75 mcg total) by mouth daily before breakfast.  Dispense: 90 tablet; Refill: 1  Vitamin D insufficiency Assessment & Plan: Needs new level checked today, orders placed  Orders: -     VITAMIN D 25 Hydroxy (Vit-D Deficiency, Fractures); Future  Essential hypertension -     Comprehensive metabolic panel; Future -     Metoprolol Succinate ER; Take 1 tablet (100 mg total) by mouth daily. Take with or immediately following a meal.  Dispense: 90 tablet; Refill: 1  Mixed hyperlipidemia -     Lipid panel; Future  Colon cancer screening -     Cologuard  Screening for diabetes mellitus -     Hemoglobin A1c; Future  Osteoporosis, unspecified osteoporosis type, unspecified pathological fracture presence Assessment & Plan: Currently on foasamax, there is no jaw pain today, we discussed switching her to a different medication, however she wants to continue the fosamax for now, will refill medications  Orders: -     Alendronate Sodium; Take 1 tablet (70 mg total) by mouth every 7 (seven) days. Take with a full glass of water on an empty stomach.  Dispense: 12 tablet; Refill: 3     Return in about 3 months (around 04/18/2024).    Karie Georges, MD

## 2024-01-21 ENCOUNTER — Encounter: Payer: Self-pay | Admitting: Family Medicine

## 2024-01-24 NOTE — Assessment & Plan Note (Signed)
Needs new level checked today, orders placed

## 2024-01-24 NOTE — Assessment & Plan Note (Signed)
Currently on foasamax, there is no jaw pain today, we discussed switching her to a different medication, however she wants to continue the fosamax for now, will refill medications

## 2024-01-24 NOTE — Assessment & Plan Note (Signed)
On 75 mcg daily, appears stable on this dose, will order new TSH for surveillance

## 2024-01-27 DIAGNOSIS — Z1211 Encounter for screening for malignant neoplasm of colon: Secondary | ICD-10-CM | POA: Diagnosis not present

## 2024-02-01 LAB — COLOGUARD: COLOGUARD: NEGATIVE

## 2024-02-02 ENCOUNTER — Other Ambulatory Visit: Payer: Self-pay | Admitting: Cardiology

## 2024-02-08 DIAGNOSIS — D045 Carcinoma in situ of skin of trunk: Secondary | ICD-10-CM | POA: Diagnosis not present

## 2024-04-13 ENCOUNTER — Encounter (HOSPITAL_COMMUNITY): Payer: Self-pay

## 2024-04-18 ENCOUNTER — Ambulatory Visit: Payer: Medicare PPO | Admitting: Family Medicine

## 2024-04-20 ENCOUNTER — Ambulatory Visit: Admitting: Family Medicine

## 2024-04-20 ENCOUNTER — Encounter: Payer: Self-pay | Admitting: Family Medicine

## 2024-04-20 VITALS — BP 130/72 | HR 58 | Temp 98.1°F | Ht 68.0 in | Wt 140.6 lb

## 2024-04-20 DIAGNOSIS — J45990 Exercise induced bronchospasm: Secondary | ICD-10-CM | POA: Diagnosis not present

## 2024-04-20 DIAGNOSIS — G4733 Obstructive sleep apnea (adult) (pediatric): Secondary | ICD-10-CM | POA: Diagnosis not present

## 2024-04-20 DIAGNOSIS — Z0000 Encounter for general adult medical examination without abnormal findings: Secondary | ICD-10-CM

## 2024-04-20 MED ORDER — ALBUTEROL SULFATE HFA 108 (90 BASE) MCG/ACT IN AERS: 2.0000 | INHALATION_SPRAY | Freq: Four times a day (QID) | RESPIRATORY_TRACT | 1 refills | Status: AC | PRN

## 2024-04-20 NOTE — Progress Notes (Signed)
 Complete physical exam  Patient: Tracey Giles   DOB: Jul 31, 1951   73 y.o. Female  MRN: [id number]  Subjective:     Chief Complaint  Patient presents with   Medical Management of Chronic Issues    Tracey Giles is a 73 y.o. female who presents today for a complete physical exam. She reports consuming a general diet. Home exercise routine includes walking 1.5 hrs per week. She generally feels well. She reports sleeping sometimes has difficulty falling asleep, states she was diagnosed with mild OSA about 20 years ago, she repeated her testing a few years ago, could not tolerate CPAP machine. She does not have additional problems to discuss today.    Most recent fall risk assessment:    12/20/2023    3:15 PM  Fall Risk   Falls in the past year? 1  Number falls in past yr: 0  Injury with Fall? 1  Comment Hit head. Followed by medical attention  Risk for fall due to : No Fall Risks  Follow up Falls prevention discussed     Most recent depression screenings:    12/20/2023    3:14 PM 07/22/2023    2:22 PM  PHQ 2/9 Scores  PHQ - 2 Score 0 0  PHQ- 9 Score  0    Vision:Within last year and Dental: No current dental problems and Receives regular dental care  Patient Active Problem List   Diagnosis Date Noted   OSA (obstructive sleep apnea) 04/20/2024   DJD (degenerative joint disease) 07/22/2023   Intestinal gas excretion 08/27/2022   Essential hypertension    Non-ST elevation (NSTEMI) myocardial infarction (HCC) 12/14/2021   Chest pain at rest 12/12/2021   ACS (acute coronary syndrome) (HCC)    Abnormal reflex 10/18/2019   Numbness 10/18/2019   B12 deficiency 10/18/2019   Osteoporosis 01/04/2017   Exercise-induced asthma 11/20/2016   Upper airway cough syndrome 07/01/2016   Dyspnea 07/01/2016   Vitamin D  insufficiency 05/24/2016   Hyperlipidemia 05/24/2016   Palpitations 11/15/2014   Meniere's disease 07/21/2007   GASTROESOPHAGEAL REFLUX DISEASE,  SEVERE 07/21/2007   Hypothyroidism 07/08/2007   Allergic rhinitis 07/08/2007      Patient Care Team: Aida House, MD as PCP - General (Family Medicine) Gerard Knight, MD as PCP - Cardiology (Cardiology) Ryland Cozier, MD as Consulting Physician (Otolaryngology) Eduardo Grade, MD as Consulting Physician (Dermatology) Wanita Gutta, MD as Consulting Physician (Obstetrics and Gynecology)   Outpatient Medications Prior to Visit  Medication Sig   acetaminophen  (TYLENOL ) 500 MG tablet Take 1,000 mg by mouth every 6 (six) hours as needed (pain).   alendronate  (FOSAMAX ) 70 MG tablet Take 1 tablet (70 mg total) by mouth every 7 (seven) days. Take with a full glass of water on an empty stomach.   amLODipine  (NORVASC ) 2.5 MG tablet Take 1 tablet by mouth once daily   aspirin  EC 81 MG EC tablet Take 1 tablet (81 mg total) by mouth daily. Swallow whole.   atorvastatin  (LIPITOR) 10 MG tablet Take 1 tablet by mouth once daily   cholecalciferol (VITAMIN D ) 25 MCG (1000 UNIT) tablet Take 1,000 Units by mouth every morning.   Cyanocobalamin  (B-12) 1000 MCG TABS Take 1,000 mcg by mouth every morning.   FOLIC ACID  PO Take 1 tablet by mouth every morning.   levocetirizine (XYZAL ) 5 MG tablet Take 1 tablet (5 mg total) by mouth every evening.   LORazepam  (ATIVAN ) 0.5 MG tablet Take 0.5-1 tablets (0.25-0.5 mg total) by  mouth every 8 (eight) hours as needed for anxiety.   meloxicam  (MOBIC ) 15 MG tablet Take 1 tablet (15 mg total) by mouth daily.   metoprolol  succinate (TOPROL -XL) 100 MG 24 hr tablet Take 1 tablet (100 mg total) by mouth daily. Take with or immediately following a meal.   omeprazole  (PRILOSEC OTC) 20 MG tablet Take 20 mg by mouth daily as needed (acid reflux/heartburn).   SYNTHROID  75 MCG tablet Take 1 tablet (75 mcg total) by mouth daily before breakfast.   [DISCONTINUED] albuterol  (VENTOLIN  HFA) 108 (90 Base) MCG/ACT inhaler Inhale 2 puffs into the lungs every 6 (six)  hours as needed. (Patient taking differently: Inhale 2 puffs into the lungs every 6 (six) hours as needed for wheezing or shortness of breath.)   No facility-administered medications prior to visit.    Review of Systems  HENT:  Negative for hearing loss.   Eyes:  Negative for blurred vision.  Respiratory:  Negative for shortness of breath.   Cardiovascular:  Negative for chest pain.  Gastrointestinal: Negative.   Genitourinary: Negative.   Musculoskeletal:  Negative for back pain.  Neurological:  Negative for headaches.  Psychiatric/Behavioral:  Negative for depression.        Objective:     BP 130/72   Pulse (!) 58   Temp 98.1 F (36.7 C) (Oral)   Ht 5\' 8"  (1.727 m)   Wt 140 lb 9.6 oz (63.8 kg)   SpO2 97%   BMI 21.38 kg/m    Physical Exam Vitals reviewed.  Constitutional:      Appearance: Normal appearance. She is well-groomed and normal weight.  HENT:     Right Ear: Tympanic membrane and ear canal normal.     Left Ear: Tympanic membrane and ear canal normal.     Mouth/Throat:     Mouth: Mucous membranes are moist.     Pharynx: No posterior oropharyngeal erythema.  Eyes:     Conjunctiva/sclera: Conjunctivae normal.  Neck:     Thyroid : No thyromegaly.  Cardiovascular:     Rate and Rhythm: Normal rate and regular rhythm.     Pulses: Normal pulses.     Heart sounds: S1 normal and S2 normal.  Pulmonary:     Effort: Pulmonary effort is normal.     Breath sounds: Normal breath sounds and air entry.  Abdominal:     General: Abdomen is flat. Bowel sounds are normal.     Palpations: Abdomen is soft.  Musculoskeletal:     Right lower leg: No edema.     Left lower leg: No edema.  Lymphadenopathy:     Cervical: No cervical adenopathy.  Neurological:     Mental Status: She is alert and oriented to person, place, and time. Mental status is at baseline.     Gait: Gait is intact.  Psychiatric:        Mood and Affect: Mood and affect normal.        Speech: Speech  normal.        Behavior: Behavior normal.        Judgment: Judgment normal.      No results found for any visits on 04/20/24.     Assessment & Plan:    Routine Health Maintenance and Physical Exam  Immunization History  Administered Date(s) Administered   Fluad Quad(high Dose 65+) 10/05/2019, 09/24/2020, 08/21/2022   Fluad Trivalent(High Dose 65+) 09/20/2023   Influenza Split 09/21/2011, 09/05/2012   Influenza Whole 10/07/2001, 09/23/2007, 09/06/2008, 10/10/2009   Influenza, High  Dose Seasonal PF 09/15/2018   Influenza,inj,Quad PF,6+ Mos 10/09/2013, 09/06/2014, 08/14/2016   Influenza-Unspecified 09/14/2017, 09/19/2018, 10/06/2021   Moderna Covid-19 Fall Seasonal Vaccine 40yrs & older 10/19/2023   Moderna Sars-Covid-2 Vaccination 01/19/2020, 02/16/2020, 10/15/2020   PFIZER Comirnaty(Gray Top)Covid-19 Tri-Sucrose Vaccine 07/22/2021   Pfizer Covid-19 Vaccine Bivalent Booster 38yrs & up 02/04/2022   Pneumococcal Conjugate-13 12/11/2016   Pneumococcal Polysaccharide-23 12/30/2017   Td 02/04/1998, 09/23/2007, 09/30/2017   Zoster Recombinant(Shingrix) 04/20/2023, 08/03/2023   Zoster, Live 06/03/2012    Health Maintenance  Topic Date Due   COVID-19 Vaccine (7 - Moderna risk 2024-25 season) 04/17/2024   INFLUENZA VACCINE  07/07/2024   Medicare Annual Wellness (AWV)  12/19/2024   MAMMOGRAM  02/09/2025   DEXA SCAN  02/09/2025   Fecal DNA (Cologuard)  01/26/2027   DTaP/Tdap/Td (4 - Tdap) 10/01/2027   Pneumonia Vaccine 60+ Years old  Completed   Zoster Vaccines- Shingrix  Completed   Hepatitis C Screening  Addressed   HPV VACCINES  Aged Out   Meningococcal B Vaccine  Aged Out    Discussed health benefits of physical activity, and encouraged her to engage in regular exercise appropriate for her age and condition.  Routine general medical examination at a health care facility Normal physical exam findings today. Counseled patient on increasing exercise to 5 days per week and  healthy sleep habits. Handouts given on healthy eating and exercise. She is UTD on her health maintenance. Counseled patient on the RSV vaccination as well. Labs reviewed from the last visit and are WNL.  Exercise-induced asthma -     Albuterol  Sulfate HFA; Inhale 2 puffs into the lungs every 6 (six) hours as needed.  Dispense: 8.5 g; Refill: 1  OSA (obstructive sleep apnea) Assessment & Plan: Moderate on the sleep study in 2022, pt could not tolerate the CPAP machine. I counseled the patient on mouth appliances vs. The inspire device. I also counseled the patient on healthy sleep habits and advised her to speak with her dentist about the mouth appliance.      Return in about 6 months (around 10/21/2024).     Aida House, MD

## 2024-04-20 NOTE — Assessment & Plan Note (Signed)
 Moderate on the sleep study in 2022, pt could not tolerate the CPAP machine. I counseled the patient on mouth appliances vs. The inspire device. I also counseled the patient on healthy sleep habits and advised her to speak with her dentist about the mouth appliance.

## 2024-04-20 NOTE — Patient Instructions (Addendum)
 Ask your dentist about getting a mouth appliance to treat your obstructive sleep apnea.   RSV vaccination is due, may go to any pharmacy in the area to have this done.

## 2024-04-28 ENCOUNTER — Other Ambulatory Visit: Payer: Self-pay | Admitting: Cardiology

## 2024-05-23 ENCOUNTER — Encounter: Payer: Self-pay | Admitting: Cardiology

## 2024-05-23 ENCOUNTER — Ambulatory Visit: Payer: Medicare PPO | Attending: Cardiology | Admitting: Cardiology

## 2024-05-23 VITALS — BP 120/76 | HR 64 | Ht 68.0 in | Wt 142.0 lb

## 2024-05-23 DIAGNOSIS — E782 Mixed hyperlipidemia: Secondary | ICD-10-CM | POA: Diagnosis not present

## 2024-05-23 DIAGNOSIS — I10 Essential (primary) hypertension: Secondary | ICD-10-CM

## 2024-05-23 DIAGNOSIS — I249 Acute ischemic heart disease, unspecified: Secondary | ICD-10-CM | POA: Diagnosis not present

## 2024-05-23 DIAGNOSIS — R002 Palpitations: Secondary | ICD-10-CM | POA: Diagnosis not present

## 2024-05-23 DIAGNOSIS — I201 Angina pectoris with documented spasm: Secondary | ICD-10-CM | POA: Diagnosis not present

## 2024-05-23 NOTE — Patient Instructions (Signed)
 Medication Instructions:  Your physician recommends that you continue on your current medications as directed. Please refer to the Current Medication list given to you today.   Labwork: None today  Testing/Procedures: None today  Follow-Up: 1 year  Any Other Special Instructions Will Be Listed Below (If Applicable).  If you need a refill on your cardiac medications before your next appointment, please call your pharmacy.

## 2024-05-23 NOTE — Progress Notes (Signed)
    Cardiology Office Note  Date: 05/23/2024   ID: Tracey Giles, DOB 02/18/1951, MRN [id number]  History of Present Illness: Tracey Giles is a 73 y.o. female last seen in December 2024.  She is here for a follow-up visit.  Reports no recurring chest pain or sense of palpitations.  No specific change in stamina.  She has had trouble with seasonal allergies this year.  We went over her medications.  No changes from a cardiac perspective.  I went over her interval lab work which is noted below.  LDL was well-controlled at 46.  I reviewed her ECG today which shows normal sinus rhythm.  Physical Exam: VS:  BP 120/76 (BP Location: Right Arm, Cuff Size: Normal)   Pulse 64   Ht 5' 8 (1.727 m)   Wt 142 lb (64.4 kg)   SpO2 98%   BMI 21.59 kg/m , BMI Body mass index is 21.59 kg/m.  Wt Readings from Last 3 Encounters:  05/23/24 142 lb (64.4 kg)  04/20/24 140 lb 9.6 oz (63.8 kg)  01/20/24 140 lb 4.8 oz (63.6 kg)    General: Patient appears comfortable at rest. HEENT: Conjunctiva and lids normal. Neck: Supple, no elevated JVP or carotid bruits. Lungs: Clear to auscultation, nonlabored breathing at rest. Cardiac: Regular rate and rhythm, no S3 or significant systolic murmur.  ECG:  An ECG dated 05/13/2023 was personally reviewed today and demonstrated:  Sinus bradycardia.  Labwork: 01/20/2024: ALT 7; AST 16; BUN 18; Creatinine, Ser 0.76; Potassium 4.0; Sodium 139; TSH 1.50     Component Value Date/Time   CHOL 121 01/20/2024 1408   TRIG 98.0 01/20/2024 1408   HDL 55.80 01/20/2024 1408   CHOLHDL 2 01/20/2024 1408   VLDL 19.6 01/20/2024 1408   LDLCALC 46 01/20/2024 1408   Other Studies Reviewed Today:  No interval cardiac testing for review today.  Assessment and Plan:  1.  History of NSTEMI in January 2023 felt to be most likely associated with coronary vasospasm with cardiac catheterization demonstrating only minimal coronary luminal irregularities.  LVEF greater  than 65% without regional wall motion abnormalities.  She reports no recurring symptoms.  Plan to continue observation on medical therapy including aspirin  81 mg daily, Norvasc  2.5 mg daily, and Lipitor 10 mg daily.   2.  Palpitations.  Well-controlled on Toprol -XL 100 mg daily.  ECG is normal today.   3.  Mixed hyperlipidemia.  LDL 46 in February.  Continue Lipitor 10 mg daily.  Disposition:  Follow up 1 year.  Signed, Gerard Knight, M.D., F.A.C.C. [facility]

## 2024-07-19 ENCOUNTER — Other Ambulatory Visit: Payer: Self-pay | Admitting: Family Medicine

## 2024-07-19 DIAGNOSIS — I10 Essential (primary) hypertension: Secondary | ICD-10-CM

## 2024-07-19 DIAGNOSIS — E039 Hypothyroidism, unspecified: Secondary | ICD-10-CM

## 2024-07-20 DIAGNOSIS — L821 Other seborrheic keratosis: Secondary | ICD-10-CM | POA: Diagnosis not present

## 2024-07-20 DIAGNOSIS — Z85828 Personal history of other malignant neoplasm of skin: Secondary | ICD-10-CM | POA: Diagnosis not present

## 2024-07-20 DIAGNOSIS — L814 Other melanin hyperpigmentation: Secondary | ICD-10-CM | POA: Diagnosis not present

## 2024-07-20 DIAGNOSIS — Z08 Encounter for follow-up examination after completed treatment for malignant neoplasm: Secondary | ICD-10-CM | POA: Diagnosis not present

## 2024-07-20 DIAGNOSIS — D225 Melanocytic nevi of trunk: Secondary | ICD-10-CM | POA: Diagnosis not present

## 2024-07-21 DIAGNOSIS — H5203 Hypermetropia, bilateral: Secondary | ICD-10-CM | POA: Diagnosis not present

## 2024-07-21 DIAGNOSIS — H2513 Age-related nuclear cataract, bilateral: Secondary | ICD-10-CM | POA: Diagnosis not present

## 2024-07-27 DIAGNOSIS — Z1231 Encounter for screening mammogram for malignant neoplasm of breast: Secondary | ICD-10-CM | POA: Diagnosis not present

## 2024-07-27 LAB — HM MAMMOGRAPHY

## 2024-07-31 ENCOUNTER — Ambulatory Visit: Payer: Self-pay | Admitting: Family Medicine

## 2024-08-15 ENCOUNTER — Encounter: Payer: Self-pay | Admitting: Family Medicine

## 2024-08-15 ENCOUNTER — Ambulatory Visit (INDEPENDENT_AMBULATORY_CARE_PROVIDER_SITE_OTHER)
Admission: RE | Admit: 2024-08-15 | Discharge: 2024-08-15 | Disposition: A | Discharge status: Home / Self Care | Source: Ambulatory Visit | Attending: Family Medicine | Admitting: Family Medicine

## 2024-08-15 ENCOUNTER — Ambulatory Visit: Admitting: Family Medicine

## 2024-08-15 VITALS — BP 120/80 | HR 65 | Temp 97.7°F | Ht 68.0 in | Wt 148.2 lb

## 2024-08-15 DIAGNOSIS — R102 Pelvic and perineal pain: Secondary | ICD-10-CM

## 2024-08-15 DIAGNOSIS — H00014 Hordeolum externum left upper eyelid: Secondary | ICD-10-CM | POA: Diagnosis not present

## 2024-08-15 DIAGNOSIS — K5900 Constipation, unspecified: Secondary | ICD-10-CM | POA: Diagnosis not present

## 2024-08-15 DIAGNOSIS — R350 Frequency of micturition: Secondary | ICD-10-CM | POA: Diagnosis not present

## 2024-08-15 LAB — POC URINALSYSI DIPSTICK (AUTOMATED)
Bilirubin, UA: NEGATIVE
Glucose, UA: NEGATIVE
Ketones, UA: NEGATIVE
Leukocytes, UA: NEGATIVE
Nitrite, UA: NEGATIVE
Protein, UA: NEGATIVE
Spec Grav, UA: 1.025 (ref 1.010–1.025)
Urobilinogen, UA: 0.2 U/dL
pH, UA: 6 (ref 5.0–8.0)

## 2024-08-15 NOTE — Progress Notes (Signed)
 Established Patient Office Visit  Subjective   Patient ID: Tracey Giles, female    DOB: 1951-11-17  Age: 73 y.o. MRN: [id number]  Chief Complaint  Patient presents with   Pelvic Pain    X1 week, denies discharge and has noticed increased urinary frequency   Eye Problem    Patient complains of left eye questionable stye noted since this morning    Pelvic Pain The patient's primary symptoms include pelvic pain.  Eye Problem   Discussed the use of AI scribe software for clinical note transcription with the patient, who gave verbal consent to proceed.  History of Present Illness   Tracey Giles is a 73 year old female who presents with urinary symptoms and abdominal pressure.  She has been experiencing a sensation of bloating and pressure in her abdomen for about a week. The pressure is not associated with sharp pain but is followed by a feeling of strain extending from the abdomen to the rectum.  There is an increase in urinary frequency and urgency, with a need to urinate more often and producing more urine than usual. No burning sensation during urination, fevers, or chills. She wakes up at night to urinate.  She experiences occasional stiffness in her back and intermittent leg weakness. Bowel movements are mostly regular, occurring daily, although she sometimes skips a day. She does not always feel completely empty after bowel movements and occasionally needs to strain. Stools are sometimes hard.  She also reports waking up with a sty in her left eye, which is red and swollen but not affecting her vision. She has started using hot compresses to alleviate the symptoms.       Current Outpatient Medications  Medication Instructions   acetaminophen  (TYLENOL ) 1,000 mg, Every 6 hours PRN   albuterol  (VENTOLIN  HFA) 108 (90 Base) MCG/ACT inhaler 2 puffs, Inhalation, Every 6 hours PRN   alendronate  (FOSAMAX ) 70 mg, Oral, Every 7 days, Take with a full glass of water  on an empty stomach.   amLODipine  (NORVASC ) 2.5 mg, Oral, Daily   aspirin  EC 81 mg, Oral, Daily, Swallow whole.   atorvastatin  (LIPITOR) 10 mg, Oral, Daily   B-12 1,000 mcg, Every morning   cholecalciferol (VITAMIN D3) 1,000 Units, Every morning   FOLIC ACID  PO 1 tablet, Every morning   levocetirizine (XYZAL ) 5 mg, Oral, Every evening   LORazepam  (ATIVAN ) 0.25-0.5 mg, Oral, Every 8 hours PRN   meloxicam  (MOBIC ) 15 mg, Oral, Daily   metoprolol  succinate (TOPROL -XL) 100 MG 24 hr tablet TAKE 1 TABLET BY MOUTH ONCE DAILY. TAKE WITH OR IMMEDIATELY FOLLOWING A MEAL   omeprazole  (PRILOSEC OTC) 20 mg, Daily PRN   Synthroid  75 mcg, Oral, Daily before breakfast    Patient Active Problem List   Diagnosis Date Noted   OSA (obstructive sleep apnea) 04/20/2024   DJD (degenerative joint disease) 07/22/2023   Intestinal gas excretion 08/27/2022   Essential hypertension    Non-ST elevation (NSTEMI) myocardial infarction (HCC) 12/14/2021   Chest pain at rest 12/12/2021   ACS (acute coronary syndrome) (HCC)    Abnormal reflex 10/18/2019   Numbness 10/18/2019   B12 deficiency 10/18/2019   Osteoporosis 01/04/2017   Exercise-induced asthma 11/20/2016   Upper airway cough syndrome 07/01/2016   Dyspnea 07/01/2016   Vitamin D  insufficiency 05/24/2016   Hyperlipidemia 05/24/2016   Palpitations 11/15/2014   Meniere's disease 07/21/2007   GASTROESOPHAGEAL REFLUX DISEASE, SEVERE 07/21/2007   Hypothyroidism 07/08/2007   Allergic rhinitis 07/08/2007  Review of Systems  Genitourinary:  Positive for pelvic pain.      Objective:     BP 120/80   Pulse 65   Temp 97.7 F (36.5 C) (Oral)   Ht 5' 8 (1.727 m)   Wt 148 lb 3.2 oz (67.2 kg)   SpO2 100%   BMI 22.53 kg/m    Physical Exam Vitals reviewed.  Constitutional:      Appearance: Normal appearance. She is normal weight.  Eyes:     General:        Left eye: Hordeolum (upper eyelid with surrounding erythema and edema)  present. Pulmonary:     Effort: Pulmonary effort is normal.  Abdominal:     General: Abdomen is flat. Bowel sounds are normal. There is no distension.     Palpations: Abdomen is soft.     Tenderness: There is no abdominal tenderness. There is no right CVA tenderness, left CVA tenderness, guarding or rebound.  Neurological:     Mental Status: She is alert and oriented to person, place, and time. Mental status is at baseline.      Results for orders placed or performed in visit on 08/15/24  POCT Urinalysis Dipstick (Automated)  Result Value Ref Range   Color, UA yellow    Clarity, UA clear    Glucose, UA Negative Negative   Bilirubin, UA negative    Ketones, UA negative    Spec Grav, UA 1.025 1.010 - 1.025   Blood, UA trace    pH, UA 6.0 5.0 - 8.0   Protein, UA Negative Negative   Urobilinogen, UA 0.2 0.2 or 1.0 E.U./dL   Nitrite, UA negative    Leukocytes, UA Negative Negative      The ASCVD Risk score (Arnett DK, et al., 2019) failed to calculate for the following reasons:   Risk score cannot be calculated because patient has a medical history suggesting prior/existing ASCVD    Assessment & Plan:  Urinary frequency -     POCT Urinalysis Dipstick (Automated) -     Urine Culture; Future  Hordeolum externum of left upper eyelid  Pelvic pain -     DG Abd 2 Views; Future    Assessment and Plan    Pelvic and perineal pressure with increased urinary frequency and bloating Differential includes UTI, pelvic floor weakness, and constipation. No strong UTI indicators except for trace blood in the UA. Possible pelvic floor weakness or incomplete bowel evacuation. - Reviewed UA findings with patient, trace blood only found, will send for urine culture to confirm no UTI. Will also check abdominal film to look at the bowel gas pattern to rule out constipation/ large stool burden. - Provide pelvic floor exercises handouts. - Recommend prune juice or Miralax if urinalysis is  normal and no infection signs.  Left eye hordeolum (sty) Acute sty in left eye, red and swollen. No visual impairment. Sty nearing resolution. - Continue warm compresses 20 minutes on, 20 minutes off. - Monitor for resolution; consider antibiotics if no improvement.         No follow-ups on file.    Heron CHRISTELLA Sharper, MD

## 2024-08-16 LAB — URINE CULTURE
MICRO NUMBER:: 16942757
Result:: NO GROWTH
SPECIMEN QUALITY:: ADEQUATE

## 2024-08-17 ENCOUNTER — Ambulatory Visit: Payer: Self-pay | Admitting: Family Medicine

## 2024-08-24 DIAGNOSIS — H00014 Hordeolum externum left upper eyelid: Secondary | ICD-10-CM | POA: Diagnosis not present

## 2024-10-12 ENCOUNTER — Other Ambulatory Visit: Payer: Self-pay | Admitting: Family Medicine

## 2024-10-12 DIAGNOSIS — E039 Hypothyroidism, unspecified: Secondary | ICD-10-CM

## 2024-10-12 DIAGNOSIS — I10 Essential (primary) hypertension: Secondary | ICD-10-CM

## 2024-10-23 ENCOUNTER — Ambulatory Visit: Admitting: Family Medicine

## 2024-10-23 ENCOUNTER — Encounter: Payer: Self-pay | Admitting: Family Medicine

## 2024-10-23 VITALS — BP 128/86 | HR 60 | Temp 98.2°F | Ht 68.0 in | Wt 150.6 lb

## 2024-10-23 DIAGNOSIS — I10 Essential (primary) hypertension: Secondary | ICD-10-CM | POA: Diagnosis not present

## 2024-10-23 DIAGNOSIS — Z131 Encounter for screening for diabetes mellitus: Secondary | ICD-10-CM | POA: Diagnosis not present

## 2024-10-23 DIAGNOSIS — E782 Mixed hyperlipidemia: Secondary | ICD-10-CM | POA: Diagnosis not present

## 2024-10-23 DIAGNOSIS — E039 Hypothyroidism, unspecified: Secondary | ICD-10-CM

## 2024-10-23 LAB — TSH: TSH: 2.15 u[IU]/mL (ref 0.35–5.50)

## 2024-10-23 NOTE — Progress Notes (Signed)
 Established Patient Office Visit  Subjective   Patient ID: RIDDHI GRETHER, female    DOB: 1951/03/15  Age: 73 y.o. MRN: [id number]  Chief Complaint  Patient presents with   Medical Management of Chronic Issues    HPI Discussed the use of AI scribe software for clinical note transcription with the patient, who gave verbal consent to proceed.  History of Present Illness   Tracey Giles is a 74 year old female who presents for a six-month follow-up visit.  Urinary frequency is occasional and has improved since September. Bowel movements are now regular.  A 'swishing' sound in her ear coincides with her heartbeat. It is intermittent and more noticeable at night when lying down.  She experienced recent ankle swelling, attributed to excessive salt intake, which has mostly resolved. She continues to use over-the-counter Prilosec daily for stomach acid management. No new symptoms or problems aside from the ear swishing and ankle swelling.       Current Outpatient Medications  Medication Instructions   acetaminophen  (TYLENOL ) 1,000 mg, Every 6 hours PRN   albuterol  (VENTOLIN  HFA) 108 (90 Base) MCG/ACT inhaler 2 puffs, Inhalation, Every 6 hours PRN   alendronate  (FOSAMAX ) 70 mg, Oral, Every 7 days, Take with a full glass of water on an empty stomach.   amLODipine  (NORVASC ) 2.5 mg, Oral, Daily   aspirin  EC 81 mg, Oral, Daily, Swallow whole.   atorvastatin  (LIPITOR) 10 mg, Oral, Daily   B-12 1,000 mcg, Every morning   cholecalciferol (VITAMIN D3) 1,000 Units, Every morning   FOLIC ACID  PO 1 tablet, Every morning   levocetirizine (XYZAL ) 5 mg, Oral, Every evening   LORazepam  (ATIVAN ) 0.25-0.5 mg, Oral, Every 8 hours PRN   meloxicam  (MOBIC ) 15 mg, Oral, Daily   metoprolol  succinate (TOPROL -XL) 100 MG 24 hr tablet TAKE 1 TABLET BY MOUTH ONCE DAILY. TAKE WITH OR IMMEDIATELY FOLLOWING A MEAL   omeprazole  (PRILOSEC OTC) 20 mg, Daily PRN   Synthroid  75 mcg, Oral, Daily before  breakfast    Patient Active Problem List   Diagnosis Date Noted   OSA (obstructive sleep apnea) 04/20/2024   DJD (degenerative joint disease) 07/22/2023   Intestinal gas excretion 08/27/2022   Essential hypertension    Non-ST elevation (NSTEMI) myocardial infarction (HCC) 12/14/2021   Chest pain at rest 12/12/2021   ACS (acute coronary syndrome) (HCC)    Abnormal reflex 10/18/2019   Numbness 10/18/2019   B12 deficiency 10/18/2019   Osteoporosis 01/04/2017   Exercise-induced asthma 11/20/2016   Upper airway cough syndrome 07/01/2016   Dyspnea 07/01/2016   Vitamin D  insufficiency 05/24/2016   Hyperlipidemia 05/24/2016   Palpitations 11/15/2014   Meniere's disease 07/21/2007   GASTROESOPHAGEAL REFLUX DISEASE, SEVERE 07/21/2007   Hypothyroidism 07/08/2007   Allergic rhinitis 07/08/2007     Review of Systems  All other systems reviewed and are negative.     Objective:     BP 128/86   Pulse 60   Temp 98.2 F (36.8 C) (Oral)   Ht 5' 8 (1.727 m)   Wt 150 lb 9.6 oz (68.3 kg)   SpO2 98%   BMI 22.90 kg/m    Physical Exam Vitals reviewed.  Constitutional:      Appearance: Normal appearance. She is well-groomed and normal weight.  Eyes:     Conjunctiva/sclera: Conjunctivae normal.  Neck:     Thyroid : No thyromegaly.  Cardiovascular:     Rate and Rhythm: Normal rate and regular rhythm.     Pulses: Normal pulses.  Heart sounds: S1 normal and S2 normal.  Pulmonary:     Effort: Pulmonary effort is normal.     Breath sounds: Normal breath sounds and air entry.  Abdominal:     General: Bowel sounds are normal.  Musculoskeletal:     Right lower leg: No edema.     Left lower leg: No edema.  Neurological:     Mental Status: She is alert and oriented to person, place, and time. Mental status is at baseline.     Gait: Gait is intact.  Psychiatric:        Mood and Affect: Mood and affect normal.        Speech: Speech normal.        Behavior: Behavior normal.         Judgment: Judgment normal.      No results found for any visits on 10/23/24.    The ASCVD Risk score (Arnett DK, et al., 2019) failed to calculate for the following reasons:   Risk score cannot be calculated because patient has a medical history suggesting prior/existing ASCVD    Assessment & Plan:  Essential hypertension Assessment & Plan: Current hypertension medications:       Sig   amLODipine  (NORVASC ) 2.5 MG tablet (Taking) Take 1 tablet by mouth once daily   metoprolol  succinate (TOPROL -XL) 100 MG 24 hr tablet (Taking) TAKE 1 TABLET BY MOUTH ONCE DAILY. TAKE WITH OR IMMEDIATELY FOLLOWING A MEAL        Orders: -     Comprehensive metabolic panel with GFR; Future  Mixed hyperlipidemia -     Lipid panel; Future  Hypothyroidism, unspecified type -     TSH; Future  Diabetes mellitus screening -     Hemoglobin A1c; Future   Assessment and Plan    Essential hypertension Blood pressure is well-controlled at 128/86 mmHg. - Continue current antihypertensive medication regimen.  Hypothyroidism Thyroid  function to be assessed with annual blood work. - Ordered thyroid  function tests.  Gastroesophageal reflux disease Continues to use over-the-counter Prilosec daily with no new symptoms reported. - Continue over-the-counter Prilosec as needed.  Meniere's disease Intermittent ear swishing sound, possibly related to Meniere's disease or hearing loss. Symptoms are not constant and occur especially at night when lying down.    Return in about 6 months (around 04/22/2025) for annual physical exam.    Heron CHRISTELLA Sharper, MD

## 2024-10-23 NOTE — Assessment & Plan Note (Signed)
 Current hypertension medications:       Sig   amLODipine  (NORVASC ) 2.5 MG tablet (Taking) Take 1 tablet by mouth once daily   metoprolol  succinate (TOPROL -XL) 100 MG 24 hr tablet (Taking) TAKE 1 TABLET BY MOUTH ONCE DAILY. TAKE WITH OR IMMEDIATELY FOLLOWING A MEAL

## 2024-10-24 ENCOUNTER — Ambulatory Visit: Payer: Self-pay | Admitting: Family Medicine

## 2024-10-24 LAB — COMPREHENSIVE METABOLIC PANEL WITH GFR
ALT: 9 U/L (ref 0–35)
AST: 19 U/L (ref 0–37)
Albumin: 4.2 g/dL (ref 3.5–5.2)
Alkaline Phosphatase: 73 U/L (ref 39–117)
BUN: 13 mg/dL (ref 6–23)
CO2: 26 meq/L (ref 19–32)
Calcium: 9 mg/dL (ref 8.4–10.5)
Chloride: 105 meq/L (ref 96–112)
Creatinine, Ser: 0.71 mg/dL (ref 0.40–1.20)
GFR: 84.51 mL/min (ref >60.00)
Glucose, Bld: 95 mg/dL (ref 70–99)
Potassium: 4 meq/L (ref 3.5–5.1)
Sodium: 141 meq/L (ref 135–145)
Total Bilirubin: 0.5 mg/dL (ref 0.2–1.2)
Total Protein: 6.7 g/dL (ref 6.0–8.3)

## 2024-10-24 LAB — LIPID PANEL
Cholesterol: 121 mg/dL (ref 0–200)
HDL: 58.5 mg/dL (ref >39.00)
LDL Cholesterol: 50 mg/dL (ref 0–99)
NonHDL: 62.48
Total CHOL/HDL Ratio: 2
Triglycerides: 62 mg/dL (ref 0.0–149.0)
VLDL: 12.4 mg/dL (ref 0.0–40.0)

## 2024-10-24 LAB — HEMOGLOBIN A1C: Hgb A1c MFr Bld: 5.7 % (ref 4.6–6.5)

## 2024-11-09 DIAGNOSIS — H938X3 Other specified disorders of ear, bilateral: Secondary | ICD-10-CM | POA: Diagnosis not present

## 2024-11-09 DIAGNOSIS — H8102 Meniere's disease, left ear: Secondary | ICD-10-CM | POA: Diagnosis not present

## 2024-11-09 DIAGNOSIS — H93A2 Pulsatile tinnitus, left ear: Secondary | ICD-10-CM | POA: Diagnosis not present

## 2024-11-09 DIAGNOSIS — H90A22 Sensorineural hearing loss, unilateral, left ear, with restricted hearing on the contralateral side: Secondary | ICD-10-CM | POA: Diagnosis not present

## 2024-11-09 DIAGNOSIS — Z7982 Long term (current) use of aspirin: Secondary | ICD-10-CM | POA: Diagnosis not present

## 2024-11-09 DIAGNOSIS — H9313 Tinnitus, bilateral: Secondary | ICD-10-CM | POA: Diagnosis not present

## 2024-11-09 DIAGNOSIS — Z0110 Encounter for examination of ears and hearing without abnormal findings: Secondary | ICD-10-CM | POA: Diagnosis not present

## 2024-11-09 DIAGNOSIS — H6123 Impacted cerumen, bilateral: Secondary | ICD-10-CM | POA: Diagnosis not present

## 2024-11-09 DIAGNOSIS — Z79899 Other long term (current) drug therapy: Secondary | ICD-10-CM | POA: Diagnosis not present

## 2024-11-09 DIAGNOSIS — H903 Sensorineural hearing loss, bilateral: Secondary | ICD-10-CM | POA: Diagnosis not present

## 2024-11-09 DIAGNOSIS — I252 Old myocardial infarction: Secondary | ICD-10-CM | POA: Diagnosis not present

## 2025-01-03 ENCOUNTER — Ambulatory Visit (INDEPENDENT_AMBULATORY_CARE_PROVIDER_SITE_OTHER)

## 2025-01-03 VITALS — Ht 68.0 in | Wt 150.0 lb

## 2025-01-03 DIAGNOSIS — Z0000 Encounter for general adult medical examination without abnormal findings: Secondary | ICD-10-CM | POA: Diagnosis not present

## 2025-01-03 NOTE — Patient Instructions (Addendum)
 Ms. Hingle,  Thank you for taking the time for your Medicare Wellness Visit. I appreciate your continued commitment to your health goals. Please review the care plan we discussed, and feel free to reach out if I can assist you further.  Please note that Annual Wellness Visits do not include a physical exam. Some assessments may be limited, especially if the visit was conducted virtually. If needed, we may recommend an in-person follow-up with your provider.  Ongoing Care Seeing your primary care provider every 3 to 6 months helps us  monitor your health and provide consistent, personalized care.   Referrals If a referral was made during today's visit and you haven't received any updates within two weeks, please contact the referred provider directly to check on the status.  Recommended Screenings:  Health Maintenance  Topic Date Due   Osteoporosis screening with Bone Density Scan  02/09/2025   COVID-19 Vaccine (8 - Moderna risk 2025-26 season) 04/15/2025   Medicare Annual Wellness Visit  01/03/2026   Breast Cancer Screening  07/27/2026   Cologuard (Stool DNA test)  01/26/2027   DTaP/Tdap/Td vaccine (4 - Tdap) 10/01/2027   Pneumococcal Vaccine for age over 23  Completed   Flu Shot  Completed   Zoster (Shingles) Vaccine  Completed   Hepatitis C Screening  Addressed   Meningitis B Vaccine  Aged Out       01/03/2025   10:41 AM  Advanced Directives  Does Patient Have a Medical Advance Directive? No  Would patient like information on creating a medical advance directive? No - Patient declined    Vision: Annual vision screenings are recommended for early detection of glaucoma, cataracts, and diabetic retinopathy. These exams can also reveal signs of chronic conditions such as diabetes and high blood pressure.  Dental: Annual dental screenings help detect early signs of oral cancer, gum disease, and other conditions linked to overall health, including heart disease and  diabetes.  Please see the attached documents for additional preventive care recommendations.

## 2025-01-03 NOTE — Progress Notes (Signed)
 "  Chief Complaint  Patient presents with   Medicare Wellness     Subjective:   Tracey Giles is a 74 y.o. female who presents for a Medicare Annual Wellness Visit.  Visit info / Clinical Intake: Medicare Wellness Visit Type:: Subsequent Annual Wellness Visit Persons participating in visit and providing information:: patient Medicare Wellness Visit Mode:: Telephone If telephone:: video declined Since this visit was completed virtually, some vitals may be partially provided or unavailable. Missing vitals are due to the limitations of the virtual format.: Documented vitals are patient reported If Telephone or Video please confirm:: I connected with patient using audio/video enable telemedicine. I verified patient identity with two identifiers, discussed telehealth limitations, and patient agreed to proceed. Patient Location:: Home Provider Location:: Office Interpreter Needed?: No Pre-visit prep was completed: yes AWV questionnaire completed by patient prior to visit?: yes Date:: 12/27/24 Living arrangements:: lives with spouse/significant other Patient's Overall Health Status Rating: good Typical amount of pain: some Does pain affect daily life?: no Are you currently prescribed opioids?: no  Dietary Habits and Nutritional Risks How many meals a day?: 3 Eats fruit and vegetables daily?: yes Most meals are obtained by: preparing own meals In the last 2 weeks, have you had any of the following?: none Diabetic:: no  Functional Status Activities of Daily Living (to include ambulation/medication): Independent Ambulation: Independent with device- listed below Home Assistive Devices/Equipment: Eyeglasses; Other (Comment) (Hearing Aids) Medication Administration: Independent Home Management (perform basic housework or laundry): Independent Manage your own finances?: yes Primary transportation is: driving Concerns about vision?: no *vision screening is required for  WTM* Concerns about hearing?: (!) yes Uses hearing aids?: (!) yes Hear whispered voice?: (!) no *in-person visit only*  Fall Screening Falls in the past year?: 0 Number of falls in past year: 0 Was there an injury with Fall?: 0 Fall Risk Category Calculator: 0 Patient Fall Risk Level: Low Fall Risk  Fall Risk Patient at Risk for Falls Due to: No Fall Risks Fall risk Follow up: Falls evaluation completed  Home and Transportation Safety: All rugs have non-skid backing?: N/A, no rugs All stairs or steps have railings?: N/A, no stairs Grab bars in the bathtub or shower?: yes Have non-skid surface in bathtub or shower?: yes Good home lighting?: yes Regular seat belt use?: yes Hospital stays in the last year:: no  Cognitive Assessment Difficulty concentrating, remembering, or making decisions? : no Will 6CIT or Mini Cog be Completed: yes What year is it?: 0 points What month is it?: 0 points Give patient an address phrase to remember (5 components): [address] [place]  About what time is it?: 0 points Count backwards from 20 to 1: 0 points Say the months of the year in reverse: 0 points Repeat the address phrase from earlier: 0 points 6 CIT Score: 0 points  Advance Directives (For Healthcare) Does Patient Have a Medical Advance Directive?: No Would patient like information on creating a medical advance directive?: No - Patient declined  Reviewed/Updated  Reviewed/Updated: Reviewed All (Medical, Surgical, Family, Medications, Allergies, Care Teams, Patient Goals)    Allergies (verified) Tums [calcium  carbonate antacid], Cephalexin, Penicillins, and Sulfamethoxazole-trimethoprim   Current Medications (verified) Outpatient Encounter Medications as of 01/03/2025  Medication Sig   acetaminophen  (TYLENOL ) 500 MG tablet Take 1,000 mg by mouth every 6 (six) hours as needed (pain).   albuterol  (VENTOLIN  HFA) 108 (90 Base) MCG/ACT inhaler Inhale 2 puffs into the lungs  every 6 (six) hours as needed.   alendronate  (  FOSAMAX ) 70 MG tablet Take 1 tablet (70 mg total) by mouth every 7 (seven) days. Take with a full glass of water on an empty stomach.   amLODipine  (NORVASC ) 2.5 MG tablet Take 1 tablet by mouth once daily   aspirin  EC 81 MG EC tablet Take 1 tablet (81 mg total) by mouth daily. Swallow whole.   atorvastatin  (LIPITOR) 10 MG tablet Take 1 tablet by mouth once daily   cholecalciferol (VITAMIN D ) 25 MCG (1000 UNIT) tablet Take 1,000 Units by mouth every morning.   Cyanocobalamin  (B-12) 1000 MCG TABS Take 1,000 mcg by mouth every morning.   FOLIC ACID  PO Take 1 tablet by mouth every morning.   levocetirizine (XYZAL ) 5 MG tablet Take 1 tablet (5 mg total) by mouth every evening.   LORazepam  (ATIVAN ) 0.5 MG tablet Take 0.5-1 tablets (0.25-0.5 mg total) by mouth every 8 (eight) hours as needed for anxiety.   meloxicam  (MOBIC ) 15 MG tablet Take 1 tablet (15 mg total) by mouth daily. (Patient not taking: Reported on 01/03/2025)   metoprolol  succinate (TOPROL -XL) 100 MG 24 hr tablet TAKE 1 TABLET BY MOUTH ONCE DAILY. TAKE WITH OR IMMEDIATELY FOLLOWING A MEAL   omeprazole  (PRILOSEC OTC) 20 MG tablet Take 20 mg by mouth daily as needed (acid reflux/heartburn).   SYNTHROID  75 MCG tablet TAKE 1 TABLET BY MOUTH ONCE DAILY BEFORE BREAKFAST   No facility-administered encounter medications on file as of 01/03/2025.    History: Past Medical History:  Diagnosis Date   Anxiety    Asthma    exercise induced asthma    Essential hypertension    GERD (gastroesophageal reflux disease)    Heart murmur    mitral valve prolapse   History of dermatitis    History of migraine headaches yrs ago, none recent   Hyperlipemia 05/24/2016   Hypothyroidism    Meniere's disease    NSTEMI (non-ST elevated myocardial infarction) (HCC)    a. s/p NSTEMI in 12/2021 with cath showing normal cors and felt to possibly be secondary to spasm.   Palpitations    Rare PVCs by Holter monitor  April 2011    Sleep apnea    Reportedly mild, no cpap needed   Tingling    both legs saw dr vear 04-23-2020 no cause found   Vitamin D  insufficiency 05/24/2016   Past Surgical History:  Procedure Laterality Date   APPENDECTOMY  as child   BREAST LUMPECTOMY  yrs ago   Benign   DILATATION & CURETTAGE/HYSTEROSCOPY WITH MYOSURE N/A 05/07/2020   Procedure: DILATATION & CURETTAGE/HYSTEROSCOPY WITH MYOSURE/POLYP REMOVAL;  Surgeon: Jannis Kate Norris, MD;  Location: [facility];  Service: Gynecology;  Laterality: N/A;  polyp removal   LEFT HEART CATH AND CORONARY ANGIOGRAPHY N/A 12/12/2021   Procedure: LEFT HEART CATH AND CORONARY ANGIOGRAPHY;  Surgeon: Wonda Sharper, MD;  Location: [facility];  Service: Cardiovascular;  Laterality: N/A;   Family History  Problem Relation Age of Onset   Melanoma Father 47   Stroke Mother    Asthma Mother    Stroke Maternal Grandfather    Asthma Brother    Deep vein thrombosis Brother 45       had back surgery with post op complications of blood clots which were fatal   Thyroid  disease Neg Hx    Social History   Occupational History   Occupation: Development Worker, [employer]: [employer]  Tobacco Use   Smoking status: Never   Smokeless tobacco: Never  Vaping Use   Vaping status: Never Used  Substance and Sexual Activity   Alcohol use: Never   Drug use: Never   Sexual activity: Not Currently   Tobacco Counseling Counseling given: No  SDOH Screenings   Food Insecurity: No Food Insecurity (01/03/2025)  Housing: Low Risk (01/03/2025)  Transportation Needs: No Transportation Needs (01/03/2025)  Utilities: Not At Risk (01/03/2025)  Alcohol Screen: Low Risk (12/20/2023)  Depression (PHQ2-9): Low Risk (01/03/2025)  Financial Resource Strain: Low Risk (10/21/2024)  Physical Activity: Insufficiently Active (01/03/2025)  Social Connections: Socially Integrated (01/03/2025)  Stress: No Stress Concern Present (01/03/2025)  Tobacco Use: Low  Risk (01/03/2025)  Health Literacy: Adequate Health Literacy (01/03/2025)   See flowsheets for full screening details  Depression Screen PHQ 2 & 9 Depression Scale- Over the past 2 weeks, how often have you been bothered by any of the following problems? Little interest or pleasure in doing things: 0 Feeling down, depressed, or hopeless (PHQ Adolescent also includes...irritable): 0 PHQ-2 Total Score: 0     Goals Addressed               This Visit's Progress     Increase physical activity (pt-stated)        Get more active!             Objective:    Today's Vitals   01/03/25 1039  Weight: 150 lb (68 kg)  Height: 5' 8 (1.727 m)   Body mass index is 22.81 kg/m.  Hearing/Vision screen Hearing Screening - Comments:: Wears Hearing Aids Vision Screening - Comments:: Wears rx glasses - up to date with routine eye exams with  [facility] Immunizations and Health Maintenance Health Maintenance  Topic Date Due   Bone Density Scan  02/09/2025   COVID-19 Vaccine (8 - Moderna risk 2025-26 season) 04/15/2025   Medicare Annual Wellness (AWV)  01/03/2026   Mammogram  07/27/2026   Fecal DNA (Cologuard)  01/26/2027   DTaP/Tdap/Td (4 - Tdap) 10/01/2027   Pneumococcal Vaccine: 50+ Years  Completed   Influenza Vaccine  Completed   Zoster Vaccines- Shingrix  Completed   Hepatitis C Screening  Addressed   Meningococcal B Vaccine  Aged Out        Assessment/Plan:  This is a routine wellness examination for Kamali.  Patient Care Team: Ozell Heron HERO, MD as PCP - General (Family Medicine) Debera Jayson MATSU, MD as PCP - Cardiology (Cardiology) Thaddeus Locus, MD as Consulting Physician (Otolaryngology) Ivin Kocher, MD as Consulting Physician (Dermatology) Jannis Kate Norris, MD as Consulting Physician (Obstetrics and Gynecology)  I have personally reviewed and noted the following in the patients chart:   Medical and social history Use of alcohol, tobacco  or illicit drugs  Current medications and supplements including opioid prescriptions. Functional ability and status Nutritional status Physical activity Advanced directives List of other physicians Hospitalizations, surgeries, and ER visits in previous 12 months Vitals Screenings to include cognitive, depression, and falls Referrals and appointments  No orders of the defined types were placed in this encounter.  In addition, I have reviewed and discussed with patient certain preventive protocols, quality metrics, and best practice recommendations. A written personalized care plan for preventive services as well as general preventive health recommendations were provided to patient.   Rojelio LELON Blush, LPN   8/71/7973   Return in 53 weeks (on 01/09/2026).  After Visit Summary: (MyChart) Due to this being a telephonic visit, the after visit summary with patients personalized plan was offered to patient  via MyChart   Nurse Notes: No voiced or noted concerns at this time "

## 2025-01-08 ENCOUNTER — Other Ambulatory Visit: Payer: Self-pay | Admitting: Family Medicine

## 2025-01-08 DIAGNOSIS — M810 Age-related osteoporosis without current pathological fracture: Secondary | ICD-10-CM

## 2025-04-24 ENCOUNTER — Encounter: Admitting: Family Medicine

## 2026-01-09 ENCOUNTER — Ambulatory Visit
